# Patient Record
Sex: Female | Born: 1935
Health system: Southern US, Community
[De-identification: ages and names within clinical notes are randomized; demographics above are authoritative.]

## PROBLEM LIST (undated history)

## (undated) DIAGNOSIS — D496 Neoplasm of unspecified behavior of brain: Secondary | ICD-10-CM

## (undated) DIAGNOSIS — G4730 Sleep apnea, unspecified: Secondary | ICD-10-CM

## (undated) DIAGNOSIS — M1990 Unspecified osteoarthritis, unspecified site: Secondary | ICD-10-CM

## (undated) DIAGNOSIS — I10 Essential (primary) hypertension: Secondary | ICD-10-CM

## (undated) DIAGNOSIS — E039 Hypothyroidism, unspecified: Secondary | ICD-10-CM

## (undated) DIAGNOSIS — R002 Palpitations: Secondary | ICD-10-CM

## (undated) DIAGNOSIS — D329 Benign neoplasm of meninges, unspecified: Secondary | ICD-10-CM

## (undated) DIAGNOSIS — R600 Localized edema: Secondary | ICD-10-CM

## (undated) HISTORY — DX: Benign neoplasm of meninges, unspecified: D32.9

## (undated) HISTORY — PX: APPENDECTOMY: SHX54

## (undated) HISTORY — DX: Essential (primary) hypertension: I10

## (undated) HISTORY — PX: TONSILLECTOMY: SUR1361

## (undated) HISTORY — DX: Palpitations: R00.2

## (undated) HISTORY — DX: Neoplasm of unspecified behavior of brain: D49.6

## (undated) HISTORY — PX: TUBAL LIGATION: SHX77

## (undated) HISTORY — DX: Hypothyroidism, unspecified: E03.9

## (undated) HISTORY — DX: Sleep apnea, unspecified: G47.30

## (undated) HISTORY — DX: Localized edema: R60.0

---

## 2002-11-24 ENCOUNTER — Other Ambulatory Visit: Admission: RE | Admit: 2002-11-24 | Discharge: 2002-11-24 | Payer: Self-pay | Admitting: *Deleted

## 2004-09-06 ENCOUNTER — Encounter: Payer: Self-pay | Admitting: Cardiology

## 2004-09-06 ENCOUNTER — Inpatient Hospital Stay (HOSPITAL_COMMUNITY): Admission: EM | Admit: 2004-09-06 | Discharge: 2004-09-07 | Payer: Self-pay | Admitting: Emergency Medicine

## 2004-09-06 ENCOUNTER — Ambulatory Visit: Payer: Self-pay | Admitting: Cardiology

## 2008-03-23 ENCOUNTER — Encounter: Payer: Self-pay | Admitting: Internal Medicine

## 2008-03-26 ENCOUNTER — Encounter: Payer: Self-pay | Admitting: Internal Medicine

## 2008-03-26 ENCOUNTER — Encounter: Admission: RE | Admit: 2008-03-26 | Discharge: 2008-03-26 | Payer: Self-pay | Admitting: *Deleted

## 2008-04-16 ENCOUNTER — Other Ambulatory Visit: Admission: RE | Admit: 2008-04-16 | Discharge: 2008-04-16 | Payer: Self-pay | Admitting: Internal Medicine

## 2008-04-21 ENCOUNTER — Encounter: Payer: Self-pay | Admitting: Internal Medicine

## 2008-04-22 ENCOUNTER — Ambulatory Visit (HOSPITAL_BASED_OUTPATIENT_CLINIC_OR_DEPARTMENT_OTHER): Admission: RE | Admit: 2008-04-22 | Discharge: 2008-04-22 | Payer: Self-pay | Admitting: *Deleted

## 2008-04-22 ENCOUNTER — Encounter: Payer: Self-pay | Admitting: Internal Medicine

## 2008-04-22 DIAGNOSIS — G4730 Sleep apnea, unspecified: Secondary | ICD-10-CM

## 2008-04-22 HISTORY — DX: Sleep apnea, unspecified: G47.30

## 2008-04-23 ENCOUNTER — Encounter: Payer: Self-pay | Admitting: Internal Medicine

## 2008-04-24 ENCOUNTER — Ambulatory Visit: Payer: Self-pay | Admitting: Vascular Surgery

## 2008-04-26 ENCOUNTER — Ambulatory Visit: Payer: Self-pay | Admitting: Internal Medicine

## 2008-05-08 ENCOUNTER — Encounter: Payer: Self-pay | Admitting: Internal Medicine

## 2008-05-08 ENCOUNTER — Ambulatory Visit (HOSPITAL_BASED_OUTPATIENT_CLINIC_OR_DEPARTMENT_OTHER): Admission: RE | Admit: 2008-05-08 | Discharge: 2008-05-08 | Payer: Self-pay | Admitting: *Deleted

## 2008-05-09 ENCOUNTER — Ambulatory Visit: Payer: Self-pay | Admitting: Internal Medicine

## 2008-05-11 ENCOUNTER — Encounter: Payer: Self-pay | Admitting: Internal Medicine

## 2008-05-14 ENCOUNTER — Telehealth: Payer: Self-pay | Admitting: Internal Medicine

## 2008-05-15 ENCOUNTER — Ambulatory Visit: Payer: Self-pay | Admitting: Internal Medicine

## 2008-05-15 DIAGNOSIS — R0989 Other specified symptoms and signs involving the circulatory and respiratory systems: Secondary | ICD-10-CM

## 2008-05-15 DIAGNOSIS — R0609 Other forms of dyspnea: Secondary | ICD-10-CM

## 2008-05-18 ENCOUNTER — Telehealth: Payer: Self-pay | Admitting: Internal Medicine

## 2008-05-18 DIAGNOSIS — D332 Benign neoplasm of brain, unspecified: Secondary | ICD-10-CM | POA: Insufficient documentation

## 2008-05-18 DIAGNOSIS — IMO0002 Reserved for concepts with insufficient information to code with codable children: Secondary | ICD-10-CM | POA: Insufficient documentation

## 2008-05-18 DIAGNOSIS — E039 Hypothyroidism, unspecified: Secondary | ICD-10-CM

## 2008-05-18 DIAGNOSIS — R002 Palpitations: Secondary | ICD-10-CM | POA: Insufficient documentation

## 2008-05-18 DIAGNOSIS — F411 Generalized anxiety disorder: Secondary | ICD-10-CM | POA: Insufficient documentation

## 2008-05-23 ENCOUNTER — Emergency Department (HOSPITAL_COMMUNITY): Admission: EM | Admit: 2008-05-23 | Discharge: 2008-05-23 | Payer: Self-pay | Admitting: Emergency Medicine

## 2008-05-28 ENCOUNTER — Encounter: Payer: Self-pay | Admitting: Internal Medicine

## 2008-06-02 ENCOUNTER — Telehealth: Payer: Self-pay | Admitting: Internal Medicine

## 2008-06-03 DIAGNOSIS — G4733 Obstructive sleep apnea (adult) (pediatric): Secondary | ICD-10-CM | POA: Insufficient documentation

## 2008-06-15 ENCOUNTER — Encounter: Payer: Self-pay | Admitting: Internal Medicine

## 2008-06-16 ENCOUNTER — Telehealth: Payer: Self-pay | Admitting: Internal Medicine

## 2008-06-22 ENCOUNTER — Telehealth: Payer: Self-pay | Admitting: Internal Medicine

## 2008-06-30 ENCOUNTER — Telehealth (INDEPENDENT_AMBULATORY_CARE_PROVIDER_SITE_OTHER): Payer: Self-pay | Admitting: *Deleted

## 2008-07-02 ENCOUNTER — Encounter: Payer: Self-pay | Admitting: Internal Medicine

## 2008-07-13 ENCOUNTER — Telehealth (INDEPENDENT_AMBULATORY_CARE_PROVIDER_SITE_OTHER): Payer: Self-pay | Admitting: *Deleted

## 2010-04-02 ENCOUNTER — Encounter: Payer: Self-pay | Admitting: Internal Medicine

## 2010-06-23 LAB — POCT I-STAT, CHEM 8
HCT: 42 % (ref 36.0–46.0)
Hemoglobin: 14.3 g/dL (ref 12.0–15.0)
Sodium: 142 mEq/L (ref 135–145)
TCO2: 28 mmol/L (ref 0–100)

## 2010-07-26 NOTE — Procedures (Signed)
Brianna Reyes, Brianna Reyes                  ACCOUNT NO.:  [id number]   MEDICAL RECORD NO.:  [id number]          PATIENT TYPE:  OUT   LOCATION:  SLEEP CENTER                 FACILITY:  [facility]   PHYSICIAN:  Clinton D. Maple Hudson, MD, FCCP, FACPDATE OF BIRTH:  Jan 21, 1933   DATE OF STUDY:  05/08/2008                            NOCTURNAL POLYSOMNOGRAM   REFERRING PHYSICIAN:  Rosine Abe, M.D.   INDICATION FOR STUDY:  Insomnia with sleep apnea.   EPWORTH SLEEPINESS SCORE:  Epworth sleepiness score 3/24.  BMI 33.8.  Weight 197 pounds.  Height 64 inches.  Neck 14 inches.   HOME MEDICATION:  Charted and reviewed.   SLEEP ARCHITECTURE:  Diagnostic NPSG protocol ordered.  Total sleep time  107 minutes with sleep efficiency 25.7%.  Stage I was 1.5%.  Stage II  78.5%.  Stage III and REM were absent.  Sleep latency 109 minutes.  Awake after sleep onset 197 minutes.  Arousal index 6.2.  Sleep  architecture was marked by sleep onset after 2300 hours with repeated  intervals of sustained nonspecific wakefulness before final waking  around 04:30 a.m.  She took a natural sleep aid at 10:45 p.m.  She  reportedly had brought Valium, but chose not to take it.   RESPIRATORY DATA:  Apnea-hypopnea index (AHI) 0 per hour.  No scored  events were recorded.   OXYGEN DATA:  The study was reported on 2 liters per minute nasal oxygen  with humidity, as ordered.  Oxygen saturation at a nadir of 94% with  mean oxygen saturation 96.6% through the study.   CARDIAC DATA:  Sinus rhythm with occasional PAC.   MOVEMENT/PARASOMNIA:  No significant movement disturbance.  Bathroom x3.   IMPRESSION/RECOMMENDATIONS:  1. No significant respiratory events associated with sleep      disturbance.  Apnea-hypopnea index 0 per hour, (normal range 0-5      per hour).  Mild snoring with oxygen desaturation to 94% on      supplemental oxygen.  2. Baseline oxygen saturation on 2 liters per minute while awake was      97%.  The study was  done with the patient      wearing nasal oxygen, humidified, at 2 liters per minute as ordered      with oxygen desaturation to a nadir of 94% and mean oxygen      saturation 96.6%.      Clinton D. Maple Hudson, MD, [facility], FACP  Diplomate, Biomedical engineer of Sleep Medicine  Electronically Signed     CDY/MEDQ  D:  05/09/2008 11:42:56  T:  05/09/2008 78:29:56  Job:  [id number]

## 2010-07-26 NOTE — Procedures (Signed)
RENAL ARTERY DUPLEX EVALUATION   INDICATION:  Uncontrolled hypertension.   HISTORY:  Diabetes:  No.  Cardiac:  Arrhythmia.  Hypertension:  Yes.  Smoking:  No.   RENAL ARTERY DUPLEX FINDINGS:  Aorta-Proximal:  92 cm/s  Aorta-Mid:  92 cm/s  Aorta-Distal:  141 cm/s  Celiac Artery Origin:  164 cm/s  SMA Origin:  131 cm/s                                    RIGHT               LEFT  Renal Artery Origin:             108/29 cm/s         119/32 cm/s  Renal Artery Proximal:           141/36 cm/s         63/11 cm/s  Renal Artery Mid:                84/21 cm/s          57/17 cm/s  Renal Artery Distal:             55/13 cm/s          36/9 cm/s  Hilar Acceleration Time (AT):    m/s2                m/s2  Renal-Aortic Ratio (RAR):        1.5                 1.3  Kidney Size:                     10.8 cm x 4.7 cm    10.4 cm x 5.1 cm  End Diastolic Ratio (EDR):       0.34 to 0.26        0.26 to 0.27  Resistive Index (RI):            0.74                0.73   IMPRESSION:  1. Right kidney cyst measures 2.3 cm in diameter.  2. Renal to aortic ratio suggests less than 60% renal artery stenosis      bilaterally.  3. Left kidney is normal with respect to size and shape.  4. Right kidney is normal with respect to size, however, does have a      cyst.  5. Resistive indices suggests mild parenchymal disease.   ___________________________________________  Quita Skye. Hart Rochester, M.D.   MC/MEDQ  D:  04/24/2008  T:  04/24/2008  Job:  [id number]

## 2010-07-26 NOTE — Procedures (Signed)
Brianna Reyes, Brianna Reyes                  ACCOUNT NO.:  [id number]   MEDICAL RECORD NO.:  [id number]          PATIENT TYPE:  OUT   LOCATION:  SLEEP CENTER                 FACILITY:  [facility]   PHYSICIAN:  Clinton D. Maple Hudson, MD, FCCP, FACPDATE OF BIRTH:  01-Jul-1932   DATE OF STUDY:                            NOCTURNAL POLYSOMNOGRAM   REFERRING PHYSICIAN:  Rosine Abe, M.D.   INDICATIONS FOR STUDY:  Hypersomnia with sleep apnea.   EPWORTH SLEEPINESS SCORE:  Epworth sleepiness score 3/24, BMI 33.8,  weight 197 pounds, height 64 inches, and neck 14 inches.   MEDICATIONS:  Home medications charted and reviewed.   SLEEP ARCHITECTURE:  Total sleep time 184 minutes with sleep efficiency  51.3%.  Stage I was 11.9%, stage II 79.7%, stage III absent, REM 8.4% of  total sleep time.  Sleep latency 16 minutes, REM latency 305 minutes,  awake after sleep onset 159 minutes, arousal index 28.6.  No bedtime  medication was taken.  She had sustained intervals of wakefulness of  42/60 minutes between midnight in 1 a.m., between 1:30 and 2:30 a.m.,  and between 3:30 and 4 a.m. consistent with her described home pattern  of recurrent waking.   RESPIRATORY DATA:  Apnea/hypopnea index (AHI) 3.3 per hour.  A total of  10 events was scored including 1 mixed apnea and 9 hypopneas.  All  events were recorded as nonsupine.  She did not have enough events to  meet criteria for split study CPAP protocol on the study night.   OXYGEN DATA:  Mild to moderate snoring with oxygen desaturation to a  nadir of 86%.  Mean oxygen saturation through the study was 90.1% on  room air.  Total of 3.3 minutes was recorded with oxygen saturation less  than 88%.  Technician did not add supplemental oxygen.   CARDIAC DATA:  Sinus rhythm with PACs.   MOVEMENT/PARASOMNIA:  A total of 9 events were associated with arousal  awakening for an index of 2.9 per hour.   IMPRESSION/RECOMMENDATION:  1. Fragmented sleep with frequent  nonspecific waking.  Consider      managing this as an insomnia.  2. Occasional respiratory event with sleep disturbance, AHI 3.3 per      hour.  Events were mostly while nonsupine.  Mild to moderate      snoring with oxygen desaturation to a nadir of 86%.  3. Oxygen saturation nadir on room air was 86% and mean oxygen      saturation was only 90.1% with a total of 3.3      minutes, recorded time of saturation less than 88% on room air.      Consider evaluating for supplemental oxygen for home use especially      during sleep if clinically appropriate.      Clinton D. Maple Hudson, MD, [facility], FACP  Diplomate, Biomedical engineer of Sleep Medicine  Electronically Signed     CDY/MEDQ  D:  04/26/2008 13:51:04  T:  04/27/2008 00:43:49  Job:  [id number]

## 2010-07-29 NOTE — Discharge Summary (Signed)
Brianna Reyes, Brianna Reyes                  ACCOUNT NO.:  [id number]   MEDICAL RECORD NO.:  [id number]          PATIENT TYPE:  INP   LOCATION:  2001                         FACILITY:  [facility]   PHYSICIAN:  Mallory Shirk, MD     DATE OF BIRTH:  1930/07/18   DATE OF ADMISSION:  09/05/2004  DATE OF DISCHARGE:  09/07/2004                                 DISCHARGE SUMMARY   DISCHARGE DIAGNOSES:  1.  Meningioma.  2.  Hypertension.  3.  Hypothyroidism.  4.  Hyperlipidemia.  5.  Possible transient ischemic attack.   MEDICATIONS ON DISCHARGE:  1.  Lisinopril 20 mg p.o. daily.  2.  Levoxyl 75 mg p.o. daily.  3.  Zocor 20 mg p.o. daily.  4.  Enteric-coated aspirin 325 mg p.o. daily.   FOLLOWUP APPOINTMENTS:  1.  Dr. Merri Brunette, primary care physician, within two to three days of      discharge.  2.  Dr. Maeola Harman, neurosurgery. Please call Dr. Fredrich Birks office for an      appointment.   HISTORY OF PRESENT ILLNESS:  Brianna Reyes is a very pleasant 77 year old  Caucasian woman with a past medical history significant for hypertension and  hypothyroidism who developed some increased fatigue on the afternoon of September 05, 2004. The patient was seen completely normal around 7 a.m. the same day.  However, around 6 to 7 p.m. she developed some nausea, had some difficulty  arousing. The patient and husband called their son who was there in 10  minutes and called 9-1-1. The patient's symptoms resolved in the ambulance.  The patient is a known diabetic. She was evaluated in the emergency room,  initial CT scan of the brain showed a moderate size meningioma and was  admitted for further management.   PAST MEDICAL HISTORY:  1.  Hypertension.  2.  Hypothyroidism.  3.  History of tonsillectomy.   MEDICATIONS ON ADMISSION:  1.  Levoxyl 100 mcg p.o. daily.  2.  Lisinopril 10 mg p.o. daily.   ALLERGIES:  No known drug allergies.   PHYSICAL EXAMINATION:  VITAL SIGNS: On admission, vital signs revealed  blood  pressure of 190/94, pulse 70, respirations 20, saturations 93%.  HEENT: Normocephalic and atraumatic. PERRL. Sclerae anicteric. Mucous  membranes moist.  NECK: Supple with no LAD and no JVD.  LUNGS: Clear to auscultation bilaterally.  CVS: S1 and S2. Regular rate and rhythm.  ABDOMEN: Positive bowel sounds. Soft, nontender. No masses.  EXTREMITIES: No clubbing, cyanosis, or edema.   LABORATORY DATA:  WBC 8.0, hemoglobin 15.6, hematocrit 46, platelet count  273,000. Protime 13.6, INR 1.0, PTT 30. Sodium 136, potassium 3.7, chloride  103, glucose 119, BUN 17, creatinine 1.0, alkaline phosphatase 58, AST 23,  ALT 18, albumin 3.9, hemoglobin A1C 5.8, calcium 9.3, magnesium 2.4,  cholesterol 269, triglycerides 109, HDL 48, LDL 199, TSH 0.289.   Vitamin B12 level 632. Folate greater than 20. Urine drug screen negative.  Urinalysis negative. RPR nonreactive.   IMAGING:  CT of the head showed a mass over the high left frontal convexity  consistent with meningioma. MRI of brain showed a 2.5 x 2.4 x 3.5-cm left  posterior frontal parasagittal meningioma with extension into the superior  sagittal sinus. Compromise of lumen of the SSS, no thrombosis as yet. Mild  atrophy and small vessel disease. No evidence for acute stroke. Significant  vasogenic edema.   Echocardiogram showed overall normal left ventricular systolic function with  an ejection fraction of 55% to 65%. The study was inadequate for evaluation  of left ventricular regional wall motion.   Carotid Doppler showed no significant stenosis or plaque, mild calcific  plaques in the proximal ICA. No ICA stenosis. Vertebral artery flow was  antegrade.   HOSPITAL COURSE:  The patient was admitted to a monitored bed.   PROBLEM #1:  Meningioma. The patient was evaluated by Dr. Danae Orleans. Venetia Maxon,  neurosurgery. At the present time the patient is to have this excised at  some later date to be determined by Dr. Venetia Maxon and the family of  the patient  along with primary care physician, Dr. Merri Brunette. The patient's  completely resolved during hospital stay. There were no events of syncope or  altered consciousness. After all the studies were reviewed with the family,  they wanted to take the patient home. A neurology consult was offered which  the family declined at the present time. A discussion was held with Dr.  Renne Crigler, primary care physician, and he agrees with the plan.   PROBLEM #2:  Question of TIA. It is unclear if the patient had a TIA,  however symptoms could be consistent with that since they resolved within  less than a 24-hour period. The patient was started on aspirin.  A  discussion with Dr. Merri Brunette was also held regarding the treatment of  this. The patient has a physician in the family who was wondering if the  patient had a stroke. However, there was no evidence either by imaging or by  physical exam that the patient had any stroke that we could detect. Dr.  Renne Crigler agrees with starting treatment with aspirin for the present time. If  further evaluation is needed, Dr. Renne Crigler will arrange for the patient to be  seen at [facility] or another center for second opinion for surgery as needed as  well.   PROBLEM #3:  Hypertension. The patient is noncompliant with her medications.  She takes her blood pressure medications when she feels she has  hypertension. Unclear how she determines that. She was advised very strongly  to take her medications daily. Her lisinopril has been increased to 20 mg  p.o. daily upon discharge. She has also been advised to not take any herbal  medications since some of these tend to have ephedra which can lead to high  blood pressure. The patient verbalized understanding.   PROBLEM #4:  Hyperlipidemia. The patient's LDL was noted to be 199 with a  total cholesterol of 269, triglycerides 109. The patient has been started on Zocor at 20 mg p.o. daily. The patient verbalized concerns  about Zocor side  effects that she saw on TV. She has been advised to follow up very closely  with Dr. Renne Crigler and further adjustment of Zocor and dose will be done by Dr.  Renne Crigler.   PROBLEM #5:  Hypothyroidism. The patient's TSH was 0.289. Her Synthroid dose  was changed from 100 mcg to 75 mcg p.o. daily. Further evaluation of her  hypothyroidism is deferred to Dr. Renne Crigler.   PROBLEM #6:  The patient was advised  to keep her followup appointments, take  her medications as prescribed, and to return to the emergency department  immediately upon onset of dizziness, chest pain, shortness of breath, or any  other symptoms that might need immediate medical attention.       GDK/MEDQ  D:  09/07/2004  T:  09/07/2004  Job:  [id number]   cc:   Soyla Murphy. Renne Crigler, M.D.  [address]  [place]  [place] [address]  Fax: [phone]

## 2010-07-29 NOTE — Procedures (Signed)
REQUESTING PHYSICIAN:  Isidor Holts, M.D.   PROCEDURES:  Routine EEG done with photic stimulation and hyperventilation.  The patient is described as awake and alert.   CLINICAL HISTORY:  This is a 77 year old woman with history of transient  confusion.  EEG is performed for evaluation of possible seizure.   DESCRIPTION:  The predominant rhythm tracing is seen only in small fragments  and is a low amplitude alpha rhythm of approximately 11 Hertz which  predominates posteriorly and appears without abnormal asymmetry.  Abundant  low amplitude fast activity is seen diffusely throughout the record which  appears without abnormal asymmetry.  No focal slowing is noted.  No  epileptiform discharges are seen.  The patient appeared to have remained in  the wake state throughout the recording.  Photic stimulation produced weak  driving responses.  Hyperventilation produced a little bit more prominence  of the background alpha rhythm with no appearance of abnormalities.  Single  channel devoted to EKG reveals sinus rhythm throughout with a rate of  approximately 60 beats per minutes.   CONCLUSION:  Essentially normal study in the wake state.  Excessive fast  activity as seen in this tracing can be seen in anxious individuals or those  taking medications of the sedative/hypnotic class.  Correlate clinically.  No focal slowing is noted, and no epileptiform discharges are seen.       WUJ:WJXB  D:  09/06/2004 17:39:03  T:  09/07/2004 07:00:23  Job #:  [id number]

## 2010-07-29 NOTE — Consult Note (Signed)
Brianna Reyes, Brianna Reyes                  ACCOUNT NO.:  [id number]   MEDICAL RECORD NO.:  [id number]          PATIENT TYPE:  INP   LOCATION:  2001                         FACILITY:  [facility]   PHYSICIAN:  Danae Orleans. Venetia Maxon, M.D.  DATE OF BIRTH:  [birth year]   DATE OF CONSULTATION:  09/06/2004  DATE OF DISCHARGE:                                   CONSULTATION   REASON FOR CONSULTATION:  Disorientation yesterday evening with newly  diagnosed brain tumor.   HISTORY OF PRESENT ILLNESS:  Brianna Reyes is a 77 year old right-handed woman  with a history of hypertension and hypothyroidism with an episode yesterday  p.m. of disorientation and abnormal speech which was characterized by  receptive aphasia and babbling.  This was self-limited.  The patient's  husband discovered the patient and called an ambulance.  She was brought to  [facility].  She was amnestic for the events and does not recall  the ambulance ride.  This was associated with nausea and vomiting.  She had  no epileptiform events noted or abnormal movements.  She is currently  without complaints.  She does still feel vague nausea.  She denies any  headache.  Head CT demonstrated a left parasagittal meningioma and MRI  confirms a meningioma without significant edema, but with significant local  mass effect.  It abuts the superior sagittal sinus without invasion of the  sinus.  There is no significant shift.   PAST MEDICAL HISTORY:  1.  Hypertension.  2.  Hypothyroidism.   She is on Levoxyl and lisinopril.   REVIEW OF SYSTEMS:  Otherwise unremarkable.  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  She is a nondrinker of alcoholic  beverages and does not smoke tobacco products.   PHYSICAL EXAMINATION:  GENERAL:  She is awake, alert, and conversant.  Speech is clear and fluent speech.  NEUROLOGIC:  Pupils are equal, round, and reactive to light.  Extraocular  movements are intact.  She has no diplopia.  Her  facial motor and sensation  are intact and symmetric.  She has no pronator drift.  She has 5/5 strength  in all motor groups in bilateral upper extremities.  No sensory  abnormalities appreciated.  Cerebellar examination is normal.  Reflexes are  2 in the biceps, triceps, and brachioradialis, 2 at the knees, 2 at the  ankles.  Greater toes are downgoing to plantar stimulation.  She has no  carotid bruits appreciated.  CHEST:  Clear to auscultation.  HEART:  Regular rate and rhythm without murmur.  ABDOMEN:  Soft, nontender with active bowel sounds.   IMPRESSION:  Brianna Reyes is a 77 year old woman with a syncopal event of  unclear etiology.  This may have been a seizure, although there was no motor  findings and tumor is primarily centered over the motor strip.  This  possibly represents an unrelated a transient ischemic attack.  The patient  has a large left parasagittal meningioma  with mass effect.  I would recommend work-up of the syncopal episode with a  carotid duplex  and EEG.  The patient will require craniotomy with resection  of this meningioma in the not too distant future.  I discussed this with the  patient's family and will follow the patient along.       JDS/MEDQ  D:  09/06/2004  T:  09/06/2004  Job:  [id number]

## 2010-07-29 NOTE — Procedures (Signed)
NAMEPINKIE, MANGER                  ACCOUNT NO.:  [id number]   MEDICAL RECORD NO.:  [id number]         PATIENT TYPE:  OUT   LOCATION:  SLEEP CENTER                 FACILITY:  [facility]   PHYSICIAN:  Clinton D. Maple Hudson, MD, FCCP, FACPDATE OF BIRTH:  Sep 30, 1932   DATE OF STUDY:  05/28/2008                            NOCTURNAL POLYSOMNOGRAM   REFERRING PHYSICIAN:  Clinton D. Young, MD, FCCP, FACP   EPWORTH SLEEPINESS SCORE:  Epworth sleepiness score 3/24.  BMI 33.8.  Weight 197 pounds.  Height 64 inches.  Neck 14 inches.   MEDICATIONS:  Home medications are charted and reviewed.   SLEEP ARCHITECTURE:  This was a home study with [employer] portable  equipment recording oxygen saturation respiratory effort with chest and  abdominal excursion, snoring detection, apnea and hypopnea detection,  cardiac rhythm, and the body position.  This was not a technician  monitored study.   RESPIRATORY DATA:  Total sleep time 701.9 minutes.  Apnea-hypopnea index  (AHI) 16.2 per hour.  A 94.9% of total sleep time was recorded with the  patient sleeping supine.  Average oxygen saturation wearing 2 L per  minute nasal prongs was 94.8%.  Minimal oxygen desaturation was recorded  below 81% and virtually all events maintain oxygen saturation greater  than 90% on supplemental oxygen.   HEART RATE:  Mean heart rate 86.9 per minute with minimal of 60 per  minute, maximum 135 beats per minute.  Snoring time 0.   IMPRESSION/RECOMMENDATIONS:  1. Apnea-hypopnea index of 16.2 per hour is consistent with a      diagnosis of moderately severe obstructive sleep apnea/hypopnea      syndrome.  No snoring was recorded and mean oxygen saturation held      94.8% on supplemental oxygen.  2. Previous sleep center monitored studies were compared.  On April 22, 2008, AHI 3.3 per hour with very fragmented sleep and frequent      waking.  On May 08, 2008, apnea-hypopnea index was 0 per hour,      recorded on 2 L per  minute nasal prongs with only 107 minutes of      sleep recorded and sleep efficiency of 25.5%, reflecting anxiety      and inability to maintain sleep.  3. An apnea-hypopnea index of 16.2 per hour would justify therapeutic      trial with continuous positive airway pressure.  This can be      considered clinically.      Clinton D. Maple Hudson, MD, [facility] FACP  Diplomate, Biomedical engineer of Sleep Medicine  Electronically Signed     CDY/MEDQ  D:  05/30/2008 12:10:36  T:  05/31/2008 04:29:44  Job:  [id number]

## 2010-07-29 NOTE — H&P (Signed)
Brianna Reyes, Brianna Reyes                  ACCOUNT NO.:  [id number]   MEDICAL RECORD NO.:  [id number]          PATIENT TYPE:  EMS   LOCATION:  MAJO                         FACILITY:  [facility]   PHYSICIAN:  Hettie Holstein, D.O.    DATE OF BIRTH:  04-07-1930   DATE OF ADMISSION:  09/05/2004  DATE OF DISCHARGE:                                HISTORY & PHYSICAL   PRIMARY CARE PHYSICIAN:  Soyla Murphy. Renne Crigler, M.D.   CHIEF COMPLAINT:  Confusion.   HISTORY OF PRESENT ILLNESS:  Brianna Reyes is a 77 year old Caucasian female  with an unremarkable past medical history only significant for hypertension  and hypothyroidism. She, around 3 o'clock, developed some increased fatigue  and went to bed early. She was last seen as completely normal around 7 a.m.  that morning; however, around 6 to 7 p.m., she developed some nausea and  subsequently some difficulty arousing. Therefore, her husband called their  son who was there in 10 minutes and dispatched the ambulance. Most symptoms  resolved in the ambulance, however. In any event, she is known diabetic, has  no significant medical history. She was evaluated in the emergency  department with an initial CT scan of the brain showing a moderate size  meningioma. She is being admitted for further imaging, management, and  consultation with neurology for final disposition on this finding.   The family requests the neurosurgical services here to be involved as they  are acquainted with Clearence Ped of the service and anticipate contacting  them based on neurologist recommendation.   PAST MEDICAL HISTORY:  1.  Hypertension.  2.  Hypothyroidism.  3.  History of tonsillectomy in the past.   MEDICATIONS:  1.  Levoxyl 100 mcg daily.  2.  Lisinopril 10 mg daily.   ALLERGIES:  No known drug allergies.   SOCIAL HISTORY:  Brianna Reyes has four children. She lives with her husband at  home. She was formerly a Scientist, water quality. She denies tobacco or alcohol. She  is currently  in the middle of publishing a book.   FAMILY HISTORY:  Noncontributory.   LABORATORY DATA:  CT scan as described above revealing a 2.4 x 3.2 cm mass  over the high left convexity compatible with a meningioma. Urinalysis was  negative. Creatinine was 1.0. Hemoglobin 14.5. No other studies were  performed in the department.   ASSESSMENT:  1.  Brain mass/questionable meningioma.  2.  Transient neurologic changes perhaps secondary to the above.  3.  Obesity.   PLAN:  At this time, we are going to admit Brianna Reyes for further neurologic  evaluation and imaging as described above.       ESS/MEDQ  D:  09/06/2004  T:  09/06/2004  Job:  [id number]   cc:   Soyla Murphy. Renne Crigler, M.D.  [address]  [place]  [place] [address]  Fax: [phone]

## 2012-06-13 ENCOUNTER — Emergency Department (HOSPITAL_COMMUNITY): Payer: Medicare Other

## 2012-06-13 ENCOUNTER — Encounter (HOSPITAL_COMMUNITY): Payer: Self-pay | Admitting: Emergency Medicine

## 2012-06-13 ENCOUNTER — Emergency Department (HOSPITAL_COMMUNITY)
Admission: EM | Admit: 2012-06-13 | Discharge: 2012-06-14 | Disposition: A | Discharge status: Home / Self Care | Payer: Medicare Other | Attending: Emergency Medicine | Admitting: Emergency Medicine

## 2012-06-13 DIAGNOSIS — E039 Hypothyroidism, unspecified: Secondary | ICD-10-CM | POA: Insufficient documentation

## 2012-06-13 DIAGNOSIS — Z79899 Other long term (current) drug therapy: Secondary | ICD-10-CM | POA: Insufficient documentation

## 2012-06-13 DIAGNOSIS — R142 Eructation: Secondary | ICD-10-CM | POA: Insufficient documentation

## 2012-06-13 DIAGNOSIS — R141 Gas pain: Secondary | ICD-10-CM | POA: Insufficient documentation

## 2012-06-13 DIAGNOSIS — R140 Abdominal distension (gaseous): Secondary | ICD-10-CM

## 2012-06-13 DIAGNOSIS — G4700 Insomnia, unspecified: Secondary | ICD-10-CM | POA: Insufficient documentation

## 2012-06-13 DIAGNOSIS — I10 Essential (primary) hypertension: Secondary | ICD-10-CM | POA: Insufficient documentation

## 2012-06-13 DIAGNOSIS — Z8679 Personal history of other diseases of the circulatory system: Secondary | ICD-10-CM | POA: Insufficient documentation

## 2012-06-13 DIAGNOSIS — Z86011 Personal history of benign neoplasm of the brain: Secondary | ICD-10-CM | POA: Insufficient documentation

## 2012-06-13 LAB — COMPREHENSIVE METABOLIC PANEL
ALT: 20 U/L (ref 0–35)
AST: 28 U/L (ref 0–37)
Albumin: 3.9 g/dL (ref 3.5–5.2)
Alkaline Phosphatase: 77 U/L (ref 39–117)
Calcium: 10.2 mg/dL (ref 8.4–10.5)
GFR calc Af Amer: 65 mL/min — ABNORMAL LOW (ref >90)
Glucose, Bld: 97 mg/dL (ref 70–99)
Potassium: 3.8 mEq/L (ref 3.5–5.1)
Sodium: 136 mEq/L (ref 135–145)
Total Protein: 7.4 g/dL (ref 6.0–8.3)

## 2012-06-13 LAB — CBC WITH DIFFERENTIAL/PLATELET
Basophils Absolute: 0 10*3/uL (ref 0.0–0.1)
Eosinophils Absolute: 0.3 10*3/uL (ref 0.0–0.7)
Eosinophils Relative: 4 % (ref 0–5)
Lymphs Abs: 2.3 10*3/uL (ref 0.7–4.0)
MCH: 30.6 pg (ref 26.0–34.0)
Neutrophils Relative %: 53 % (ref 43–77)
Platelets: 217 10*3/uL (ref 150–400)
RBC: 4.25 MIL/uL (ref 3.87–5.11)
RDW: 14.9 % (ref 11.5–15.5)
WBC: 7 10*3/uL (ref 4.0–10.5)

## 2012-06-13 NOTE — ED Provider Notes (Signed)
History     CSN: [id number]  Arrival date & time 06/13/12  2153   First MD Initiated Contact with Patient 06/13/12 2304      Chief Complaint  Patient presents with  . Bloated     The history is provided by the patient.   "abdominal thumping" Onset - 2 days ago Intermittent course Course is stable Nothing worsens symptoms Nothing improves symptoms  Pt presents with abdominal thumping She denies cp/sob/weakness No abdominal pain No back pain.  No fever No vomiting.  No change in bowel movements.  No rectal bleeding She has never had this before.  She called her daughter about this.  She looked it up on internet and told her it could be a ruptured aneurysm.  She has no known h/o AAA      Past Medical History  Diagnosis Date  . HTN (hypertension)   . Brain tumor   . Hypothyroidism   . Sleep apnea   . Palpitations   . Meningioma     Past Surgical History  Procedure Laterality Date  . Appendectomy    . Tonsillectomy    . Tubal ligation      Family History  Problem Relation Age of Onset  . Heart disease Brother     ischemic  . Hypertension    . Stroke      History  Substance Use Topics  . Smoking status: Never Smoker   . Smokeless tobacco: Not on file  . Alcohol Use: No    OB History   Grav Para Term Preterm Abortions TAB SAB Ect Mult Living                  Review of Systems  Constitutional: Negative for fever.  Respiratory: Negative for shortness of breath.   Cardiovascular: Negative for chest pain.  Gastrointestinal: Negative for vomiting, abdominal pain and blood in stool.  Musculoskeletal: Negative for back pain.  Neurological: Negative for weakness.  Psychiatric/Behavioral: Negative for agitation.  All other systems reviewed and are negative.    Allergies  Review of patient's allergies indicates no known allergies.  Home Medications   Current Outpatient Rx  Name  Route  Sig  Dispense  Refill  . Ascorbic Acid (VITAMIN C PO)  Oral   Take by mouth.         . beta carotene w/minerals (OCUVITE) tablet   Oral   Take 1 tablet by mouth daily.         Marland Kitchen co-enzyme Q-10 30 MG capsule   Oral   Take 30 mg by mouth 3 (three) times daily.         . Ginger, Zingiber officinalis, (GINGER PO)   Oral   Take by mouth.         . levothyroxine (SYNTHROID, LEVOTHROID) 88 MCG tablet   Oral   Take 88 mcg by mouth daily before breakfast.         . lisinopril-hydrochlorothiazide (PRINZIDE,ZESTORETIC) 20-12.5 MG per tablet   Oral   Take 1 tablet by mouth 2 (two) times daily.          Marland Kitchen MANGANESE PO   Oral   Take by mouth.         . metoprolol (LOPRESSOR) 50 MG tablet   Oral   Take 75 mg by mouth 2 (two) times daily.         Marland Kitchen omega-3 acid ethyl esters (LOVAZA) 1 G capsule   Oral   Take 2 g by  mouth 2 (two) times daily.         Marland Kitchen OVER THE COUNTER MEDICATION   Oral   Take 2 tablets by mouth every morning. Alleviate-natural otc medication for joints         . OVER THE COUNTER MEDICATION   Oral   Take 1 tablet by mouth every morning. Herbal supplements         . OVER THE COUNTER MEDICATION   Oral   Take 1 tablet by mouth every morning. Willow Bark           BP 160/56  Pulse 60  Temp(Src) 97.6 F (36.4 C) (Oral)  Resp 16  SpO2 97%  Physical Exam CONSTITUTIONAL: Well developed/well nourished HEAD: Normocephalic/atraumatic EYES: EOMI/PERRL ENMT: Mucous membranes moist NECK: supple no meningeal signs SPINE:entire spine nontender CV: S1/S2 noted, no murmurs/rubs/gallops noted LUNGS: Lungs are clear to auscultation bilaterally, no apparent distress ABDOMEN: soft, nontender, no rebound or guarding. No abdominal bruit.  No pulsatile mass +BS noted in all quadrants GU:no cva tenderness NEURO: Pt is awake/alert, moves all extremitiesx4 EXTREMITIES: pulses normal, full ROM SKIN: warm, color normal PSYCH: no abnormalities of mood noted  ED Course  Procedures (including critical care  time)  Labs Reviewed  COMPREHENSIVE METABOLIC PANEL - Abnormal; Notable for the following:    BUN 28 (*)    Total Bilirubin 0.2 (*)    GFR calc non Af Amer 56 (*)    GFR calc Af Amer 65 (*)    All other components within normal limits  CBC WITH DIFFERENTIAL  POCT I-STAT TROPONIN I   Dg Abd Acute W/chest  06/13/2012  *RADIOLOGY REPORT*  Clinical Data: Bloated  ACUTE ABDOMEN SERIES (ABDOMEN 2 VIEW & CHEST 1 VIEW)  Comparison: None.  Findings: Normal heart size.  Bibasilar atelectasis.  No free intraperitoneal gas.  No disproportionate dilatation of bowel. Vascular calcifications.  IMPRESSION: Bibasilar atelectasis.  Nonobstructive bowel gas pattern.   Original Report Authenticated By: Jolaine Click, M.D.    Pt well appearing She has no tenderness and I doubt she has acute ruptured AAA given her exam.  Advised need to call her PCP tomorrow and can further pursue workup as outpatient.  Patient agreeable   MDM  Nursing notes including past medical history and social history reviewed and considered in documentation xrays reviewed and considered Labs/vital reviewed and considered        Date: 06/13/2012  Rate: 57  Rhythm: sinus bradycardia  QRS Axis: normal  Intervals: normal  ST/T Wave abnormalities: normal  Conduction Disutrbances:none  Narrative Interpretation:   Old EKG Reviewed: none available AT TIME OF INTERPRETATION    Joya Gaskins, MD 06/14/12 0003

## 2012-06-13 NOTE — ED Notes (Signed)
PT. REPORTS ABDOMINAL " THUMPING" SENSATION / FEELS FULL FOR SEVERAL DAYS , DENIES CHEST PAIN , NO SOB , NO NAUSEA OR VOMITTING .

## 2012-06-14 NOTE — ED Notes (Signed)
Pt denies any pain or questions upon discharge. 

## 2012-08-21 ENCOUNTER — Encounter: Payer: Self-pay | Admitting: *Deleted

## 2012-08-21 ENCOUNTER — Telehealth: Payer: Self-pay | Admitting: *Deleted

## 2012-08-21 NOTE — Telephone Encounter (Signed)
Spoke with patient informing her that the last download that we received from [facility] shows tat she her sleep apnea is still not controlled. We ned for her to come to the June 17th sleep clinic. Spoke with Inetta Fermo to have her to put patient on the schedule @ 9:30. Patient was instructed to bring her sleep machine.

## 2012-08-26 ENCOUNTER — Encounter: Payer: Self-pay | Admitting: Cardiovascular Disease

## 2012-08-27 ENCOUNTER — Ambulatory Visit (INDEPENDENT_AMBULATORY_CARE_PROVIDER_SITE_OTHER): Payer: Medicare Other | Admitting: Cardiovascular Disease

## 2012-08-27 ENCOUNTER — Encounter: Payer: Self-pay | Admitting: Cardiovascular Disease

## 2012-08-27 VITALS — BP 148/80 | HR 62 | Ht 64.0 in | Wt 205.6 lb

## 2012-08-27 DIAGNOSIS — R002 Palpitations: Secondary | ICD-10-CM

## 2012-08-27 DIAGNOSIS — G4733 Obstructive sleep apnea (adult) (pediatric): Secondary | ICD-10-CM

## 2012-08-27 DIAGNOSIS — IMO0002 Reserved for concepts with insufficient information to code with codable children: Secondary | ICD-10-CM

## 2012-08-27 NOTE — Patient Instructions (Signed)
Your physician recommends that you schedule a follow-up appointment in: 6 MONTHS 

## 2012-08-27 NOTE — Progress Notes (Signed)
Patient ID: Brianna Reyes, female   DOB: 09-Mar-1931, 79 y.o.   MRN: [id number]   HPI: Brianna Reyes, is a 79 y.o. female who presents to the office today for sleep clinic evaluation.  Brianna Reyes has a history of complex sleep apnea, palpitations, hypertension, hyperlipidemia, hypothyroidism, as well as obesity. She has been meeting Medicare compliance standard was not doing well until she was switched to BiPAP therapy for her sleep apnea appear most recently, she has been set at a fixed BiPAP pressure of 22 and an EPAP pressure of 17. Clinically, she notes dramatic improvement from how she felt before. She states that she is no longer having nocturnal tachycardia palpitations. She denies residual daytime sleepiness. She has poor energy. She is unaware of any significant leak or breakthrough snoring. A new download was obtained from 06/27/2012 through 08/21/2012. She is using a 100% of the days and averaging almost 7 hours per night. However, despite her marked improvement clinically and subjectively, objective leak on his download her AHI was still elevated but improved at 25.2. Interrogation of her BiPAP although filled for his chronic unit goals that she is starting at 15/10 with her initial ramp pressure and then it increases to the fixed settings.    Epworth Sleepiness Scale: Situation   Chance of Dozing/Sleeping (0 = never , 1 = slight chance , 2 = moderate chance , 3 = high chance )   sitting and reading 0   watching TV 0   sitting inactive in a public place 0   being a passenger in a motor vehicle for an hour or more 1   lying down in the afternoon 2   sitting and talking to someone 0   sitting quietly after lunch (no alcohol) 0   while stopped for a few minutes in traffic as the driver 0   Total Score  3    Past Medical History  Diagnosis Date  . Brain tumor   . Hypothyroidism   . Sleep apnea 04/22/08    SLEEP STUDY done @ [facility]. Read by Dr. Fannie Reyes. Repeat study done  05/22/11 @ [facility]  . Palpitations 10/24/11    cardionet monitor worn  . Meningioma   . HTN (hypertension) 10/24/11    ECHO; EF=> 55% tHERE IS MILD CONCENTRIC LV HYPERTROPHY. LV SYSTOLIC FUNCTION IS NORMAL. THE TRANSMITRAL SPECTRAL DOPPLER FLOW PATTERN SUGGESTIVE OF PSEUDONORMALIATION.    Past Surgical History  Procedure Laterality Date  . Appendectomy    . Tonsillectomy    . Tubal ligation      No Known Allergies  Current Outpatient Prescriptions  Medication Sig Dispense Refill  . Ascorbic Acid (VITAMIN C PO) Take by mouth.      . beta carotene w/minerals (OCUVITE) tablet Take 1 tablet by mouth daily.      Marland Kitchen co-enzyme Q-10 30 MG capsule Take 30 mg by mouth 3 (three) times daily.      . Ginger, Zingiber officinalis, (GINGER PO) Take by mouth.      . hydrALAZINE (APRESOLINE) 25 MG tablet 25 mg. Take 1/2-1 po prn      . levothyroxine (SYNTHROID, LEVOTHROID) 88 MCG tablet Take 88 mcg by mouth daily before breakfast.      . lisinopril-hydrochlorothiazide (PRINZIDE,ZESTORETIC) 20-12.5 MG per tablet Take 1 tablet by mouth 2 (two) times daily.       Marland Kitchen MANGANESE PO Take by mouth.      . metoprolol (LOPRESSOR)  50 MG tablet Take 75 mg by mouth 2 (two) times daily.      Marland Kitchen omega-3 acid ethyl esters (LOVAZA) 1 G capsule Take 2 g by mouth 2 (two) times daily.      Marland Kitchen OVER THE COUNTER MEDICATION Take 2 tablets by mouth every morning. Alleviate-natural otc medication for joints      . OVER THE COUNTER MEDICATION Take 1 tablet by mouth every morning. Herbal supplements      . OVER THE COUNTER MEDICATION Take 1 tablet by mouth every morning. [facility]       No current facility-administered medications for this visit.    Social she is widowed. She has 4 children 6 grandchildren. There is no tobacco row call use. She does not routinely exercise.  ROS is negative for fever chills or night sweats. She denies any residual daytime sleepiness. She is unaware of breakthrough  snoring. She denies bruxism. She denies restless legs. She denies any recent palpitations. She states her blood pressure at home typically is in the normal range at approximately 110-120 systolically. She has more energy she denies tremors. She denies rash. Other system review is negative.  PE BP 148/80  Pulse 62  Ht 5\' 4"  (1.626 m)  Wt 205 lb 9.6 oz (93.26 kg)  BMI 35.27 kg/m2  General: Alert, oriented, no distress.  HEENT: Normocephalic, atraumatic. Pupils round and reactive; sclera anicteric;  Nose without nasal septal hypertrophy Mouth/Parynx benign; Mallinpatti scale 3 Neck: No JVD, no carotid briuts Lungs: clear to ausculatation and percussion; no wheezing or rales Heart: RRR, s1 s2 normal 1/6 systolic murmur Abdomen: soft, nontender; no hepatosplenomehaly, BS+; abdominal aorta nontender and not dilated by palpation. Pulses 2+ Extremities: no clubbinbg cyanosis or edema, Homan's sign negative  Neurologic: grossly nonfocal    LABS:  BMET    Component Value Date/Time   NA 136 06/13/2012 2218   K 3.8 06/13/2012 2218   CL 99 06/13/2012 2218   CO2 27 06/13/2012 2218   GLUCOSE 97 06/13/2012 2218   BUN 28* 06/13/2012 2218   CREATININE 0.94 06/13/2012 2218   CALCIUM 10.2 06/13/2012 2218   GFRNONAA 56* 06/13/2012 2218   GFRAA 65* 06/13/2012 2218     Hepatic Function Panel     Component Value Date/Time   PROT 7.4 06/13/2012 2218   ALBUMIN 3.9 06/13/2012 2218   AST 28 06/13/2012 2218   ALT 20 06/13/2012 2218   ALKPHOS 77 06/13/2012 2218   BILITOT 0.2* 06/13/2012 2218     CBC    Component Value Date/Time   WBC 7.0 06/13/2012 2218   RBC 4.25 06/13/2012 2218   HGB 13.0 06/13/2012 2218   HCT 38.0 06/13/2012 2218   PLT 217 06/13/2012 2218   MCV 89.4 06/13/2012 2218   MCH 30.6 06/13/2012 2218   MCHC 34.2 06/13/2012 2218   RDW 14.9 06/13/2012 2218   LYMPHSABS 2.3 06/13/2012 2218   MONOABS 0.7 06/13/2012 2218   EOSABS 0.3 06/13/2012 2218   BASOSABS 0.0 06/13/2012 2218     BNP No results found for this basename:  probnp    Lipid Panel  No results found for this basename: chol, trig, hdl, cholhdl, vldl, ldlcalc     RADIOLOGY: No results found.    ASSESSMENT AND PLAN: I reviewed Brianna Reyes most recent download it appears that she did develop 3 central apneic events and her pressure of 22/17. Although or HI is elevated clinically she feels markedly improved and notes tremendous difference on her current  BiPAP when compared to her CPAP therapy. We did discuss potential alternatives to treatment. At this point, it does not appear that she is having any significant mask leak to account for her elevated AHI. I elected to further adjust her pressure support delta and will change her to a BiPAP although rather than fixed pressure settings. We will start her at 16/10 this can increase to a maximum if necessary of 25/19. We will obtain a new download in 30 days. Hopefully this will objectively show improvement. However, if she is noted to have central events continued elevation of her HI, she will then be referred for a BiPAP Auto SV titration.  Her blood pressure again show some lability she states her blood pressure at home has been in the 110-120 range systolically. Her palpitations have resolved with treatment. As long as she remains stable, I will see her in 6 months for reassessment     Lennette Bihari, MD, [facility]  08/27/2012 11:03 AM

## 2012-09-16 ENCOUNTER — Other Ambulatory Visit: Payer: Self-pay

## 2012-09-16 MED ORDER — METOPROLOL TARTRATE 50 MG PO TABS: 75.0000 mg | ORAL_TABLET | Freq: Two times a day (BID) | ORAL | Status: DC

## 2012-09-16 NOTE — Telephone Encounter (Signed)
Rx was sent to pharmacy electronically. 

## 2012-09-18 ENCOUNTER — Encounter (HOSPITAL_COMMUNITY): Payer: Self-pay | Admitting: Emergency Medicine

## 2012-09-18 DIAGNOSIS — A498 Other bacterial infections of unspecified site: Secondary | ICD-10-CM | POA: Diagnosis present

## 2012-09-18 DIAGNOSIS — M129 Arthropathy, unspecified: Secondary | ICD-10-CM | POA: Diagnosis present

## 2012-09-18 DIAGNOSIS — E876 Hypokalemia: Secondary | ICD-10-CM | POA: Diagnosis present

## 2012-09-18 DIAGNOSIS — E039 Hypothyroidism, unspecified: Secondary | ICD-10-CM | POA: Diagnosis present

## 2012-09-18 DIAGNOSIS — N12 Tubulo-interstitial nephritis, not specified as acute or chronic: Principal | ICD-10-CM | POA: Diagnosis present

## 2012-09-18 DIAGNOSIS — Z79899 Other long term (current) drug therapy: Secondary | ICD-10-CM

## 2012-09-18 DIAGNOSIS — Z823 Family history of stroke: Secondary | ICD-10-CM

## 2012-09-18 DIAGNOSIS — Z8249 Family history of ischemic heart disease and other diseases of the circulatory system: Secondary | ICD-10-CM

## 2012-09-18 DIAGNOSIS — E871 Hypo-osmolality and hyponatremia: Secondary | ICD-10-CM | POA: Diagnosis present

## 2012-09-18 DIAGNOSIS — G4730 Sleep apnea, unspecified: Secondary | ICD-10-CM | POA: Diagnosis present

## 2012-09-18 DIAGNOSIS — I10 Essential (primary) hypertension: Secondary | ICD-10-CM | POA: Diagnosis present

## 2012-09-18 DIAGNOSIS — D320 Benign neoplasm of cerebral meninges: Secondary | ICD-10-CM | POA: Diagnosis present

## 2012-09-18 LAB — COMPREHENSIVE METABOLIC PANEL
ALT: 21 U/L (ref 0–35)
AST: 24 U/L (ref 0–37)
CO2: 27 mEq/L (ref 19–32)
Calcium: 9.3 mg/dL (ref 8.4–10.5)
Chloride: 96 mEq/L (ref 96–112)
Creatinine, Ser: 0.89 mg/dL (ref 0.50–1.10)
GFR calc Af Amer: 69 mL/min — ABNORMAL LOW (ref >90)
GFR calc non Af Amer: 60 mL/min — ABNORMAL LOW (ref >90)
Glucose, Bld: 110 mg/dL — ABNORMAL HIGH (ref 70–99)
Total Bilirubin: 0.6 mg/dL (ref 0.3–1.2)

## 2012-09-18 LAB — CBC WITH DIFFERENTIAL/PLATELET
Basophils Absolute: 0 10*3/uL (ref 0.0–0.1)
Eosinophils Relative: 1 % (ref 0–5)
HCT: 37.4 % (ref 36.0–46.0)
Hemoglobin: 12.7 g/dL (ref 12.0–15.0)
Lymphocytes Relative: 7 % — ABNORMAL LOW (ref 12–46)
Lymphs Abs: 0.6 10*3/uL — ABNORMAL LOW (ref 0.7–4.0)
MCV: 91.2 fL (ref 78.0–100.0)
Monocytes Absolute: 0.6 10*3/uL (ref 0.1–1.0)
Neutro Abs: 6.5 10*3/uL (ref 1.7–7.7)
RBC: 4.1 MIL/uL (ref 3.87–5.11)
RDW: 14.4 % (ref 11.5–15.5)
WBC: 7.7 10*3/uL (ref 4.0–10.5)

## 2012-09-18 MED ORDER — ACETAMINOPHEN 325 MG PO TABS
650.0000 mg | ORAL_TABLET | Freq: Once | ORAL | Status: AC
  Administered 2012-09-18: 650 mg via ORAL
  Filled 2012-09-18: qty 2

## 2012-09-18 NOTE — ED Notes (Signed)
PT. REPORTS FEVER WITH CHILLS " I'M SHAKING " ONSET THIS EVENING WITH DYSURIA , CURRENTLY TAKING AMOXICILLIN FOR CYSTITIS PRESCRIBED BY DR. PHARR WITH NO IMPROVEMENT.

## 2012-09-19 ENCOUNTER — Inpatient Hospital Stay (HOSPITAL_COMMUNITY): Payer: Medicare Other

## 2012-09-19 ENCOUNTER — Encounter (HOSPITAL_COMMUNITY): Payer: Self-pay | Admitting: *Deleted

## 2012-09-19 ENCOUNTER — Inpatient Hospital Stay (HOSPITAL_COMMUNITY)
Admission: EM | Admit: 2012-09-19 | Discharge: 2012-09-21 | DRG: 690 | Disposition: A | Discharge status: Home / Self Care | Payer: Medicare Other | Attending: Internal Medicine | Admitting: Internal Medicine

## 2012-09-19 DIAGNOSIS — G4733 Obstructive sleep apnea (adult) (pediatric): Secondary | ICD-10-CM

## 2012-09-19 DIAGNOSIS — R509 Fever, unspecified: Secondary | ICD-10-CM

## 2012-09-19 DIAGNOSIS — D332 Benign neoplasm of brain, unspecified: Secondary | ICD-10-CM

## 2012-09-19 DIAGNOSIS — N390 Urinary tract infection, site not specified: Secondary | ICD-10-CM | POA: Diagnosis present

## 2012-09-19 DIAGNOSIS — IMO0002 Reserved for concepts with insufficient information to code with codable children: Secondary | ICD-10-CM | POA: Diagnosis present

## 2012-09-19 DIAGNOSIS — R002 Palpitations: Secondary | ICD-10-CM

## 2012-09-19 DIAGNOSIS — R0609 Other forms of dyspnea: Secondary | ICD-10-CM

## 2012-09-19 DIAGNOSIS — F411 Generalized anxiety disorder: Secondary | ICD-10-CM

## 2012-09-19 DIAGNOSIS — E039 Hypothyroidism, unspecified: Secondary | ICD-10-CM | POA: Diagnosis present

## 2012-09-19 DIAGNOSIS — N12 Tubulo-interstitial nephritis, not specified as acute or chronic: Secondary | ICD-10-CM | POA: Diagnosis present

## 2012-09-19 HISTORY — DX: Unspecified osteoarthritis, unspecified site: M19.90

## 2012-09-19 LAB — URINALYSIS, ROUTINE W REFLEX MICROSCOPIC
Glucose, UA: NEGATIVE mg/dL
Protein, ur: NEGATIVE mg/dL
Specific Gravity, Urine: 1.012 (ref 1.005–1.030)
pH: 5.5 (ref 5.0–8.0)

## 2012-09-19 LAB — CBC
HCT: 33.2 % — ABNORMAL LOW (ref 36.0–46.0)
Hemoglobin: 11.1 g/dL — ABNORMAL LOW (ref 12.0–15.0)
MCHC: 33.4 g/dL (ref 30.0–36.0)
MCV: 91.2 fL (ref 78.0–100.0)
WBC: 9.3 10*3/uL (ref 4.0–10.5)

## 2012-09-19 LAB — BASIC METABOLIC PANEL
BUN: 24 mg/dL — ABNORMAL HIGH (ref 6–23)
Chloride: 98 mEq/L (ref 96–112)
Creatinine, Ser: 0.95 mg/dL (ref 0.50–1.10)
Glucose, Bld: 125 mg/dL — ABNORMAL HIGH (ref 70–99)
Potassium: 3.4 mEq/L — ABNORMAL LOW (ref 3.5–5.1)

## 2012-09-19 LAB — URINE MICROSCOPIC-ADD ON

## 2012-09-19 LAB — CG4 I-STAT (LACTIC ACID): Lactic Acid, Venous: 0.78 mmol/L (ref 0.5–2.2)

## 2012-09-19 MED ORDER — DEXTROSE 5 % IV SOLN: 1.0000 g | Freq: Once | INTRAVENOUS | Status: DC

## 2012-09-19 MED ORDER — LISINOPRIL-HYDROCHLOROTHIAZIDE 20-12.5 MG PO TABS: 1.0000 | ORAL_TABLET | Freq: Two times a day (BID) | ORAL | Status: DC

## 2012-09-19 MED ORDER — SODIUM CHLORIDE 0.9 % IV SOLN
INTRAVENOUS | Status: DC
  Administered 2012-09-19 – 2012-09-20 (×3): via INTRAVENOUS

## 2012-09-19 MED ORDER — LEVOTHYROXINE SODIUM 88 MCG PO TABS
88.0000 ug | ORAL_TABLET | Freq: Every day | ORAL | Status: DC
  Administered 2012-09-19 – 2012-09-21 (×3): 88 ug via ORAL
  Filled 2012-09-19 (×4): qty 1

## 2012-09-19 MED ORDER — HEPARIN SODIUM (PORCINE) 5000 UNIT/ML IJ SOLN
5000.0000 [IU] | Freq: Three times a day (TID) | INTRAMUSCULAR | Status: DC
  Filled 2012-09-19 (×5): qty 1

## 2012-09-19 MED ORDER — LISINOPRIL 20 MG PO TABS
20.0000 mg | ORAL_TABLET | Freq: Every day | ORAL | Status: DC
  Administered 2012-09-19 – 2012-09-21 (×3): 20 mg via ORAL
  Filled 2012-09-19 (×3): qty 1

## 2012-09-19 MED ORDER — SODIUM CHLORIDE 0.9 % IV SOLN
Freq: Once | INTRAVENOUS | Status: AC
  Administered 2012-09-19: 01:00:00 via INTRAVENOUS

## 2012-09-19 MED ORDER — LORAZEPAM 0.5 MG PO TABS: 0.5000 mg | ORAL_TABLET | Freq: Once | ORAL | Status: AC | PRN

## 2012-09-19 MED ORDER — METOPROLOL TARTRATE 50 MG PO TABS
75.0000 mg | ORAL_TABLET | Freq: Two times a day (BID) | ORAL | Status: DC
  Administered 2012-09-19 – 2012-09-21 (×5): 75 mg via ORAL
  Filled 2012-09-19 (×7): qty 1

## 2012-09-19 MED ORDER — ACETAMINOPHEN 325 MG PO TABS
650.0000 mg | ORAL_TABLET | Freq: Four times a day (QID) | ORAL | Status: DC | PRN
  Administered 2012-09-21: 650 mg via ORAL
  Filled 2012-09-19: qty 2

## 2012-09-19 MED ORDER — CIPROFLOXACIN IN D5W 400 MG/200ML IV SOLN
400.0000 mg | Freq: Two times a day (BID) | INTRAVENOUS | Status: DC
  Administered 2012-09-19 – 2012-09-21 (×6): 400 mg via INTRAVENOUS
  Filled 2012-09-19 (×7): qty 200

## 2012-09-19 NOTE — ED Provider Notes (Signed)
History    CSN: [id number] Arrival date & time 09/18/12  2246  First MD Initiated Contact with Patient 09/19/12 0020     Chief Complaint  Patient presents with  . Fever   (Consider location/radiation/quality/duration/timing/severity/associated sxs/prior Treatment) HPI 79 yo female presents to the ER with complaint of shaking chills.  Pt has been on amoxicillin after having sxs of UTI over the weekend.  Pt had left over amoxicillin from dental procedure, was told by her pcm to continue the medication.  She started having shaking chills this afternoon, did not know she had a fever until she came to the ER.  She reports nausea and back pain as well.  No cough, no rash, no tick bites, no headache, no neck stiffness.  No abd pain, no change in bowel habits.  Past Medical History  Diagnosis Date  . Brain tumor   . Hypothyroidism   . Sleep apnea 04/22/08    SLEEP STUDY done @ [facility]. Read by Dr. Fannie Knee. Repeat study done 05/22/11 @ [facility].  . Palpitations 10/24/11    cardionet monitor worn  . Meningioma   . HTN (hypertension) 10/24/11    ECHO; EF=> 55% tHERE IS MILD CONCENTRIC LV HYPERTROPHY. LV SYSTOLIC FUNCTION IS NORMAL. THE TRANSMITRAL SPECTRAL DOPPLER FLOW PATTERN SUGGESTIVE OF PSEUDONORMALIATION.  Marland Kitchen Sleep apnea   . Arthritis    Past Surgical History  Procedure Laterality Date  . Appendectomy    . Tonsillectomy    . Tubal ligation     Family History  Problem Relation Age of Onset  . Heart disease Brother     ischemic  . Hypertension    . Stroke     History  Substance Use Topics  . Smoking status: Never Smoker   . Smokeless tobacco: Not on file  . Alcohol Use: No   OB History   Grav Para Term Preterm Abortions TAB SAB Ect Mult Living                 Review of Systems  See History of Present Illness; otherwise all other systems are reviewed and negative  Allergies  Review of patient's allergies indicates no known allergies.  Home  Medications   Current Outpatient Rx  Name  Route  Sig  Dispense  Refill  . Ascorbic Acid (VITAMIN C PO)   Oral   Take by mouth.         . beta carotene w/minerals (OCUVITE) tablet   Oral   Take 1 tablet by mouth daily.         Marland Kitchen co-enzyme Q-10 30 MG capsule   Oral   Take 30 mg by mouth 3 (three) times daily.         . Ginger, Zingiber officinalis, (GINGER PO)   Oral   Take by mouth.         . hydrALAZINE (APRESOLINE) 25 MG tablet      25 mg. Take 1/2-1 po prn         . levothyroxine (SYNTHROID, LEVOTHROID) 88 MCG tablet   Oral   Take 88 mcg by mouth daily before breakfast.         . lisinopril-hydrochlorothiazide (PRINZIDE,ZESTORETIC) 20-12.5 MG per tablet   Oral   Take 1 tablet by mouth 2 (two) times daily.          Marland Kitchen MANGANESE PO   Oral   Take by mouth.         . metoprolol (  LOPRESSOR) 50 MG tablet   Oral   Take 1.5 tablets (75 mg total) by mouth 2 (two) times daily.   270 tablet   3   . omega-3 acid ethyl esters (LOVAZA) 1 G capsule   Oral   Take 2 g by mouth 2 (two) times daily.         Marland Kitchen OVER THE COUNTER MEDICATION   Oral   Take 2 tablets by mouth every morning. Alleviate-natural otc medication for joints         . OVER THE COUNTER MEDICATION   Oral   Take 1 tablet by mouth every morning. Herbal supplements         . OVER THE COUNTER MEDICATION   Oral   Take 1 tablet by mouth every morning. Willow Bark          BP 110/38  Pulse 73  Temp(Src) 98.6 F (37 C) (Oral)  Resp 16  SpO2 94% Physical Exam  Nursing note and vitals reviewed. Constitutional: She is oriented to person, place, and time. She appears well-developed and well-nourished.  HENT:  Head: Normocephalic and atraumatic.  Nose: Nose normal.  Mouth/Throat: Oropharynx is clear and moist.  Eyes: Conjunctivae and EOM are normal. Pupils are equal, round, and reactive to light.  Neck: Normal range of motion. Neck supple. No JVD present. No tracheal deviation  present. No thyromegaly present.  Cardiovascular: Normal rate, regular rhythm, normal heart sounds and intact distal pulses.  Exam reveals no gallop and no friction rub.   No murmur heard. Pulmonary/Chest: Effort normal and breath sounds normal. No stridor. No respiratory distress. She has no wheezes. She has no rales. She exhibits no tenderness.  Abdominal: Soft. Bowel sounds are normal. She exhibits no distension and no mass. There is no tenderness. There is no rebound and no guarding.  CVA tenderness bilaterally  Musculoskeletal: Normal range of motion. She exhibits no edema and no tenderness.  Lymphadenopathy:    She has no cervical adenopathy.  Neurological: She is alert and oriented to person, place, and time. No cranial nerve deficit. She exhibits normal muscle tone. Coordination normal.  Skin: Skin is warm and dry. No rash noted. No erythema. No pallor.  Psychiatric: She has a normal mood and affect. Her behavior is normal. Judgment and thought content normal.    ED Course  Procedures (including critical care time) Labs Reviewed  URINALYSIS, ROUTINE W REFLEX MICROSCOPIC - Abnormal; Notable for the following:    APPearance CLOUDY (*)    Hgb urine dipstick SMALL (*)    Nitrite POSITIVE (*)    Leukocytes, UA LARGE (*)    All other components within normal limits  CBC WITH DIFFERENTIAL - Abnormal; Notable for the following:    Neutrophils Relative % 84 (*)    Lymphocytes Relative 7 (*)    Lymphs Abs 0.6 (*)    All other components within normal limits  COMPREHENSIVE METABOLIC PANEL - Abnormal; Notable for the following:    Sodium 133 (*)    Glucose, Bld 110 (*)    BUN 24 (*)    GFR calc non Af Amer 60 (*)    GFR calc Af Amer 69 (*)    All other components within normal limits  URINE MICROSCOPIC-ADD ON - Abnormal; Notable for the following:    Bacteria, UA MANY (*)    All other components within normal limits  URINE CULTURE  CG4 I-STAT (LACTIC ACID)   No results found. 1.  Urinary tract infection  2. Fever     MDM  79 year old female with urinary tract infection, concern for early pyelonephritis.  Patient has been on amoxicillin, without improvement in symptoms.  Will start on ciprofloxacin.  Patient, reports she's taken this past without difficulties.  Will discuss with hospitalist for admission given her systemic symptoms.  Olivia Mackie, MD 09/19/12 [phone]

## 2012-09-19 NOTE — Evaluation (Signed)
Physical Therapy Evaluation Patient Details Name: Brianna Reyes MRN: [id number] DOB: 07/19/1930 Today's Date: 09/19/2012 Time: 7829-5621 PT Time Calculation (min): 10 min  PT Assessment / Plan / Recommendation History of Present Illness  79 yo adm with fever and UTI. Pt with h/o brain tumor, HTN, and sleep apnea with CPAP.  Clinical Impression  Patient evaluated by Physical Therapy with no further acute PT needs identified. The patient has no further questions. Son present throughout evaluation and states he has no concerns regarding her mobility and ability to take care of herself on d/c. See below for any follow-up Physial Therapy or equipment needs. PT is signing off. Thank you for this referral.     PT Assessment  Patent does not need any further PT services    Follow Up Recommendations  No PT follow up    Does the patient have the potential to tolerate intense rehabilitation      Barriers to Discharge        Equipment Recommendations  None recommended by PT    Recommendations for Other Services     Frequency      Precautions / Restrictions     Pertinent Vitals/Pain Denied pain      Mobility  Bed Mobility Bed Mobility: Supine to Sit;Sitting - Scoot to Edge of Bed Supine to Sit: 6: Modified independent (Device/Increase time);HOB flat Sitting - Scoot to Edge of Bed: 7: Independent Transfers Transfers: Sit to Stand;Stand to Sit Sit to Stand: 7: Independent Stand to Sit: 7: Independent Ambulation/Gait Ambulation/Gait Assistance: 5: Supervision Ambulation Distance (Feet): 12 Feet Assistive device: None Ambulation/Gait Assistance Details: supervision due to IV pole Gait Pattern: Step-through pattern    Exercises     PT Diagnosis:    PT Problem List:   PT Treatment Interventions:       PT Goals(Current goals can be found in the care plan section) Acute Rehab PT Goals Patient Stated Goal: Right now I'm just concerned with eating my lunch. I haven't had  anything all day.  Visit Information  Last PT Received On: 09/19/12 Assistance Needed: +1 History of Present Illness: 79 yo adm with fever and UTI. Pt with h/o brain tumor, HTN, and sleep apnea with CPAP.       Prior Functioning  Home Living Family/patient expects to be discharged to:: Private residence Living Arrangements: Alone Available Help at Discharge: Family (son checks on her each day) Home Equipment: Cane - single point Additional Comments: son present and both deny any previous problems with balance or mobility Prior Function Level of Independence: Independent Comments: does her own housework Communication Communication: No difficulties    Cognition  Cognition Arousal/Alertness: Awake/alert Behavior During Therapy: WFL for tasks assessed/performed Overall Cognitive Status: Within Functional Limits for tasks assessed    Extremity/Trunk Assessment Upper Extremity Assessment Upper Extremity Assessment: Overall WFL for tasks assessed Lower Extremity Assessment Lower Extremity Assessment: Overall WFL for tasks assessed Cervical / Trunk Assessment Cervical / Trunk Assessment: Kyphotic   Balance    End of Session PT - End of Session Activity Tolerance: Patient tolerated treatment well Patient left: in chair;with call bell/phone within reach;with family/visitor present  GP     Hodari Chuba 09/19/2012, 2:33 PM

## 2012-09-19 NOTE — H&P (Signed)
[facility] History and Physical  Brianna Reyes ZOX:[id number] DOB: May 04, 1930 DOA: 09/19/2012  Referring physician: ED PCP: Londell Moh, MD   Chief Complaint: UTI  HPI: Brianna Reyes is a 79 y.o. female who presents with ongoing complaints of chills, and dysuria going on over the past week.  The patient had left over amoxicillin from a dental procedure and was told by her PCP to continue this medication for her presumed UTI.  Unfortunately her symptoms not only persisted but worsened and she developed rigors this afternoon.  In the ED she was febrile to 103.1, and her UA was markedly positive for a UTI, she was started on Cipro IV since the beta lactam she had just been on wasn't helping, and hospitalist asked to admit.  Review of Systems: No cough, rash, headache, no meningismus, no abd pain, has started to have back pain though, blood pressures continue to be labile at home but she states that this is, somewhat frustratingly, the norm for her (will be 120s at one time then 190s a few hours later).  Past Medical History  Diagnosis Date  . Brain tumor   . Hypothyroidism   . Sleep apnea 04/22/08    SLEEP STUDY done @ [facility]. Read by Dr. Fannie Knee. Repeat study done 05/22/11 @ [facility].  . Palpitations 10/24/11    cardionet monitor worn  . Meningioma   . HTN (hypertension) 10/24/11    ECHO; EF=> 55% tHERE IS MILD CONCENTRIC LV HYPERTROPHY. LV SYSTOLIC FUNCTION IS NORMAL. THE TRANSMITRAL SPECTRAL DOPPLER FLOW PATTERN SUGGESTIVE OF PSEUDONORMALIATION.  Marland Kitchen Sleep apnea   . Arthritis    Past Surgical History  Procedure Laterality Date  . Appendectomy    . Tonsillectomy    . Tubal ligation     Social History:  reports that she has never smoked. She does not have any smokeless tobacco history on file. She reports that she does not drink alcohol or use illicit drugs.   No Known Allergies  Family History  Problem Relation Age of Onset  . Heart  disease Brother     ischemic  . Hypertension    . Stroke      Prior to Admission medications   Medication Sig Start Date End Date Taking? Authorizing Provider  Ascorbic Acid (VITAMIN C PO) Take by mouth.   Yes Historical Provider, MD  beta carotene w/minerals (OCUVITE) tablet Take 1 tablet by mouth daily.   Yes Historical Provider, MD  co-enzyme Q-10 30 MG capsule Take 30 mg by mouth 3 (three) times daily.   Yes Historical Provider, MD  Ginger, Zingiber officinalis, (GINGER PO) Take by mouth.   Yes Historical Provider, MD  hydrALAZINE (APRESOLINE) 25 MG tablet 25 mg. Take 1/2-1 po prn 06/11/12  Yes Historical Provider, MD  levothyroxine (SYNTHROID, LEVOTHROID) 88 MCG tablet Take 88 mcg by mouth daily before breakfast.   Yes Historical Provider, MD  lisinopril-hydrochlorothiazide (PRINZIDE,ZESTORETIC) 20-12.5 MG per tablet Take 1 tablet by mouth 2 (two) times daily.    Yes Historical Provider, MD  MANGANESE PO Take by mouth.   Yes Historical Provider, MD  metoprolol (LOPRESSOR) 50 MG tablet Take 1.5 tablets (75 mg total) by mouth 2 (two) times daily. 09/16/12  Yes Lennette Bihari, MD  omega-3 acid ethyl esters (LOVAZA) 1 G capsule Take 2 g by mouth 2 (two) times daily.   Yes Historical Provider, MD  OVER THE COUNTER MEDICATION Take 2 tablets by mouth every morning. Alleviate-natural otc  medication for joints   Yes Historical Provider, MD  OVER THE COUNTER MEDICATION Take 1 tablet by mouth every morning. Herbal supplements   Yes Historical Provider, MD  OVER THE COUNTER MEDICATION Take 1 tablet by mouth every morning. Willow Bark   Yes Historical Provider, MD   Physical Exam: Filed Vitals:   09/18/12 2304 09/19/12 0050 09/19/12 0213  BP: 124/42 110/38 106/42  Pulse: 81 73 70  Temp: 103.1 F (39.5 C) 98.6 F (37 C)   TempSrc: Oral Oral   Resp: 14 16 16   SpO2: 94% 94% 95%    General:  NAD, resting comfortably in bed Eyes: PEERLA EOMI ENT: mucous membranes moist Neck: supple w/o  JVD Cardiovascular: RRR w/o MRG Respiratory: CTA B Abdomen: soft, nt, nd, bs+, positive L flank CVA tenderness to percussion Skin: no rash nor lesion Musculoskeletal: MAE, full ROM all 4 extremities Psychiatric: normal tone and affect Neurologic: AAOx3, grossly non-focal  Labs on Admission:  Basic Metabolic Panel:  Recent Labs Lab 09/18/12 2307  NA 133*  K 4.0  CL 96  CO2 27  GLUCOSE 110*  BUN 24*  CREATININE 0.89  CALCIUM 9.3   Liver Function Tests:  Recent Labs Lab 09/18/12 2307  AST 24  ALT 21  ALKPHOS 76  BILITOT 0.6  PROT 7.3  ALBUMIN 3.6   No results found for this basename: LIPASE, AMYLASE,  in the last 168 hours No results found for this basename: AMMONIA,  in the last 168 hours CBC:  Recent Labs Lab 09/18/12 2307  WBC 7.7  NEUTROABS 6.5  HGB 12.7  HCT 37.4  MCV 91.2  PLT 186   Cardiac Enzymes: No results found for this basename: CKTOTAL, CKMB, CKMBINDEX, TROPONINI,  in the last 168 hours  BNP (last 3 results) No results found for this basename: PROBNP,  in the last 8760 hours CBG: No results found for this basename: GLUCAP,  in the last 168 hours  Radiological Exams on Admission: No results found.  EKG: Independently reviewed.  Assessment/Plan Principal Problem:   Pyelonephritis Active Problems:   HYPOTHYROIDISM   HYPERTENSION NEC   UTI (lower urinary tract infection)   1. Pyelonephritis - clinically patient would appear to have a L sided pyelonephritis from her ascending UTI, with CVA tenderness evident on physical exam.  She also has systemic symptoms of fever and rigors.  Tylenol ordered PRN for fevers, continuing cipro started in ED given that she failed a beta lactam (albiet a very narrow spectrum one), urine culture is pending. 2. HTN - continue home meds but holding HCTZ as I want to hydrate her gently due to the risk of pending sepsis, would hold these but patient states that it isnt uncommon for her to have low to normal blood  pressures one min then the next be in the 190s to 200s SBP and she was just 190s earlier in the ED she states.  Would hold the remainder of her HTN meds if she were to develop full blown sepsis. 3. Hypothyroidism - continue synthroid    Code Status: Full Code (must indicate code status--if unknown or must be presumed, indicate so) Family Communication: Spoke with son at bedside (indicate person spoken with, if applicable, with phone number if by telephone) Disposition Plan: Admit to inpatient (indicate anticipated LOS)  Time spent: 70 min  GARDNER, JARED M. [facility] Pager [phone]  If 7PM-7AM, please contact night-coverage [email or web address] Password [email or web address] 09/19/2012, 3:16 AM

## 2012-09-19 NOTE — Progress Notes (Signed)
PAtient admitted early this AM.  Please see H&P.  Await U/S and urine culture- patient ready to eat  Jessica vann DO

## 2012-09-20 DIAGNOSIS — G4733 Obstructive sleep apnea (adult) (pediatric): Secondary | ICD-10-CM

## 2012-09-20 NOTE — Progress Notes (Signed)
Patient places herself on CPAP at night. Everything is hooked up and tolerating well.

## 2012-09-20 NOTE — Progress Notes (Signed)
[facility] PROGRESS NOTE  Brianna Reyes ZOX:[id number] DOB: April 06, 1930 DOA: 09/19/2012 PCP: Londell Moh, MD  Assessment/Plan: 1. ecoli UTI- await sensitivities, cipro IV 2. Hypokalemia- replete 3. Hyponatremia- stable  Code Status: full Family Communication: patient Disposition Plan: home in AM   Consultants:  none  Procedures:  U/s renal  Antibiotics:  cipro  HPI/Subjective: Feeling better- up walking in room  Objective: Filed Vitals:   09/19/12 2100 09/19/12 2200 09/20/12 0443 09/20/12 1100  BP: 152/59  138/57 144/70  Pulse: 68 72 54 63  Temp: 98.4 F (36.9 C)  98 F (36.7 C)   TempSrc:      Resp: 16 16 16    Height:      Weight:      SpO2: 94% 98% 97%     Intake/Output Summary (Last 24 hours) at 09/20/12 1247 Last data filed at 09/20/12 0659  Gross per 24 hour  Intake   1700 ml  Output      2 ml  Net   1698 ml   Filed Weights   09/19/12 0339  Weight: 92.488 kg (203 lb 14.4 oz)    Exam:   General:  A+Ox3, NAD  Cardiovascular: rrr  Respiratory:  clear  Abdomen: +BS soft  Musculoskeletal: moves all 4 ext   Data Reviewed: Basic Metabolic Panel:  Recent Labs Lab 09/18/12 2307 09/19/12 0445  NA 133* 134*  K 4.0 3.4*  CL 96 98  CO2 27 26  GLUCOSE 110* 125*  BUN 24* 24*  CREATININE 0.89 0.95  CALCIUM 9.3 8.9   Liver Function Tests:  Recent Labs Lab 09/18/12 2307  AST 24  ALT 21  ALKPHOS 76  BILITOT 0.6  PROT 7.3  ALBUMIN 3.6   No results found for this basename: LIPASE, AMYLASE,  in the last 168 hours No results found for this basename: AMMONIA,  in the last 168 hours CBC:  Recent Labs Lab 09/18/12 2307 09/19/12 0445  WBC 7.7 9.3  NEUTROABS 6.5  --   HGB 12.7 11.1*  HCT 37.4 33.2*  MCV 91.2 91.2  PLT 186 199   Cardiac Enzymes: No results found for this basename: CKTOTAL, CKMB, CKMBINDEX, TROPONINI,  in the last 168 hours BNP (last 3 results) No results found for this basename: PROBNP,  in the  last 8760 hours CBG: No results found for this basename: GLUCAP,  in the last 168 hours  Recent Results (from the past 240 hour(s))  URINE CULTURE     Status: None   Collection Time    09/18/12 11:36 PM      Result Value Range Status   Specimen Description URINE, CLEAN CATCH   Final   Special Requests NONE   Final   Culture  Setup Time 09/19/2012 12:09   Final   Colony Count >=100,000 COLONIES/ML   Final   Culture ESCHERICHIA COLI   Final   Report Status PENDING   Incomplete     Studies: US Renal  09/19/2012   *RADIOLOGY REPORT*  Clinical Data: History of clinical evidence of left pyelonephritis. Evaluation for possible abscess.  History of hypertension.  RENAL/URINARY TRACT ULTRASOUND COMPLETE  Comparison:  None.  Findings:  Right Kidney:  Right renal length is 11.1 cm.  There is a cyst in the lower pole of the right kidney.  The cyst measures 4.2 x 3.6 x 3.2 cm. It appears simple.  There is a cyst in the lower midportion of the right kidney.  The cyst measures 1.1 x 1.0  x 1.0 cm. Some low intensity internal echoes may reflect noise or debris.  No solid nodularity or papillary area is seen.  Left Kidney:  Left renal length is 10.5 cm.  There is a cyst in the midportion of the left kidney.  The cyst measures 1.1 x 1.0 x 1.1 cm. It is small. Some low intensity internal echoes may reflect noise of debris.  No solid nodularity or papillary area is seen.  Examination of each kidney shows no evidence of hydronephrosis, solid mass, calculus, parenchymal loss, or parenchymal textural abnormality.  Bladder:  No urinary bladder abnormalities are identified.  IMPRESSION: Normal sized kidneys.  No hydronephrosis.  Renal cysts.  No lesion was seen to suggest renal abscess.  No perinephric fluid is evident.   Original Report Authenticated By: Onalee Hua Call    Scheduled Meds: . ciprofloxacin  400 mg Intravenous Q12H  . levothyroxine  88 mcg Oral QAC breakfast  . lisinopril  20 mg Oral Daily  . metoprolol   75 mg Oral BID   Continuous Infusions: . sodium chloride 100 mL/hr at 09/20/12 1914    Principal Problem:   Pyelonephritis Active Problems:   HYPOTHYROIDISM   HYPERTENSION NEC   UTI (lower urinary tract infection)    Time spent: 35    [facility], Brianna Reyes  [facility] Pager [phone]. If 7PM-7AM, please contact night-coverage at [email or web address], password [email or web address] 09/20/2012, 12:47 PM  LOS: 1 day

## 2012-09-21 DIAGNOSIS — IMO0002 Reserved for concepts with insufficient information to code with codable children: Secondary | ICD-10-CM

## 2012-09-21 LAB — CBC
HCT: 33.7 % — ABNORMAL LOW (ref 36.0–46.0)
MCHC: 32.9 g/dL (ref 30.0–36.0)
MCV: 93.1 fL (ref 78.0–100.0)
RDW: 14.7 % (ref 11.5–15.5)

## 2012-09-21 LAB — URINE CULTURE

## 2012-09-21 LAB — BASIC METABOLIC PANEL
BUN: 17 mg/dL (ref 6–23)
Chloride: 106 mEq/L (ref 96–112)
Creatinine, Ser: 0.82 mg/dL (ref 0.50–1.10)
GFR calc Af Amer: 76 mL/min — ABNORMAL LOW (ref >90)
GFR calc non Af Amer: 66 mL/min — ABNORMAL LOW (ref >90)

## 2012-09-21 MED ORDER — CIPROFLOXACIN HCL 500 MG PO TABS: 500.0000 mg | ORAL_TABLET | Freq: Two times a day (BID) | ORAL | Status: DC

## 2012-09-21 MED ORDER — LISINOPRIL 20 MG PO TABS: 20.0000 mg | ORAL_TABLET | Freq: Every day | ORAL | Status: DC

## 2012-09-21 NOTE — Discharge Summary (Signed)
Physician Discharge Summary  Brianna Reyes FAO:[id number] DOB: 09/03/1930 DOA: 09/19/2012  PCP: Londell Moh, MD  Admit date: 09/19/2012 Discharge date: 09/21/2012  Time spent: 35 minutes  Recommendations for Outpatient Follow-up:   Discharge Diagnoses:  Principal Problem:   Pyelonephritis Active Problems:   HYPOTHYROIDISM   HYPERTENSION NEC   UTI (lower urinary tract infection)   Discharge Condition: improved  Diet recommendation: cardiac  Filed Weights   09/19/12 0339  Weight: 92.488 kg (203 lb 14.4 oz)    History of present illness:  Brianna Reyes is a 79 y.o. female who presents with ongoing complaints of chills, and dysuria going on over the past week. The patient had left over amoxicillin from a dental procedure and was told by her PCP to continue this medication for her presumed UTI. Unfortunately her symptoms not only persisted but worsened and she developed rigors this afternoon.  In the ED she was febrile to 103.1, and her UA was markedly positive for a UTI, she was started on Cipro IV since the beta lactam she had just been on wasn't helping, and hospitalist asked to admit.   Hospital Course:  ecoli UTI- sensitive to cipro Hypokalemia- replete  Hyponatremia- stable Meningioma- no symptoms; last MRI was 2010- defer to PCP  Procedures: Renal u/s  Consultations:  none  Discharge Exam: Filed Vitals:   09/20/12 1100 09/20/12 1300 09/20/12 1900 09/21/12 0611  BP: 144/70 146/50 148/65 144/54  Pulse: 63 56 58 61  Temp:  98.1 F (36.7 C) 97.8 F (36.6 C) 98.1 F (36.7 C)  TempSrc:   Oral Oral  Resp:  18 16 16   Height:      Weight:      SpO2:   98% 97%    General: A+Ox3, NAD- walking in room Cardiovascular: rrr Respiratory: clear  Discharge Instructions      Discharge Orders   Future Orders Complete By Expires     Diet - low sodium heart healthy  As directed     Discharge instructions  As directed     Scheduling Instructions:       Continue Cpap at home    Comments:      PCP 1 week    Increase activity slowly  As directed         Medication List    STOP taking these medications       lisinopril-hydrochlorothiazide 20-12.5 MG per tablet  Commonly known as:  PRINZIDE,ZESTORETIC      TAKE these medications       beta carotene w/minerals tablet  Take 1 tablet by mouth daily.     ciprofloxacin 500 MG tablet  Commonly known as:  CIPRO  Take 1 tablet (500 mg total) by mouth 2 (two) times daily.     co-enzyme Q-10 30 MG capsule  Take 30 mg by mouth 3 (three) times daily.     GINGER PO  Take by mouth.     hydrALAZINE 25 MG tablet  Commonly known as:  APRESOLINE  25 mg. Take 1/2-1 po prn     levothyroxine 88 MCG tablet  Commonly known as:  SYNTHROID, LEVOTHROID  Take 88 mcg by mouth daily before breakfast.     lisinopril 20 MG tablet  Commonly known as:  PRINIVIL,ZESTRIL  Take 1 tablet (20 mg total) by mouth daily.     MANGANESE PO  Take by mouth.     metoprolol 50 MG tablet  Commonly known as:  LOPRESSOR  Take 1.5 tablets (75  mg total) by mouth 2 (two) times daily.     omega-3 acid ethyl esters 1 G capsule  Commonly known as:  LOVAZA  Take 2 g by mouth 2 (two) times daily.     OVER THE COUNTER MEDICATION  Take 2 tablets by mouth every morning. Alleviate-natural otc medication for joints     OVER THE COUNTER MEDICATION  Take 1 tablet by mouth every morning. Herbal supplements     OVER THE COUNTER MEDICATION  Take 1 tablet by mouth every morning. Willow Bark     VITAMIN C PO  Take by mouth.       No Known Allergies Follow-up Information   Follow up with Londell Moh, MD In 1 week.   Contact information:   [address] [place] [address] [phone]        The results of significant diagnostics from this hospitalization (including imaging, microbiology, ancillary and laboratory) are listed below for reference.    Significant Diagnostic  Studies: US Renal  09/19/2012   *RADIOLOGY REPORT*  Clinical Data: History of clinical evidence of left pyelonephritis. Evaluation for possible abscess.  History of hypertension.  RENAL/URINARY TRACT ULTRASOUND COMPLETE  Comparison:  None.  Findings:  Right Kidney:  Right renal length is 11.1 cm.  There is a cyst in the lower pole of the right kidney.  The cyst measures 4.2 x 3.6 x 3.2 cm. It appears simple.  There is a cyst in the lower midportion of the right kidney.  The cyst measures 1.1 x 1.0 x 1.0 cm. Some low intensity internal echoes may reflect noise or debris.  No solid nodularity or papillary area is seen.  Left Kidney:  Left renal length is 10.5 cm.  There is a cyst in the midportion of the left kidney.  The cyst measures 1.1 x 1.0 x 1.1 cm. It is small. Some low intensity internal echoes may reflect noise of debris.  No solid nodularity or papillary area is seen.  Examination of each kidney shows no evidence of hydronephrosis, solid mass, calculus, parenchymal loss, or parenchymal textural abnormality.  Bladder:  No urinary bladder abnormalities are identified.  IMPRESSION: Normal sized kidneys.  No hydronephrosis.  Renal cysts.  No lesion was seen to suggest renal abscess.  No perinephric fluid is evident.   Original Report Authenticated By: Onalee Hua Call    Microbiology: Recent Results (from the past 240 hour(s))  URINE CULTURE     Status: None   Collection Time    09/18/12 11:36 PM      Result Value Range Status   Specimen Description URINE, CLEAN CATCH   Final   Special Requests NONE   Final   Culture  Setup Time 09/19/2012 12:09   Final   Colony Count >=100,000 COLONIES/ML   Final   Culture ESCHERICHIA COLI   Final   Report Status 09/21/2012 FINAL   Final   Organism ID, Bacteria ESCHERICHIA COLI   Final     Labs: Basic Metabolic Panel:  Recent Labs Lab 09/18/12 2307 09/19/12 0445 09/21/12 0530  NA 133* 134* 137  K 4.0 3.4* 3.7  CL 96 98 106  CO2 27 26 25   GLUCOSE 110*  125* 100*  BUN 24* 24* 17  CREATININE 0.89 0.95 0.82  CALCIUM 9.3 8.9 8.7   Liver Function Tests:  Recent Labs Lab 09/18/12 2307  AST 24  ALT 21  ALKPHOS 76  BILITOT 0.6  PROT 7.3  ALBUMIN 3.6   No results  found for this basename: LIPASE, AMYLASE,  in the last 168 hours No results found for this basename: AMMONIA,  in the last 168 hours CBC:  Recent Labs Lab 09/18/12 2307 09/19/12 0445 09/21/12 0530  WBC 7.7 9.3 6.0  NEUTROABS 6.5  --   --   HGB 12.7 11.1* 11.1*  HCT 37.4 33.2* 33.7*  MCV 91.2 91.2 93.1  PLT 186 199 177   Cardiac Enzymes: No results found for this basename: CKTOTAL, CKMB, CKMBINDEX, TROPONINI,  in the last 168 hours BNP: BNP (last 3 results) No results found for this basename: PROBNP,  in the last 8760 hours CBG: No results found for this basename: GLUCAP,  in the last 168 hours     Signed:  Marlin Canary  [facility] 09/21/2012, 2:24 PM

## 2012-10-22 ENCOUNTER — Other Ambulatory Visit: Payer: Self-pay | Admitting: Cardiovascular Disease

## 2012-10-23 NOTE — Telephone Encounter (Signed)
Per last OV pt was on Lisinopril-HCTZ. Called pt to inquire about dosage, LMTCB.

## 2012-10-24 NOTE — Telephone Encounter (Signed)
Rx was sent to pharmacy electronically. Advised patient to schedule an appt with an extender to follow up on her medications.

## 2012-10-24 NOTE — Telephone Encounter (Signed)
Please call her-there is a mix up in her medicine-she needs it today-going out of town this afternoon.Please call asap!

## 2012-10-28 ENCOUNTER — Ambulatory Visit (INDEPENDENT_AMBULATORY_CARE_PROVIDER_SITE_OTHER): Payer: Medicare Other | Admitting: Cardiology

## 2012-10-28 ENCOUNTER — Encounter: Payer: Self-pay | Admitting: Cardiology

## 2012-10-28 VITALS — BP 138/82 | HR 68 | Ht 63.5 in | Wt 206.7 lb

## 2012-10-28 DIAGNOSIS — N12 Tubulo-interstitial nephritis, not specified as acute or chronic: Secondary | ICD-10-CM

## 2012-10-28 DIAGNOSIS — R002 Palpitations: Secondary | ICD-10-CM

## 2012-10-28 DIAGNOSIS — IMO0002 Reserved for concepts with insufficient information to code with codable children: Secondary | ICD-10-CM

## 2012-10-28 NOTE — Assessment & Plan Note (Signed)
controlled 

## 2012-10-28 NOTE — Progress Notes (Signed)
10/28/2012 Brianna Reyes   Sep 26, 1930  [id number]  Primary Physicia Londell Moh, MD Primary Cardiologist: Dr Tresa Endo  HPI:  The patient is an 79 year old female who has a history of hypertension, tachy palpitations, hypothyroidism, hyperlipidemia, complex sleep apnea, as well as hypothyroidism. She has been utilizing BiPAP therapy and notes significant improvement in her sleep and now will not sleep without it. She has documented mild LVH with grade 2 diastolic dysfunction. She has never had a cath or a Nuclear study. She is here for routine follow up. She was hospitalized for a few days in July with pyelonephritis.They made a minor adjustment in her B/P meds. She wanted to know if she could try decreasing her Metoprolol to 50 mg BID. She has had palpitations in the past but is worried about "taking too much medication". She also wanted to know if she could cut out her pm Lisinopril dose. I suggested she try one at a time. We will have her see Dr Tresa Endo in a month to see how she is doing with this.  Current Outpatient Prescriptions  Medication Sig Dispense Refill  . beta carotene w/minerals (OCUVITE) tablet Take 1 tablet by mouth daily.      Marland Kitchen co-enzyme Q-10 30 MG capsule Take 30 mg by mouth 3 (three) times daily.      . Ginger, Zingiber officinalis, (GINGER PO) Take by mouth.      . hydrALAZINE (APRESOLINE) 25 MG tablet 25 mg. Take 1/2-1 po prn      . levothyroxine (SYNTHROID, LEVOTHROID) 88 MCG tablet Take 88 mcg by mouth daily before breakfast.      . lisinopril (PRINIVIL,ZESTRIL) 20 MG tablet       . lisinopril-hydrochlorothiazide (PRINZIDE,ZESTORETIC) 20-12.5 MG per tablet Take 1 tablet by mouth every morning.      . metoprolol (LOPRESSOR) 50 MG tablet Take 1.5 tablets (75 mg total) by mouth 2 (two) times daily.  270 tablet  3  . Multiple Vitamin (MULTIVITAMIN WITH MINERALS) TABS tablet Take 1 tablet by mouth daily. Contains vitamin C      . omega-3 acid ethyl esters (LOVAZA) 1 G capsule  Take 2 g by mouth 2 (two) times daily.      Marland Kitchen OVER THE COUNTER MEDICATION Take 2 tablets by mouth 2 (two) times daily. Alleviate-natural otc medication for joints - ginger, baswillow (?), vit C, manganese, willow bark       No current facility-administered medications for this visit.    No Known Allergies  History   Social History  . Marital Status: Single    Spouse Name: N/A    Number of Children: N/A  . Years of Education: N/A   Occupational History  . Not on file.   Social History Main Topics  . Smoking status: Never Smoker   . Smokeless tobacco: Never Used  . Alcohol Use: No  . Drug Use: No  . Sexual Activity: No   Other Topics Concern  . Not on file   Social History Narrative  . No narrative on file     Review of Systems: General: negative for chills, fever, night sweats or weight changes.  Cardiovascular: negative for chest pain, dyspnea on exertion, edema, orthopnea, palpitations, paroxysmal nocturnal dyspnea or shortness of breath Dermatological: negative for rash Respiratory: negative for cough or wheezing Urologic: negative for hematuria Abdominal: negative for nausea, vomiting, diarrhea, bright red blood per rectum, melena, or hematemesis Neurologic: negative for visual changes, syncope, or dizziness All other systems reviewed and are otherwise  negative except as noted above.    Blood pressure 138/82, pulse 68, height 5' 3.5" (1.613 m), weight 206 lb 11.2 oz (93.759 kg).  General appearance: alert, cooperative and no distress Lungs: clear to auscultation bilaterally Heart: regular rate and rhythm  .  ASSESSMENT AND PLAN:   HYPERTENSION NEC controlled  PALPITATIONS controlled  Pyelonephritis Admitted to [facility] in July   PLAN  Try decreasing Metoprolol to 50 mg BID, go back to 75 mg BID if your palpitations recur. OK to decrease Lisinopril (stopping the pm dose). Follow up B/P one month.   Xiadani Damman KPA-C 10/28/2012 5:07 PM

## 2012-10-28 NOTE — Assessment & Plan Note (Signed)
Admitted to [facility] in July

## 2012-10-28 NOTE — Patient Instructions (Addendum)
Your physician has requested that you regularly monitor and record your blood pressure readings at home. Please use the same machine at the same time of day to check your readings and record them to bring to your follow-up visit.  Your physician recommends that you schedule a follow-up appointment in: one month with Dr Tresa Endo

## 2012-11-21 ENCOUNTER — Other Ambulatory Visit: Payer: Self-pay | Admitting: Cardiovascular Disease

## 2012-11-22 ENCOUNTER — Other Ambulatory Visit: Payer: Self-pay | Admitting: Cardiovascular Disease

## 2012-11-22 NOTE — Telephone Encounter (Signed)
Pt is out of medication and needs it to be called in for a refill Metoprolol

## 2012-11-25 NOTE — Telephone Encounter (Signed)
Rx was sent to pharmacy electronically. 

## 2012-12-02 ENCOUNTER — Ambulatory Visit: Payer: Medicare Other | Admitting: Cardiovascular Disease

## 2013-02-24 ENCOUNTER — Ambulatory Visit (INDEPENDENT_AMBULATORY_CARE_PROVIDER_SITE_OTHER): Payer: Medicare Other | Admitting: Cardiovascular Disease

## 2013-02-24 ENCOUNTER — Encounter: Payer: Self-pay | Admitting: Cardiovascular Disease

## 2013-02-24 VITALS — BP 156/80 | HR 68 | Ht 63.0 in | Wt 205.6 lb

## 2013-02-24 DIAGNOSIS — R002 Palpitations: Secondary | ICD-10-CM

## 2013-02-24 DIAGNOSIS — E039 Hypothyroidism, unspecified: Secondary | ICD-10-CM

## 2013-02-24 DIAGNOSIS — I10 Essential (primary) hypertension: Secondary | ICD-10-CM

## 2013-02-24 DIAGNOSIS — F411 Generalized anxiety disorder: Secondary | ICD-10-CM

## 2013-02-24 DIAGNOSIS — E782 Mixed hyperlipidemia: Secondary | ICD-10-CM

## 2013-02-24 DIAGNOSIS — G4733 Obstructive sleep apnea (adult) (pediatric): Secondary | ICD-10-CM

## 2013-02-24 MED ORDER — HYDRALAZINE HCL 25 MG PO TABS: 25.0000 mg | ORAL_TABLET | Freq: Two times a day (BID) | ORAL | Status: DC

## 2013-02-24 NOTE — Patient Instructions (Addendum)
Your physician has recommended you make the following change in your medication: Increase the hydralazine to 25 mg twice daily.  Your physician recommends that you schedule a follow-up appointment in: 3 MONTHS.  Your physician recommends that you return for lab work fasting.

## 2013-02-24 NOTE — Progress Notes (Signed)
Patient ID: Brianna Reyes, female   DOB: [birth year], 79 y.o.   MRN: [id number]     HPI: Brianna Reyes is a 79 y.o. female who presents to the office today for cardiology followup evaluation.  Brianna Reyes 79 years old. She has a history of hypertension, palpitations, hypothyroidism, hyperlipidemia, as well as complex sleep apnea. She has documented mild LVH with grade 2 diastolic dysfunction. She does have labile blood pressure at home and she brings with her some record of blood pressures ranging from 130 to 190 systollically with home monitoring. In the past, she was given a prescription for amlodipine but did not take this due to concerns for significant side effects. She had seen Brianna Reyes, [facility] most recently and he gave her a prescription for Hydralazine and  She has been taking this 25 mg at night on an as-needed basis.  She does have complex sleep apnea and has required BiPAP therapy or BiPAP pressure of 22 and an EPAP pressure of 17. She uses it 100% of the time.   She does over-the-counter type medications intermittently. She tells me recently Dr. Renne Reyes who lanced a cyst and she has been on  Augmentin for 10 days due to subsequent infection. She has an appointment to see a dermatologist tomorrow.  She denies chest pressure. She denies PND orthopnea. She is unaware of any recurrent palpitations.  Past Medical History  Diagnosis Date  . Brain tumor   . Hypothyroidism   . Sleep apnea 04/22/08    SLEEP STUDY done @ [facility]. Read by Dr. Fannie Knee. Repeat study done 05/22/11 @ [facility].  . Palpitations 10/24/11    cardionet monitor worn  . Meningioma   . HTN (hypertension) 10/24/11    ECHO; EF=> 55% tHERE IS MILD CONCENTRIC LV HYPERTROPHY. LV SYSTOLIC FUNCTION IS NORMAL. THE TRANSMITRAL SPECTRAL DOPPLER FLOW PATTERN SUGGESTIVE OF PSEUDONORMALIATION.  Marland Kitchen Sleep apnea   . Arthritis     Past Surgical History  Procedure Laterality Date  . Appendectomy    .  Tonsillectomy    . Tubal ligation      No Known Allergies  Current Outpatient Prescriptions  Medication Sig Dispense Refill  . ASTAXANTHIN PO Take 1 tablet by mouth daily.      . beta carotene w/minerals (OCUVITE) tablet Take 1 tablet by mouth daily.      Marland Kitchen co-enzyme Q-10 30 MG capsule Take 30 mg by mouth 3 (three) times daily.      . Ginger, Zingiber officinalis, (GINGER PO) Take by mouth.      . levothyroxine (SYNTHROID, LEVOTHROID) 88 MCG tablet Take 88 mcg by mouth daily before breakfast.      . lisinopril (PRINIVIL,ZESTRIL) 20 MG tablet       . lisinopril-hydrochlorothiazide (PRINZIDE,ZESTORETIC) 20-12.5 MG per tablet Take 1 tablet by mouth every morning.      . LUTEIN-ZEAXANTHIN PO Take 1 tablet by mouth daily.      . metoprolol (LOPRESSOR) 50 MG tablet Take 1.5 tablets (75 mg total) by mouth 2 (two) times daily.  90 tablet  5  . Misc Natural Products (LUTEIN 20 PO) Take 1 capsule by mouth daily.      . Multiple Vitamin (MULTIVITAMIN WITH MINERALS) TABS tablet Take 1 tablet by mouth daily. Contains vitamin C      . NON FORMULARY CPAP THERAPY      . omega-3 acid ethyl esters (LOVAZA) 1 G capsule Take 1 g by mouth daily.       Marland Kitchen  OVER THE COUNTER MEDICATION Take 2 tablets by mouth 2 (two) times daily. Alleviate-natural otc medication for joints - ginger, baswillow (?), vit C, manganese, willow bark      . hydrALAZINE (APRESOLINE) 25 MG tablet Take 1 tablet (25 mg total) by mouth 2 (two) times daily.  60 tablet  6   No current facility-administered medications for this visit.    History   Social History  . Marital Status: Single    Spouse Name: N/A    Number of Children: N/A  . Years of Education: N/A   Occupational History  . Not on file.   Social History Main Topics  . Smoking status: Never Smoker   . Smokeless tobacco: Never Used  . Alcohol Use: No  . Drug Use: No  . Sexual Activity: No   Other Topics Concern  . Not on file   Social History Narrative  . No  narrative on file    Family History  Problem Relation Age of Onset  . Heart disease Brother     ischemic  . Hypertension    . Stroke      ROS is negative for fevers, chills or night sweats.  she does complain of infection on her back at the site of previous cyst. She denies visual changes. She denies wheezing. She denies PND or orthopnea. She uses her CPAP on for some time. She denies residual daytime sleepiness. She denies recurrent palpitations. She does admit to having periods of anxiety and her blood pressure at times has risen to 198 at home. She denies nausea vomiting diarrhea. She denies blood in stool or urine. She denies dysuria. She denies myalgias. She does have some intermittent or paralysis. She denies paresthesias. She denies tremor. She denies seizures. She does not have diabetes. She does have hypothyroidism on Synthroid replacement.   Other comprehensive 12 point system review is negative.  PE BP 156/80  Pulse 68  Ht 5\' 3"  (1.6 m)  Wt 205 lb 9.6 oz (93.26 kg)  BMI 36.43 kg/m2  General: Alert, oriented, no distress.  Skin: normal turgor, no rashes HEENT: Normocephalic, atraumatic. Pupils round and reactive; sclera anicteric;no lid lag.  Nose without nasal septal hypertrophy Mouth/Parynx benign; Mallinpatti scale 3 Neck: No JVD, no carotid bruitts Chest: No chest wall tenderness to palpation to Lungs: clear to ausculatation and percussion; no wheezing or rales Heart: RRR, s1 s2 normal  1/6 systolic murmur  Abdomen: soft, nontender; no hepatosplenomehaly, BS+; abdominal aorta nontender and not dilated by palpation. Back: No CVA  Pulses 2+ Extremities: no clubbing cyanosis or edema, Homan's sign negative  Neurologic: grossly nonfocal Psychologic: normal affect and mood.  ECG: Sinus rhythm at 68 beats per minute. No ectopy per  LABS:  BMET    Component Value Date/Time   NA 137 09/21/2012 0530   K 3.7 09/21/2012 0530   CL 106 09/21/2012 0530   CO2 25 09/21/2012  0530   GLUCOSE 100* 09/21/2012 0530   BUN 17 09/21/2012 0530   CREATININE 0.82 09/21/2012 0530   CALCIUM 8.7 09/21/2012 0530   GFRNONAA 66* 09/21/2012 0530   GFRAA 76* 09/21/2012 0530     Hepatic Function Panel     Component Value Date/Time   PROT 7.3 09/18/2012 2307   ALBUMIN 3.6 09/18/2012 2307   AST 24 09/18/2012 2307   ALT 21 09/18/2012 2307   ALKPHOS 76 09/18/2012 2307   BILITOT 0.6 09/18/2012 2307     CBC    Component Value Date/Time  WBC 6.0 09/21/2012 0530   RBC 3.62* 09/21/2012 0530   HGB 11.1* 09/21/2012 0530   HCT 33.7* 09/21/2012 0530   PLT 177 09/21/2012 0530   MCV 93.1 09/21/2012 0530   MCH 30.7 09/21/2012 0530   MCHC 32.9 09/21/2012 0530   RDW 14.7 09/21/2012 0530   LYMPHSABS 0.6* 09/18/2012 2307   MONOABS 0.6 09/18/2012 2307   EOSABS 0.1 09/18/2012 2307   BASOSABS 0.0 09/18/2012 2307     BNP No results found for this basename: probnp    Lipid Panel  No results found for this basename: chol, trig, hdl, cholhdl, vldl, ldlcalc     RADIOLOGY: No results found.    ASSESSMENT AND PLAN:  Brianna Reyes is 79 year old female who has a history of labile hypertension, no history of palpitations, hyperlipidemia, obesity, and complex sleep apnea. She stopped with normal systolic function with Doppler suggestive of grade 2 diastolic dysfunction. She is moderately dilatation. There is mitral annular calcification. She is aortic valve sclerosis with mild they are very mild stenosis. Presently, electing to have hydralazine 25 mg twice a day to her medical regimen. In the past we did try amlodipine but she refused to take this due to concerns for side effects. She is on metoprolol 75 twice a day with a resting pulse of 68. I am checking laboratory in the fasting state. I will see her back in the office in 3 months for followup evaluation.     Lennette Bihari, MD, [facility]  02/24/2013 4:08 PM

## 2013-03-13 ENCOUNTER — Telehealth: Payer: Self-pay | Admitting: Cardiology

## 2013-03-13 ENCOUNTER — Other Ambulatory Visit: Payer: Self-pay | Admitting: Cardiology

## 2013-03-13 MED ORDER — HYDRALAZINE HCL 25 MG PO TABS: 25.0000 mg | ORAL_TABLET | Freq: Three times a day (TID) | ORAL | Status: DC

## 2013-03-13 NOTE — Telephone Encounter (Signed)
Brianna Reyes called with elevated BP that awakens her from sleep.  She states her face begins burning and she wakes up with BP systolic of 465.  She is upset about these high numbers.  We discussed choices of BP meds- previously she has not wished to use norvasc due to a TV MD that did not recommend it.  She would be willing to go on Norvasc now but first we will increase hydralazine to 3 X a day to see if that improves her BP.  Though this may be causing her face to burn.  She will try the increased freq of hydralazine for now.  She will call back to add norvasc.   I reordered her hydralazine for TID 25 mg.

## 2013-05-02 ENCOUNTER — Ambulatory Visit (INDEPENDENT_AMBULATORY_CARE_PROVIDER_SITE_OTHER): Payer: Medicare Other | Admitting: Physician Assistant

## 2013-05-02 ENCOUNTER — Encounter: Payer: Self-pay | Admitting: Physician Assistant

## 2013-05-02 VITALS — BP 158/78 | HR 70 | Ht 62.0 in | Wt 204.0 lb

## 2013-05-02 DIAGNOSIS — I4949 Other premature depolarization: Secondary | ICD-10-CM

## 2013-05-02 DIAGNOSIS — I10 Essential (primary) hypertension: Secondary | ICD-10-CM

## 2013-05-02 DIAGNOSIS — G4733 Obstructive sleep apnea (adult) (pediatric): Secondary | ICD-10-CM

## 2013-05-02 DIAGNOSIS — I493 Ventricular premature depolarization: Secondary | ICD-10-CM

## 2013-05-02 MED ORDER — HYDRALAZINE HCL 50 MG PO TABS: 50.0000 mg | ORAL_TABLET | Freq: Three times a day (TID) | ORAL | Status: DC

## 2013-05-02 NOTE — Patient Instructions (Signed)
1.  Increase Hydralazine to 50mg  three times daily.  2.  Follow up in two weeks for BP check.

## 2013-05-02 NOTE — Assessment & Plan Note (Signed)
I will increase hydralazine to 50 mg 3 times daily. Patient will present back in 2 weeks' time for blood pressure check with the R.N.  and also had a talk about decreasing sodium intake as this can have the serious effects on blood pressure.

## 2013-05-02 NOTE — Progress Notes (Signed)
Date:  05/02/2013   ID:  Brianna Reyes, DOB 11/25/1930, MRN [id number]  PCP:  Horatio Pel, MD  Primary Cardiologist:  Claiborne Billings     History of Present Illness: Brianna Reyes is a 80 y.o. female a history of hypertension, palpitations, hypothyroidism, hyperlipidemia, as well as complex sleep apnea. She has documented mild LVH with grade 2 diastolic dysfunction. She does have labile blood pressure at home and she brings with her some record of blood pressures ranging from 272 to 536 systollically with home monitoring. In the past, she was given a prescription for amlodipine but did not take this due to concerns for significant side effects.   She does have complex sleep apnea and has required BiPAP therapy or BiPAP pressure of 22 and an EPAP pressure of 17. She uses it 100% of the time.   He presented today with elevated blood pressure. Over the last day and ranged systolically from 644-034 with diastolic values of 74-25. Patient admits to sodium indiscretion. She said increasing lower extremity edema. She's concerned about a stroke with the extreme low pressure. He currently denies any neurological symptoms.  The patient currently denies nausea, vomiting, fever, chest pain, shortness of breath, orthopnea, dizziness, PND, cough, congestion, abdominal pain, hematochezia, melena.   Wt Readings from Last 3 Encounters:  05/02/13 204 lb (92.534 kg)  02/24/13 205 lb 9.6 oz (93.26 kg)  10/28/12 206 lb 11.2 oz (93.759 kg)     Past Medical History  Diagnosis Date  . Brain tumor   . Hypothyroidism   . Sleep apnea 04/22/08    SLEEP STUDY done @ [facility]. Read by Dr. Keturah Barre. Repeat study done 05/22/11 @ [facility].  . Palpitations 10/24/11    cardionet monitor worn  . Meningioma   . HTN (hypertension) 10/24/11    ECHO; EF=> 55% tHERE IS MILD CONCENTRIC LV HYPERTROPHY. LV SYSTOLIC FUNCTION IS NORMAL. THE TRANSMITRAL SPECTRAL DOPPLER FLOW PATTERN SUGGESTIVE OF  PSEUDONORMALIATION.  Marland Kitchen Sleep apnea   . Arthritis     Current Outpatient Prescriptions  Medication Sig Dispense Refill  . ASTAXANTHIN PO Take 1 tablet by mouth daily.      . beta carotene w/minerals (OCUVITE) tablet Take 1 tablet by mouth daily.      Marland Kitchen co-enzyme Q-10 30 MG capsule Take 30 mg by mouth 3 (three) times daily.      . Ginger, Zingiber officinalis, (GINGER PO) Take by mouth.      . hydrALAZINE (APRESOLINE) 50 MG tablet Take 1 tablet (50 mg total) by mouth 3 (three) times daily.  90 tablet  6  . levothyroxine (SYNTHROID, LEVOTHROID) 88 MCG tablet Take 88 mcg by mouth daily before breakfast.      . lisinopril (PRINIVIL,ZESTRIL) 20 MG tablet       . lisinopril-hydrochlorothiazide (PRINZIDE,ZESTORETIC) 20-12.5 MG per tablet Take 1 tablet by mouth every morning.      . LUTEIN-ZEAXANTHIN PO Take 1 tablet by mouth daily.      . metoprolol (LOPRESSOR) 50 MG tablet Take 1.5 tablets (75 mg total) by mouth 2 (two) times daily.  90 tablet  5  . Misc Natural Products (LUTEIN 20 PO) Take 1 capsule by mouth daily.      . Multiple Vitamin (MULTIVITAMIN WITH MINERALS) TABS tablet Take 1 tablet by mouth daily. Contains vitamin C      . NON FORMULARY CPAP THERAPY      . omega-3 acid ethyl esters (LOVAZA) 1 G capsule  Take 1 g by mouth daily.       Marland Kitchen OVER THE COUNTER MEDICATION Take 2 tablets by mouth 2 (two) times daily. Alleviate-natural otc medication for joints - ginger, baswillow (?), vit C, manganese, willow bark       No current facility-administered medications for this visit.    Allergies:   No Known Allergies  Social History:  The patient  reports that she has never smoked. She has never used smokeless tobacco. She reports that she does not drink alcohol or use illicit drugs.   Family history:   Family History  Problem Relation Age of Onset  . Heart disease Brother     ischemic  . Hypertension    . Stroke      ROS:  Please see the history of present illness.  All other systems  reviewed and negative.   PHYSICAL EXAM: VS:  BP 158/78  Pulse 70  Ht 5\' 2"  (1.575 m)  Wt 204 lb (92.534 kg)  BMI 37.30 kg/m2 Obese, well developed, in no acute distress HEENT: Pupils are equal round react to light accommodation extraocular movements are intact.  Neck: no JVDNo cervical lymphadenopathy. Cardiac: Regular rate and rhythm without murmurs rubs or gallops occasional skipped beat. Lungs:  clear to auscultation bilaterally, no wheezing, rhonchi or rales Ext: 1+ lower extremity edema.  2+ radial and dorsalis pedis pulses. Skin: warm and dry Neuro:  Grossly normal  EKG:   Normal sinus rhythm rate of 70 beats per minute    ASSESSMENT AND PLAN:  Problem List Items Addressed This Visit   OBSTRUCTIVE SLEEP APNEA   HTN (hypertension)     I will increase hydralazine to 50 mg 3 times daily. Patient will present back in 2 weeks' time for blood pressure check with the R.N.  and also had a talk about decreasing sodium intake as this can have the serious effects on blood pressure.    Relevant Medications      hydrALAZINE (APRESOLINE) tablet    Other Visit Diagnoses   PVC's (premature ventricular contractions)    -  Primary    Relevant Orders       EKG 12-Lead

## 2013-05-14 ENCOUNTER — Ambulatory Visit: Payer: Medicare Other | Admitting: Physician Assistant

## 2013-05-28 ENCOUNTER — Encounter: Payer: Self-pay | Admitting: Physician Assistant

## 2013-05-28 ENCOUNTER — Ambulatory Visit (INDEPENDENT_AMBULATORY_CARE_PROVIDER_SITE_OTHER): Payer: Medicare Other | Admitting: Physician Assistant

## 2013-05-28 VITALS — BP 180/80 | HR 80 | Ht 62.0 in | Wt 204.3 lb

## 2013-05-28 DIAGNOSIS — I10 Essential (primary) hypertension: Secondary | ICD-10-CM

## 2013-05-28 NOTE — Assessment & Plan Note (Signed)
The patient has not been very consistent with taking her blood pressure medicines seem times a day she is essentially taking in a somewhat her blood pressure currently is. I've asked her to take her lisinopril and Toprol as directed previously in the morning and evening. And take 50 mg of hydralazine at noon and 50 in the evening. I asked her to stop changing around the times of day so that she can get it consistent concentration in her blood. Also asked her to track her blood pressure twice daily and write it down to the right she follows up we have an idea what, is going on.

## 2013-05-28 NOTE — Patient Instructions (Signed)
1.  Take hydralazine 50mg  at noon and 8pm.  Track BP twice a day 8AM,  8PM 2.  Follow up with Dr. Claiborne Billings three months. 3.  STOP dosing your meds based on your BP.

## 2013-05-28 NOTE — Progress Notes (Signed)
Date:  05/28/2013   ID:  Brianna Reyes, DOB 11-08-1930, MRN [id number]  PCP:  Horatio Pel, MD  Primary Cardiologist:  Claiborne Billings    History of Present Illness: Brianna Reyes is a 80 y.o. female a history of hypertension, palpitations, hypothyroidism, hyperlipidemia, as well as complex sleep apnea. She has documented mild LVH with grade 2 diastolic dysfunction. She does have labile blood pressure at home and she brings with her some record of blood pressures ranging from 315 to 400 systollically with home monitoring. In the past, she was given a prescription for amlodipine but did not take this due to concerns for significant side effects.  She does have complex sleep apnea and has required BiPAP therapy or BiPAP pressure of 22 and an EPAP pressure of 17. She uses it 100% of the time.   Her last office visit she presented with elevated blood pressures as high as 867 systolic. I increased her hydralazine to 50 mg 3 times a day. She is presenting today in followup. She's been essentially dosing the hydralazine as needed based on her blood pressure.  She states that her blood pressures typically lower in the morning so she's not taking hydralazine at that time she'll take it later in the afternoon when her blood pressure increases. Sometimes she'll take her other medications 2 hours earlier.   She does get some right lower extremity edema at times.  She is deathly concerned that her blood pressure is affecting her heart in a negative way.    The patient currently denies nausea, vomiting, fever, chest pain, shortness of breath, orthopnea, dizziness, PND, cough, congestion, abdominal pain, hematochezia, melena  Wt Readings from Last 3 Encounters:  05/28/13 204 lb 4.8 oz (92.67 kg)  05/02/13 204 lb (92.534 kg)  02/24/13 205 lb 9.6 oz (93.26 kg)     Past Medical History  Diagnosis Date  . Brain tumor   . Hypothyroidism   . Sleep apnea 04/22/08    SLEEP STUDY done @ [facility]. Read by Dr.  Keturah Barre. Repeat study done 05/22/11 @ [facility]  . Palpitations 10/24/11    cardionet monitor worn  . Meningioma   . HTN (hypertension) 10/24/11    ECHO; EF=> 55% tHERE IS MILD CONCENTRIC LV HYPERTROPHY. LV SYSTOLIC FUNCTION IS NORMAL. THE TRANSMITRAL SPECTRAL DOPPLER FLOW PATTERN SUGGESTIVE OF PSEUDONORMALIATION.  Marland Kitchen Sleep apnea   . Arthritis     Current Outpatient Prescriptions  Medication Sig Dispense Refill  . ASTAXANTHIN PO Take 1 tablet by mouth daily.      . beta carotene w/minerals (OCUVITE) tablet Take 1 tablet by mouth daily.      Marland Kitchen co-enzyme Q-10 30 MG capsule Take 30 mg by mouth 3 (three) times daily.      . Ginger, Zingiber officinalis, (GINGER PO) Take by mouth.      . hydrALAZINE (APRESOLINE) 50 MG tablet Take 1 tablet (50 mg total) by mouth 3 (three) times daily.  90 tablet  6  . levothyroxine (SYNTHROID, LEVOTHROID) 88 MCG tablet Take 88 mcg by mouth daily before breakfast.      . lisinopril (PRINIVIL,ZESTRIL) 20 MG tablet       . lisinopril-hydrochlorothiazide (PRINZIDE,ZESTORETIC) 20-12.5 MG per tablet Take 1 tablet by mouth every morning.      . LUTEIN-ZEAXANTHIN PO Take 1 tablet by mouth daily.      . metoprolol (LOPRESSOR) 50 MG tablet Take 1.5 tablets (75 mg total) by mouth 2 (two) times  daily.  90 tablet  5  . Misc Natural Products (LUTEIN 20 PO) Take 1 capsule by mouth daily.      . Multiple Vitamin (MULTIVITAMIN WITH MINERALS) TABS tablet Take 1 tablet by mouth daily. Contains vitamin C      . NON FORMULARY CPAP THERAPY      . omega-3 acid ethyl esters (LOVAZA) 1 G capsule Take 1 g by mouth daily.       Marland Kitchen OVER THE COUNTER MEDICATION Take 2 tablets by mouth 2 (two) times daily. Alleviate-natural otc medication for joints - ginger, baswillow (?), vit C, manganese, willow bark       No current facility-administered medications for this visit.    Allergies:   No Known Allergies  Social History:  The patient  reports that she has never  smoked. She has never used smokeless tobacco. She reports that she does not drink alcohol or use illicit drugs.   Family history:   Family History  Problem Relation Age of Onset  . Heart disease Brother     ischemic  . Hypertension    . Stroke      ROS:  Please see the history of present illness.  All other systems reviewed and negative.   PHYSICAL EXAM: VS:  BP 180/80  Pulse 80  Ht 5\' 2"  (1.575 m)  Wt 204 lb 4.8 oz (92.67 kg)  BMI 37.36 kg/m2 Obese, well developed, in no acute distress HEENT: Pupils are equal round react to light accommodation extraocular movements are intact.  Neck: no JVDNo cervical lymphadenopathy. Cardiac: Regular rate and rhythm with 1/6 systolic murmur in the axilla  Lungs:  clear to auscultation bilaterally, no wheezing, rhonchi or rales Ext: no lower extremity edema.  2+ radial and dorsalis pedis pulses. Skin: warm and dry Neuro:  Grossly normal  ASSESSMENT AND PLAN:  Problem List Items Addressed This Visit   HTN (hypertension) - Primary     The patient has not been very consistent with taking her blood pressure medicines seem times a day she is essentially taking in a somewhat her blood pressure currently is. I've asked her to take her lisinopril and Toprol as directed previously in the morning and evening. And take 50 mg of hydralazine at noon and 50 in the evening. I asked her to stop changing around the times of day so that she can get it consistent concentration in her blood. Also asked her to track her blood pressure twice daily and write it down to the right she follows up we have an idea what, is going on.

## 2013-06-03 ENCOUNTER — Telehealth: Payer: Self-pay | Admitting: Physician Assistant

## 2013-06-03 NOTE — Telephone Encounter (Signed)
Dr. Claiborne Billings and Gaspar Bidding are not available for several weeks.  RN asked Dr. Debara Pickett if pt could see Erasmo Downer, PharmD and agreed.   Returned call and informed pt per instructions by MD/PA.  Pt verbalized understanding and agreed w/ plan.  Appt scheduled for tomorrow at 3:20 pm for BP Check w/ Erasmo Downer.  Pt also advised to take hydralazine every 8 hours and not only when BP is elevated.  Pt verbalized understanding and agreed w/ plan.

## 2013-06-03 NOTE — Telephone Encounter (Signed)
Please call asap,having poblem with her high blood pressure. She have not been able to sleep,its been up at 200.

## 2013-06-03 NOTE — Telephone Encounter (Signed)
Returned call and pt verified x 2.  Pt stated last night she had an awful episode of elevated BP.  Stated she took all of the medicine like they told her.  Pt asking for something to be prescribed that is compatible with what she is taking now.  Pt asking for a prescription she can take when her BP goes up.  Pt stated she did take 3 doses of hydralazine last night even though Gaspar Bidding told her last week to take it twice a day.  BP 203/81 early this morning and came down to 170s-180s/60-80s.  Today BP 182/66 (6:30am) 181/73 (11 am, took AM meds) and (152/68 (12 pm).    Pt requested a 4th BP med to take prn elevated BP.  Stated this doesn't happen all of the time and she needs something b/c her heart can't take it being this high.  Pt informed Dr. Claiborne Billings and Gaspar Bidding [facility]) are out of the office today and another provider will be notified to review chart for further instructions.  Pt also informed she may not receive another medication and should allow the doctor to advise best plan.  Pt verbalized understanding and agreed w/ plan.  Message forwarded to DOD (Dr. Debara Pickett) to review and advise, as primary cardiologist is out of the office.   Elevated BP (intermittent per pt)  Requesting another BP med for prn elevated BP  Current Medications  Hydralazine 50 mg BID (per BH at [facility] last week)  Lisinopril-hctz 20-12.5 mg q AM  Lisinopril 20 mg q PM  Metoprolol 75 mg BID

## 2013-06-03 NOTE — Telephone Encounter (Signed)
Returned call.  Line busy x 2.  Will try again later.

## 2013-06-03 NOTE — Telephone Encounter (Signed)
She can take the hydralazine 50 mg up to 3x daily - every 8 hours.  Will need to follow-up with Dr. Claiborne Billings or Gaspar Bidding.  -Dr. Debara Pickett

## 2013-06-04 ENCOUNTER — Ambulatory Visit (INDEPENDENT_AMBULATORY_CARE_PROVIDER_SITE_OTHER): Payer: Medicare Other | Admitting: Pharmacist Clinician (PhC)/ Clinical Pharmacy Specialist

## 2013-06-04 VITALS — BP 158/64 | HR 64 | Ht 62.0 in | Wt 202.0 lb

## 2013-06-04 DIAGNOSIS — I10 Essential (primary) hypertension: Secondary | ICD-10-CM

## 2013-06-04 NOTE — Patient Instructions (Addendum)
Return for a a follow up appointment in 2 months  Your blood pressure today is  158/64   Check your blood pressure at home daily (if able) and keep record of the readings.  Take am hydralazine with morning meds, take mid-day dose around 2pm then 3rd dose around 10-11 pm  Bring all of your meds, your BP cuff and your record of home blood pressures to your next appointment.  Exercise as you're able, try to walk approximately 30 minutes per day.  Keep salt intake to a minimum, especially watch canned and prepared boxed foods.  Eat more fresh fruits and vegetables and fewer canned items.  Avoid eating in fast food restaurants.    HOW TO TAKE YOUR BLOOD PRESSURE:   Rest 5 minutes before taking your blood pressure.    Don't smoke or drink caffeinated beverages for at least 30 minutes before.   Take your blood pressure before (not after) you eat.   Sit comfortably with your back supported and both feet on the floor (don't cross your legs).   Elevate your arm to heart level on a table or a desk.   Use the proper sized cuff. It should fit smoothly and snugly around your bare upper arm. There should be enough room to slip a fingertip under the cuff. The bottom edge of the cuff should be 1 inch above the crease of the elbow.   Ideally, take 3 measurements at one sitting and record the average.

## 2013-06-05 ENCOUNTER — Encounter: Payer: Self-pay | Admitting: Pharmacist Clinician (PhC)/ Clinical Pharmacy Specialist

## 2013-06-05 NOTE — Progress Notes (Signed)
06/05/2013 Brianna Reyes 03-Dec-1930 [id number]   HPI:  Brianna Reyes is a 80 y.o. female patient of Dr Claiborne Billings, with a PMH below who presents today for a blood pressure check.  She has been seen several times in the past few months regarding her blood pressure.  She has been known to take her medications as she feels the need, rather than daily as directed.  She also takes her blood pressure multiple times per day and worries excessively about any high readings.  She states that for the past week she has been taking her medications as directed (metoprolol 75mg  bid, lisinopril hct 20/12.5 qam, lisinopril 20mg  qpm and hydralazine 50mg  tid).  When about timing of her meds she states that she takes the am hydralazine dose about 2 hours after lisinopril hct and metoprolol; then second dose around 6-8 pm and the third dose around 2 am.  When asked about the 2am time, she states that because her night pressure is usually the highest, she's afraid to go to sleep, so she stays up and continues to monitor her BP every hour or more.  Today she is most concerned about the evening readings.  She states that she'll go to bed with a BP of 140/60, then put on her BiPAP and go to sleep.  She states that within a few hours she'll wake up with a gasp and check her BP to find it 203/81.  Then she won't sleep the rest of the night because she's afraid of having a stroke or MI.  Apparently this happens more nights than not.  She believes the hypertension is caused by her faulty BiPAP, for which she has an appointment with Dr. Claiborne Billings in about 2 months, and a brain tumor.  She has a diagnosis of benign neoplasm of the brain, but I will have to get her paper chart out to see if I can find out more information as to exactly what type of tumor she had, or if it's even the type that could potentially affect blood pressure.  Her family history includes her father having hypertension as does one of her 6 siblings.  Her mother died of a  stoke at the age of 34.  She has 4 children, none of whom have any hypertensive issues to date.  She has been recently trying to lose weight and today is down 2 pounds.    Current Outpatient Prescriptions  Medication Sig Dispense Refill  . ASTAXANTHIN PO Take 1 tablet by mouth daily.      . beta carotene w/minerals (OCUVITE) tablet Take 1 tablet by mouth daily.      Marland Kitchen co-enzyme Q-10 30 MG capsule Take 30 mg by mouth 3 (three) times daily.      . Ginger, Zingiber officinalis, (GINGER PO) Take by mouth.      . hydrALAZINE (APRESOLINE) 50 MG tablet Take 1 tablet (50 mg total) by mouth 3 (three) times daily.  90 tablet  6  . levothyroxine (SYNTHROID, LEVOTHROID) 88 MCG tablet Take 88 mcg by mouth daily before breakfast.      . lisinopril (PRINIVIL,ZESTRIL) 20 MG tablet       . lisinopril-hydrochlorothiazide (PRINZIDE,ZESTORETIC) 20-12.5 MG per tablet Take 1 tablet by mouth every morning.      . LUTEIN-ZEAXANTHIN PO Take 1 tablet by mouth daily.      . metoprolol (LOPRESSOR) 50 MG tablet Take 1.5 tablets (75 mg total) by mouth 2 (two) times daily.  90 tablet  5  . Misc Natural Products (LUTEIN 20 PO) Take 1 capsule by mouth daily.      . Multiple Vitamin (MULTIVITAMIN WITH MINERALS) TABS tablet Take 1 tablet by mouth daily. Contains vitamin C      . NON FORMULARY CPAP THERAPY      . omega-3 acid ethyl esters (LOVAZA) 1 G capsule Take 1 g by mouth daily.       Marland Kitchen OVER THE COUNTER MEDICATION Take 2 tablets by mouth 2 (two) times daily. Alleviate-natural otc medication for joints - ginger, baswillow (?), vit C, manganese, willow bark       No current facility-administered medications for this visit.    No Known Allergies  Past Medical History  Diagnosis Date  . Brain tumor   . Hypothyroidism   . Sleep apnea 04/22/08    SLEEP STUDY done @ [facility]. Read by Dr. Keturah Barre. Repeat study done 05/22/11 @ [facility].  . Palpitations 10/24/11    cardionet monitor worn  .  Meningioma   . HTN (hypertension) 10/24/11    ECHO; EF=> 55% tHERE IS MILD CONCENTRIC LV HYPERTROPHY. LV SYSTOLIC FUNCTION IS NORMAL. THE TRANSMITRAL SPECTRAL DOPPLER FLOW PATTERN SUGGESTIVE OF PSEUDONORMALIATION.  Marland Kitchen Sleep apnea   . Arthritis     Blood pressure 158/64, pulse 64, height 5\' 2"  (1.575 m), weight 202 lb (91.627 kg).  Standing 164/60.     ASSESSMENT AND PLAN: Today [name]'s blood pressure is slightly elevated above the JNC goal of <150/90.  She appears to have an isolated systolic hypertension.  Her home BP cuff was compared to our office reading and found to be within 5 points.  She is quite upset and wants a prescription for something to bring her BP back down to normal at night so she can go back to sleep.   I explained that if her BP rise is due to a malfunctioning BiPAP, then we need to get her in to see Dr. Claiborne Billings for a sleep appointment as soon as available.  I also tried to explain that part of the reason her BP doesn't drop after the night-time highs is because she's staying awake waiting to have a stroke.  I suggested that if she take some deep calming breaths, relax and go back to bed, her BP would probably even out on its own more quickly than with her worrying the night away.  We also talked about timing of medications and why she should never skip a dose just because her pressure looks "low" at the time she needs a dose.  I asked her to move the am hydralazine dose to earlier with her other meds, then take the second dose around 2-3pm and the last dose around 10pm with evening meds.  She has complained that her pressure starts to rise daily around 4-5pm, but never took her mid-day hydralazine until at least 6pm.   I assured her that the medication did not need to be exactly 8 hours between doses and that by taking it prior to her evening rise, maybe this would help keep the reading down.  I will look into her paper chart to see if there is any indication of what type of "benign  brain tumor" she was diagnosed with, but unless was a type to affect her pituitary gland, I think this can be ruled out.  Once her sleep apnea is sorted out I would recommend a BP follow up.  If she still has some  isolated systolic hypertension, we might consider getting renal dopplers to rule out RAS.   I asked her to come back in 2 months for a follow up appointment.    Tommy Medal PharmD CPP [facility]

## 2013-07-14 ENCOUNTER — Ambulatory Visit (INDEPENDENT_AMBULATORY_CARE_PROVIDER_SITE_OTHER): Payer: Medicare Other | Admitting: Cardiovascular Disease

## 2013-07-14 ENCOUNTER — Encounter: Payer: Self-pay | Admitting: Cardiovascular Disease

## 2013-07-14 VITALS — BP 132/63 | HR 57 | Ht 62.0 in | Wt 203.7 lb

## 2013-07-14 DIAGNOSIS — R5381 Other malaise: Secondary | ICD-10-CM

## 2013-07-14 DIAGNOSIS — I10 Essential (primary) hypertension: Secondary | ICD-10-CM

## 2013-07-14 DIAGNOSIS — E782 Mixed hyperlipidemia: Secondary | ICD-10-CM

## 2013-07-14 DIAGNOSIS — G4733 Obstructive sleep apnea (adult) (pediatric): Secondary | ICD-10-CM

## 2013-07-14 DIAGNOSIS — IMO0002 Reserved for concepts with insufficient information to code with codable children: Secondary | ICD-10-CM

## 2013-07-14 DIAGNOSIS — Z79899 Other long term (current) drug therapy: Secondary | ICD-10-CM

## 2013-07-14 DIAGNOSIS — R5383 Other fatigue: Secondary | ICD-10-CM

## 2013-07-14 DIAGNOSIS — E039 Hypothyroidism, unspecified: Secondary | ICD-10-CM

## 2013-07-14 DIAGNOSIS — R002 Palpitations: Secondary | ICD-10-CM

## 2013-07-14 NOTE — Patient Instructions (Signed)
Your physician recommends that you schedule a follow-up appointment in: 6 Months  Your physician recommends that you return for lab work

## 2013-07-14 NOTE — Progress Notes (Signed)
Patient ID: Brianna Reyes, female   DOB: 05-11-1930, 80 y.o.   MRN: [id number]     HPI: Brianna Reyes is a 80 y.o. female who presents to the office today for cardiology followup evaluation.  Brianna Reyes has a history of hypertension, palpitations, hypothyroidism, hyperlipidemia, as well as complex sleep apnea. She has documented mild LVH with grade 2 diastolic dysfunction. She does a history of previous  labile blood pressure at home a bit in the past.  These have ranged from 250 to 539 systolically.  Recently, her medications have been titrated says she now is taking metoprolol 75 mg twice a day, hydralazine, 50 mg 3 times a day, and she takes a total dose of lisinopril 40 mg and HCTZ 12.5 mg daily.  She brought with her to the office today.  Her blood pressure recordings at home and these have markedly improved and now typically are in between 115 and 140.  She does have complex sleep apnea and has required BiPAP therapy or BiPAP pressure of 22 and an EPAP pressure of 17. She uses it 100% of the time.  She feels that sleep is sleeping much better.  She is also concerned that she may at times.  Still be stopping breathing.  She cannot recall when her last time her unit was downloaded.  She denies chest pressure. She denies PND orthopnea. She is unaware of any recurrent palpitations.  She denies tremor.  She presents for evaluation.  Past Medical History  Diagnosis Date  . Brain tumor   . Hypothyroidism   . Sleep apnea 04/22/08    SLEEP STUDY done @ [facility]. Read by Dr. Keturah Barre. Repeat study done 05/22/11 @ [facility].  . Palpitations 10/24/11    cardionet monitor worn  . Meningioma   . HTN (hypertension) 10/24/11    ECHO; EF=> 55% tHERE IS MILD CONCENTRIC LV HYPERTROPHY. LV SYSTOLIC FUNCTION IS NORMAL. THE TRANSMITRAL SPECTRAL DOPPLER FLOW PATTERN SUGGESTIVE OF PSEUDONORMALIATION.  Marland Kitchen Sleep apnea   . Arthritis     Past Surgical History  Procedure Laterality Date  .  Appendectomy    . Tonsillectomy    . Tubal ligation      No Known Allergies  Current Outpatient Prescriptions  Medication Sig Dispense Refill  . ASTAXANTHIN PO Take 1 tablet by mouth daily.      . beta carotene w/minerals (OCUVITE) tablet Take 1 tablet by mouth daily.      Marland Kitchen co-enzyme Q-10 30 MG capsule Take 30 mg by mouth 3 (three) times daily.      . Ginger, Zingiber officinalis, (GINGER PO) Take by mouth.      . hydrALAZINE (APRESOLINE) 50 MG tablet Take 1 tablet (50 mg total) by mouth 3 (three) times daily.  90 tablet  6  . levothyroxine (SYNTHROID, LEVOTHROID) 88 MCG tablet Take 88 mcg by mouth daily before breakfast.      . lisinopril (PRINIVIL,ZESTRIL) 20 MG tablet Take 20 mg by mouth daily.       Marland Kitchen lisinopril-hydrochlorothiazide (PRINZIDE,ZESTORETIC) 20-12.5 MG per tablet Take 1 tablet by mouth every morning.      . LUTEIN-ZEAXANTHIN PO Take 1 tablet by mouth daily.      . metoprolol (LOPRESSOR) 50 MG tablet Take 1.5 tablets (75 mg total) by mouth 2 (two) times daily.  90 tablet  5  . Misc Natural Products (LUTEIN 20 PO) Take 1 capsule by mouth daily.      . Multiple Vitamin (  MULTIVITAMIN WITH MINERALS) TABS tablet Take 1 tablet by mouth daily. Contains vitamin C      . NON FORMULARY CPAP THERAPY      . omega-3 acid ethyl esters (LOVAZA) 1 G capsule Take 1 g by mouth daily.       Marland Kitchen OVER THE COUNTER MEDICATION Take 2 tablets by mouth 2 (two) times daily. Alleviate-natural otc medication for joints - ginger, baswillow (?), vit C, manganese, willow bark       No current facility-administered medications for this visit.    History   Social History  . Marital Status: Single    Spouse Name: N/A    Number of Children: N/A  . Years of Education: N/A   Occupational History  . Not on file.   Social History Main Topics  . Smoking status: Never Smoker   . Smokeless tobacco: Never Used  . Alcohol Use: No  . Drug Use: No  . Sexual Activity: No   Other Topics Concern  . Not on  file   Social History Narrative  . No narrative on file    Family History  Problem Relation Age of Onset  . Heart disease Brother     ischemic  . Hypertension    . Stroke     ROS General: Negative; No fevers, chills, or night sweats;  HEENT: Negative; No changes in vision or hearing, sinus congestion, difficulty swallowing Pulmonary: Negative; No cough, wheezing, shortness of breath, hemoptysis Cardiovascular: Negative; No chest pain, presyncope, syncope, palpatations GI: Negative; No nausea, vomiting, diarrhea, or abdominal pain GU: Negative; No dysuria, hematuria, or difficulty voiding Musculoskeletal: Negative; no myalgias, joint pain, or weakness Hematologic/Oncology: Positive for hypothyroidism; no easy bruising, bleeding Endocrine: Negative; no heat/cold intolerance Neuro: Negative; no changes in balance, headaches Skin: Negative; No rashes or skin lesions Psychiatric: Negative; No behavioral problems, depression Sleep: Positive for complex, sleep apnea, currently on BiPAP therapy.  She is unaware of any breakthrough  snoring, daytime sleepiness, hypersomnolence, bruxism, restless legs, hypnogognic hallucinations, no cataplexy Other comprehensive 14 point system review is negative    PE BP 132/63  Pulse 57  Ht 5\' 2"  (1.575 m)  Wt 203 lb 11.2 oz (92.398 kg)  BMI 37.25 kg/m2  General: Alert, oriented, no distress.  Skin: normal turgor, no rashes HEENT: Normocephalic, atraumatic. Pupils round and reactive; sclera anicteric;no lid lag.  Nose without nasal septal hypertrophy Mouth/Parynx benign; Mallinpatti scale 3 Neck: No JVD, no carotid bruits with normal carotid Chest: No chest wall tenderness to palpation  Lungs: clear to ausculatation and percussion; no wheezing or rales Heart: RRR, s1 s2 normal  1/6 systolic murmur; no S3 or S4 gallop no diastolic murmur, rubs, thrills or heaves.  Abdomen: soft, nontender; no hepatosplenomehaly, BS+; abdominal aorta nontender and  not dilated by palpation. Back: No CVA tenderness  Pulses 2+ Extremities: no clubbing cyanosis or edema, Homan's sign negative  Neurologic: grossly nonfocal Psychologic: normal affect and mood.  ECG (independently read by me): sinus bradycardia 57 beats per minute.  No ectopy.  Prior ECG : Sinus rhythm at 68 beats per minute. No ectopy per  LABS:  BMET    Component Value Date/Time   NA 137 09/21/2012 0530   K 3.7 09/21/2012 0530   CL 106 09/21/2012 0530   CO2 25 09/21/2012 0530   GLUCOSE 100* 09/21/2012 0530   BUN 17 09/21/2012 0530   CREATININE 0.82 09/21/2012 0530   CALCIUM 8.7 09/21/2012 0530   GFRNONAA 66* 09/21/2012 0530  GFRAA 76* 09/21/2012 0530     Hepatic Function Panel     Component Value Date/Time   PROT 7.3 09/18/2012 2307   ALBUMIN 3.6 09/18/2012 2307   AST 24 09/18/2012 2307   ALT 21 09/18/2012 2307   ALKPHOS 76 09/18/2012 2307   BILITOT 0.6 09/18/2012 2307     CBC    Component Value Date/Time   WBC 6.0 09/21/2012 0530   RBC 3.62* 09/21/2012 0530   HGB 11.1* 09/21/2012 0530   HCT 33.7* 09/21/2012 0530   PLT 177 09/21/2012 0530   MCV 93.1 09/21/2012 0530   MCH 30.7 09/21/2012 0530   MCHC 32.9 09/21/2012 0530   RDW 14.7 09/21/2012 0530   LYMPHSABS 0.6* 09/18/2012 2307   MONOABS 0.6 09/18/2012 2307   EOSABS 0.1 09/18/2012 2307   BASOSABS 0.0 09/18/2012 2307     BNP No results found for this basename: probnp    Lipid Panel  No results found for this basename: chol,  trig,  hdl,  cholhdl,  vldl,  ldlcalc     RADIOLOGY: No results found.    ASSESSMENT AND PLAN: Ms. Brecklynn Jian is an 80 year old female with a history of labile hypertension,  history of palpitations, hyperlipidemia, obesity, and complex sleep apnea. She stopped with normal systolic function with Doppler suggestive of grade 2 diastolic dysfunction. She is moderately dilatation. There is mitral annular calcification. She is aortic valve sclerosis with mild they are very mild stenosis.  With further titration  of her hydralazine as well as beta blocker therapy, her blood pressure today, now is fairly well controlled and was 132/63.  I reviewed her BP log from her home.  Assessment these seem to be doing consistently doing well.  She still concerned that she may be intermittently stopping breathing.  Despite using her BiPAP therapy.  I am recommending that a download be obtained.  If she does continue to have potential difficulty in particular, central events, she may require ASV titration.  We discussed the importance of weight loss.  Her body mass index is 37.25 with obesity.  Repeat blood work will be obtained on current medical regimen consisting of a CBC, CMP, lipid panel, and magnesium, and TSH.  Adjustment will be made accordingly if necessary.  I will see her in 6 months for followup evaluation.   Troy Sine, MD, [facility]  07/14/2013 6:33 PM

## 2013-07-15 ENCOUNTER — Other Ambulatory Visit: Payer: Self-pay | Admitting: *Deleted

## 2013-07-15 MED ORDER — LISINOPRIL 20 MG PO TABS: 20.0000 mg | ORAL_TABLET | Freq: Every day | ORAL | Status: DC

## 2013-07-15 NOTE — Telephone Encounter (Signed)
Rx refill sent to patient pharmacy   

## 2013-07-22 ENCOUNTER — Other Ambulatory Visit: Payer: Self-pay | Admitting: *Deleted

## 2013-07-22 MED ORDER — LISINOPRIL-HYDROCHLOROTHIAZIDE 20-12.5 MG PO TABS: 1.0000 | ORAL_TABLET | Freq: Every morning | ORAL | Status: DC

## 2013-07-22 NOTE — Telephone Encounter (Signed)
Rx refill sent to patient pharmacy   

## 2013-07-24 LAB — COMPREHENSIVE METABOLIC PANEL
ALT: 18 U/L (ref 0–35)
AST: 26 U/L (ref 0–37)
Albumin: 4.2 g/dL (ref 3.5–5.2)
Alkaline Phosphatase: 66 U/L (ref 39–117)
BILIRUBIN TOTAL: 0.5 mg/dL (ref 0.2–1.2)
BUN: 23 mg/dL (ref 6–23)
CALCIUM: 10 mg/dL (ref 8.4–10.5)
CHLORIDE: 98 meq/L (ref 96–112)
CO2: 31 meq/L (ref 19–32)
CREATININE: 0.9 mg/dL (ref 0.50–1.10)
GLUCOSE: 89 mg/dL (ref 70–99)
Potassium: 4.2 mEq/L (ref 3.5–5.3)
SODIUM: 135 meq/L (ref 135–145)
TOTAL PROTEIN: 6.9 g/dL (ref 6.0–8.3)

## 2013-07-24 LAB — CBC
HCT: 37.5 % (ref 36.0–46.0)
HEMOGLOBIN: 12.5 g/dL (ref 12.0–15.0)
MCH: 30.3 pg (ref 26.0–34.0)
MCHC: 33.3 g/dL (ref 30.0–36.0)
MCV: 91 fL (ref 78.0–100.0)
PLATELETS: 249 10*3/uL (ref 150–400)
RBC: 4.12 MIL/uL (ref 3.87–5.11)
RDW: 14.7 % (ref 11.5–15.5)
WBC: 5.4 10*3/uL (ref 4.0–10.5)

## 2013-07-24 LAB — LIPID PANEL
CHOLESTEROL: 221 mg/dL — AB (ref 0–200)
HDL: 50 mg/dL (ref >39)
LDL Cholesterol: 154 mg/dL — ABNORMAL HIGH (ref 0–99)
Total CHOL/HDL Ratio: 4.4 Ratio
Triglycerides: 86 mg/dL (ref <150)
VLDL: 17 mg/dL (ref 0–40)

## 2013-07-24 LAB — MAGNESIUM: MAGNESIUM: 2.2 mg/dL (ref 1.5–2.5)

## 2013-07-24 LAB — TSH: TSH: 4.28 u[IU]/mL (ref 0.350–4.500)

## 2013-07-31 ENCOUNTER — Telehealth: Payer: Self-pay | Admitting: *Deleted

## 2013-07-31 NOTE — Telephone Encounter (Signed)
Message copied by Lauralee Evener on Thu Jul 31, 2013  2:19 PM ------      Message from: Shelva Majestic A      Created: Sun Jul 27, 2013  9:42 PM       Add atorvastatin 20 mg ------

## 2013-07-31 NOTE — Telephone Encounter (Signed)
Called patient with results and recommendations. She declined medication at this time. States that she has heard too many negative things about cholesterol medications. She wants to research some "natural remedies" at this time. She says that she understands the risks of having elevated cholesterol levels.

## 2013-08-19 ENCOUNTER — Ambulatory Visit: Payer: Medicare Other | Admitting: Cardiovascular Disease

## 2013-09-19 ENCOUNTER — Encounter: Payer: Self-pay | Admitting: Cardiovascular Disease

## 2013-10-27 ENCOUNTER — Telehealth: Payer: Self-pay | Admitting: *Deleted

## 2013-10-27 NOTE — Telephone Encounter (Signed)
Faxed CPAP supply order to [facility]. 

## 2013-11-13 ENCOUNTER — Other Ambulatory Visit: Payer: Self-pay | Admitting: Cardiovascular Disease

## 2013-12-02 ENCOUNTER — Other Ambulatory Visit: Payer: Self-pay | Admitting: *Deleted

## 2013-12-02 MED ORDER — HYDRALAZINE HCL 50 MG PO TABS: 50.0000 mg | ORAL_TABLET | Freq: Three times a day (TID) | ORAL | Status: DC

## 2013-12-12 ENCOUNTER — Other Ambulatory Visit: Payer: Self-pay

## 2013-12-12 MED ORDER — METOPROLOL TARTRATE 50 MG PO TABS: 75.0000 mg | ORAL_TABLET | Freq: Two times a day (BID) | ORAL | Status: DC

## 2013-12-12 NOTE — Telephone Encounter (Signed)
Rx was sent to pharmacy electronically. 

## 2013-12-26 ENCOUNTER — Other Ambulatory Visit: Payer: Self-pay | Admitting: Cardiovascular Disease

## 2013-12-26 NOTE — Telephone Encounter (Signed)
Rx was sent to pharmacy electronically. 

## 2014-01-05 ENCOUNTER — Telehealth: Payer: Self-pay | Admitting: Cardiovascular Disease

## 2014-01-05 NOTE — Telephone Encounter (Signed)
FORWARD TO [facility]

## 2014-01-05 NOTE — Telephone Encounter (Signed)
Spoke to patient  She states she had dentalwork-tooth extraction.she is unable to use bipap Informed patient message will be referred [facility] - Jennife rand [name] CMA Anderson Malta is present in the office - and aware

## 2014-01-05 NOTE — Telephone Encounter (Signed)
Pt called in stating that she would like for Dr. Claiborne Billings to send a request to [facility] stating that she needs her pressure on her ByPAP machine lowered because it is keeping her up. Please call her to update her on this process.  thanks

## 2014-01-05 NOTE — Telephone Encounter (Signed)
Anderson Malta from [facility] was here today and call was discussed with her by Trixie Dredge RN. Anderson Malta was given the information and said that she will contact the patient.

## 2014-02-03 ENCOUNTER — Telehealth: Payer: Self-pay | Admitting: Cardiovascular Disease

## 2014-02-04 NOTE — Telephone Encounter (Signed)
Close encounter 

## 2014-02-26 ENCOUNTER — Encounter: Payer: Self-pay | Admitting: Cardiology

## 2014-02-26 ENCOUNTER — Ambulatory Visit (INDEPENDENT_AMBULATORY_CARE_PROVIDER_SITE_OTHER): Payer: Medicare Other | Admitting: Cardiology

## 2014-02-26 VITALS — BP 142/88 | HR 58 | Ht 62.0 in | Wt 204.7 lb

## 2014-02-26 DIAGNOSIS — R002 Palpitations: Secondary | ICD-10-CM | POA: Diagnosis not present

## 2014-02-26 NOTE — Progress Notes (Signed)
02/26/2014 Brianna Reyes   [birth year]  [id number]  Primary Physician Horatio Pel, MD Primary Cardiologist: Dr. Claiborne Billings  HPI:  Brianna Reyes is a 80 y/o female, followed by Dr. Claiborne Billings, with  a history of hypertension, palpitations, hypothyroidism, hyperlipidemia, as well as obstructive sleep apnea compliant with CPAP theray. She has documented mild LVH with grade 2 diastolic dysfunction. She does a history of previous labile blood pressure at home a bit in the past. These have ranged from 211 to 941 systolically. Recently, her medications have been titrated and she now is taking metoprolol 75 mg twice a day, hydralazine, 50 mg 3 times a day, and she takes a total dose of lisinopril 40 mg and HCTZ 12.5 mg daily.   She presents to clinic today for evaluation. She complains of frequent palpitations. She notes experiencing an "irregular heart beat". She also feels like she has frequent pauses. She denies associated dizziness, syncope/ near syncope. She denies CP and dyspnea. She reports full medication compliance.   EKG today demonstrates SR with isolated PVCs. HR 62 bpm. BP is 142/88.  Current Outpatient Prescriptions  Medication Sig Dispense Refill  . ASTAXANTHIN PO Take 1 tablet by mouth daily.    . beta carotene w/minerals (OCUVITE) tablet Take 1 tablet by mouth daily.    Marland Kitchen co-enzyme Q-10 30 MG capsule Take 30 mg by mouth 3 (three) times daily.    . COLLAGEN PO Take 3 drops by mouth daily. Pt states adds 3 drops to water and drinks; been taking 1-2 months    . hydrALAZINE (APRESOLINE) 50 MG tablet Take 1 tablet (50 mg total) by mouth 3 (three) times daily. (Patient taking differently: Take 50 mg by mouth 2 (two) times daily. ) 270 tablet 1  . levothyroxine (SYNTHROID, LEVOTHROID) 88 MCG tablet Take 88 mcg by mouth daily before breakfast.    . lisinopril (PRINIVIL,ZESTRIL) 20 MG tablet Take 1 tablet (20 mg total) by mouth daily. 90 tablet 2  . lisinopril-hydrochlorothiazide  (PRINZIDE,ZESTORETIC) 20-12.5 MG per tablet Take 1 tablet by mouth every morning. 90 tablet 6  . LUTEIN-ZEAXANTHIN PO Take 1 tablet by mouth daily.    . metoprolol (LOPRESSOR) 50 MG tablet Take 1.5 tablets (75 mg total) by mouth 2 (two) times daily. 270 tablet 2  . Misc Natural Products (LUTEIN 20 PO) Take 1 capsule by mouth daily.    . Multiple Vitamin (MULTIVITAMIN WITH MINERALS) TABS tablet Take 1 tablet by mouth daily. Contains vitamin C    . NON FORMULARY CPAP THERAPY    . omega-3 acid ethyl esters (LOVAZA) 1 G capsule Take 1 g by mouth daily.      No current facility-administered medications for this visit.    No Known Allergies  History   Social History  . Marital Status: Single    Spouse Name: N/A    Number of Children: N/A  . Years of Education: N/A   Occupational History  . Not on file.   Social History Main Topics  . Smoking status: Never Smoker   . Smokeless tobacco: Never Used  . Alcohol Use: No  . Drug Use: No  . Sexual Activity: No   Other Topics Concern  . Not on file   Social History Narrative     Review of Systems: General: negative for chills, fever, night sweats or weight changes.  Cardiovascular: negative for chest pain, dyspnea on exertion, edema, orthopnea, palpitations, paroxysmal nocturnal dyspnea or shortness of breath Dermatological: negative for rash Respiratory: negative  for cough or wheezing Urologic: negative for hematuria Abdominal: negative for nausea, vomiting, diarrhea, bright red blood per rectum, melena, or hematemesis Neurologic: negative for visual changes, syncope, or dizziness All other systems reviewed and are otherwise negative except as noted above.    Blood pressure 142/88, pulse 58, height 5\' 2"  (1.575 m), weight 204 lb 11.2 oz (92.851 kg).  General appearance: alert, cooperative and no distress Neck: no carotid bruit and no JVD Lungs: clear to auscultation bilaterally Heart: regular rate and rhythm and with  PVCs Extremities: no LEE Pulses: 2+ and symmetric Skin: warm and dry Neurologic: Grossly normal  EKG SR with PVCs. 62 bpm  ASSESSMENT AND PLAN:   1. Palpitations: Pt endorses irregularity in rhythm, palpitations and occasional sensation of pauses. Her EKG demonstrates SR with PVCs with a HR of 62 bpm. I have recommended 2 week cardiac event monitoring to assess burden of PVCs and to rule out atrial fibrillation and sinus bradycardia. For now we will continue current dose of Metoprolol. Will not increase dose due to borderline bradycardia.   PLAN  2 week cardiac monitoring to assess for arrhthymias. F/u with Dr. Claiborne Billings in 3 weeks.   Brianna Reyes, BRITTAINYPA-C 02/26/2014 5:11 PM

## 2014-02-26 NOTE — Patient Instructions (Signed)
Please follow up with Dr. Claiborne Billings in 3 weeks.  NO CHANGES IN YOUR MEDICATIONS.  Your Doctor has ordered you to wear a heart monitor. You will wear this for 14 days.   TIPS -  REMINDERS 1. The sensor is the lanyard that is worn around your neck every day - this is powered by a battery that needs to be changed every day 2. The monitor is the device that allows you to record symptoms - this will need to be charged daily 3. The sensor & monitor need to be within 100 feet of each other at all times 4. The sensor connects to the electrodes (stickers) - these should be changed every 24-48 hours (you do not have to remove them when you bathe, just make sure they are dry when you connect it back to the sensor 5. If you need more supplies (electrodes, batteries), please call the 1-800 # on the back of the pamphlet and [employer] will mail you more supplies 6. If your skin becomes sensitive, please try the sample pack of sensitive skin electrodes (the white packet in your silver box) and call [employer] to have them mail you more of these type of electrodes 7. When you are finish wearing the monitor, please place all supplies back in the silver box, place the silver box in the pre-packaged UPS bag and drop off at [employer] or call them so they can come pick it up   Cardiac Event Monitoring A cardiac event monitor is a small recording device used to help detect abnormal heart rhythms (arrhythmias). The monitor is used to record heart rhythm when noticeable symptoms such as the following occur:  Fast heartbeats (palpitations), such as heart racing or fluttering.  Dizziness.  Fainting or light-headedness.  Unexplained weakness. The monitor is wired to two electrodes placed on your chest. Electrodes are flat, sticky disks that attach to your skin. The monitor can be worn for up to 30 days. You will wear the monitor at all times, except when bathing.  HOW TO USE YOUR CARDIAC EVENT MONITOR A technician will prepare  your chest for the electrode placement. The technician will show you how to place the electrodes, how to work the monitor, and how to replace the batteries. Take time to practice using the monitor before you leave the office. Make sure you understand how to send the information from the monitor to your health care provider. This requires a telephone with a landline, not a cell phone. You need to:  Wear your monitor at all times, except when you are in water:  Do not get the monitor wet.  Take the monitor off when bathing. Do not swim or use a hot tub with it on.  Keep your skin clean. Do not put body lotion or moisturizer on your chest.  Change the electrodes daily or any time they stop sticking to your skin. You might need to use tape to keep them on.  It is possible that your skin under the electrodes could become irritated. To keep this from happening, try to put the electrodes in slightly different places on your chest. However, they must remain in the area under your left breast and in the upper right section of your chest.  Make sure the monitor is safely clipped to your clothing or in a location close to your body that your health care provider recommends.  Press the button to record when you feel symptoms of heart trouble, such as dizziness, weakness, light-headedness, palpitations,  thumping, shortness of breath, unexplained weakness, or a fluttering or racing heart. The monitor is always on and records what happened slightly before you pressed the button, so do not worry about being too late to get good information.  Keep a diary of your activities, such as walking, doing chores, and taking medicine. It is especially important to note what you were doing when you pushed the button to record your symptoms. This will help your health care provider determine what might be contributing to your symptoms. The information stored in your monitor will be reviewed by your health care provider  alongside your diary entries.  Send the recorded information as recommended by your health care provider. It is important to understand that it will take some time for your health care provider to process the results.  Change the batteries as recommended by your health care provider. SEEK IMMEDIATE MEDICAL CARE IF:   You have chest pain.  You have extreme difficulty breathing or shortness of breath.  You develop a very fast heartbeat that persists.  You develop dizziness that does not go away.  You faint or constantly feel you are about to faint. Document Released: 12/07/2007 Document Revised: 07/14/2013 Document Reviewed: 08/26/2012 [facility] Patient Information 2015 [place], [place]. This information is not intended to replace advice given to you by your health care provider. Make sure you discuss any questions you have with your health care provider.

## 2014-03-19 ENCOUNTER — Encounter: Payer: Self-pay | Admitting: Cardiovascular Disease

## 2014-03-19 ENCOUNTER — Ambulatory Visit (INDEPENDENT_AMBULATORY_CARE_PROVIDER_SITE_OTHER): Payer: Medicare Other | Admitting: Cardiovascular Disease

## 2014-03-19 VITALS — BP 132/70 | HR 62 | Ht 64.0 in | Wt 205.6 lb

## 2014-03-19 DIAGNOSIS — E7849 Other hyperlipidemia: Secondary | ICD-10-CM

## 2014-03-19 DIAGNOSIS — R002 Palpitations: Secondary | ICD-10-CM | POA: Diagnosis not present

## 2014-03-19 DIAGNOSIS — I10 Essential (primary) hypertension: Secondary | ICD-10-CM

## 2014-03-19 DIAGNOSIS — G4733 Obstructive sleep apnea (adult) (pediatric): Secondary | ICD-10-CM | POA: Diagnosis not present

## 2014-03-19 DIAGNOSIS — E784 Other hyperlipidemia: Secondary | ICD-10-CM

## 2014-03-19 DIAGNOSIS — E038 Other specified hypothyroidism: Secondary | ICD-10-CM

## 2014-03-19 DIAGNOSIS — I359 Nonrheumatic aortic valve disorder, unspecified: Secondary | ICD-10-CM

## 2014-03-19 MED ORDER — METOPROLOL TARTRATE 50 MG PO TABS: ORAL_TABLET | ORAL | Status: DC

## 2014-03-19 NOTE — Patient Instructions (Signed)
Your physician has recommended you make the following change in your medication: increase the lopressor to 100 mg in the morning and 75 mg in the evening.  Your physician recommends that you return for lab work fasting.  Your physician has requested that you have an echocardiogram. Echocardiography is a painless test that uses sound waves to create images of your heart. It provides your doctor with information about the size and shape of your heart and how well your heart's chambers and valves are working. This procedure takes approximately one hour. There are no restrictions for this procedure.   Your physician recommends that you schedule a follow-up appointment in: 2-3 months.

## 2014-03-19 NOTE — Progress Notes (Signed)
Patient ID: ANAIH BRANDER, female   DOB: [birth year], 81 y.o.   MRN: [id number]     HPI: KLEA NALL is a 81 y.o. female who presents to the office today for 7 month cardiology followup evaluation.  Ms. Gruver has a history of hypertension, palpitations, hypothyroidism, hyperlipidemia, as well as complex sleep apnea. She has documented mild LVH with grade 2 diastolic dysfunction. She does a history of previous  labile blood pressure at home a bit in the past.  These have ranged from 937 to 169 systolically.  Recently, her medications have been titrated says she now is taking metoprolol 75 mg twice a day, hydralazine, 50 mg 3 times a day, and she takes a total dose of lisinopril 40 mg and HCTZ 12.5 mg daily.    She does have complex sleep apnea and has required BiPAP therapy or BiPAP pressure of 22 and an EPAP pressure of 17. She uses it 100% of the time.  She feels that sleep is sleeping much better.  She is also concerned that she may at times.  Still be stopping breathing.  She cannot recall when her last time her unit was downloaded.  She recently was seen in the office by Lesia Hausen on 02/26/2014 with complaints of increasing palpitations.  A [employer] monitor was performed.  I reviewed this, which recorded her rhythm from the summer 17 2015 through 03/11/2014.  She had sinus rhythm.  There were occasional unifocal isolated PVCs.  There were no episodes of significant bradycardia or tachycardia.  She denies recent chest pain.  She presents for evaluation.  Past Medical History  Diagnosis Date  . Brain tumor   . Hypothyroidism   . Sleep apnea 04/22/08    SLEEP STUDY done @ [facility]. Read by Dr. Keturah Barre. Repeat study done 05/22/11 @ [facility].  . Palpitations 10/24/11    cardionet monitor worn  . Meningioma   . HTN (hypertension) 10/24/11    ECHO; EF=> 55% tHERE IS MILD CONCENTRIC LV HYPERTROPHY. LV SYSTOLIC FUNCTION IS NORMAL. THE TRANSMITRAL SPECTRAL DOPPLER  FLOW PATTERN SUGGESTIVE OF PSEUDONORMALIATION.  Marland Kitchen Sleep apnea   . Arthritis     Past Surgical History  Procedure Laterality Date  . Appendectomy    . Tonsillectomy    . Tubal ligation      No Known Allergies  Current Outpatient Prescriptions  Medication Sig Dispense Refill  . ASTAXANTHIN PO Take 1 tablet by mouth daily.    . beta carotene w/minerals (OCUVITE) tablet Take 1 tablet by mouth daily.    Marland Kitchen co-enzyme Q-10 30 MG capsule Take 30 mg by mouth 3 (three) times daily.    . COLLAGEN PO Take 3 drops by mouth daily. Pt states adds 3 drops to water and drinks; been taking 1-2 months    . hydrALAZINE (APRESOLINE) 50 MG tablet Take 1 tablet (50 mg total) by mouth 3 (three) times daily. (Patient taking differently: Take 50 mg by mouth 2 (two) times daily. ) 270 tablet 1  . levothyroxine (SYNTHROID, LEVOTHROID) 88 MCG tablet Take 88 mcg by mouth daily before breakfast.    . lisinopril (PRINIVIL,ZESTRIL) 20 MG tablet Take 1 tablet (20 mg total) by mouth daily. 90 tablet 2  . lisinopril-hydrochlorothiazide (PRINZIDE,ZESTORETIC) 20-12.5 MG per tablet Take 1 tablet by mouth every morning. 90 tablet 6  . LUTEIN-ZEAXANTHIN PO Take 1 tablet by mouth daily.    . metoprolol (LOPRESSOR) 50 MG tablet Take 1.5 tablets (75 mg total)  by mouth 2 (two) times daily. 270 tablet 2  . Misc Natural Products (LUTEIN 20 PO) Take 1 capsule by mouth daily.    . Multiple Vitamin (MULTIVITAMIN WITH MINERALS) TABS tablet Take 1 tablet by mouth daily. Contains vitamin C    . NON FORMULARY CPAP THERAPY    . omega-3 acid ethyl esters (LOVAZA) 1 G capsule Take 1 g by mouth daily.      No current facility-administered medications for this visit.    History   Social History  . Marital Status: Single    Spouse Name: N/A    Number of Children: N/A  . Years of Education: N/A   Occupational History  . Not on file.   Social History Main Topics  . Smoking status: Never Smoker   . Smokeless tobacco: Never Used  .  Alcohol Use: No  . Drug Use: No  . Sexual Activity: No   Other Topics Concern  . Not on file   Social History Narrative    Family History  Problem Relation Age of Onset  . Heart disease Brother     ischemic  . Hypertension    . Stroke     ROS General: Negative; No fevers, chills, or night sweats;  HEENT: Negative; No changes in vision or hearing, sinus congestion, difficulty swallowing Pulmonary: Negative; No cough, wheezing, shortness of breath, hemoptysis Cardiovascular: see HPI GI: Negative; No nausea, vomiting, diarrhea, or abdominal pain GU: Negative; No dysuria, hematuria, or difficulty voiding Musculoskeletal: Negative; no myalgias, joint pain, or weakness Hematologic/Oncology: Positive for hypothyroidism; no easy bruising, bleeding Endocrine: Negative; no heat/cold intolerance Neuro: Negative; no changes in balance, headaches Skin: Negative; No rashes or skin lesions Psychiatric: Negative; No behavioral problems, depression Sleep: Positive for complex, sleep apnea, currently on BiPAP therapy.  She is unaware of any breakthrough  snoring, daytime sleepiness, hypersomnolence, bruxism, restless legs, hypnogognic hallucinations, no cataplexy Other comprehensive 14 point system review is negative    PE BP 132/70 mmHg  Pulse 62  Ht 5\' 4"  (1.626 m)  Wt 205 lb 9.6 oz (93.26 kg)  BMI 35.27 kg/m2  General: Alert, oriented, no distress.  Skin: normal turgor, no rashes HEENT: Normocephalic, atraumatic. Pupils round and reactive; sclera anicteric;no lid lag.  Nose without nasal septal hypertrophy Mouth/Parynx benign; Mallinpatti scale 3 Neck: No JVD, no carotid bruits with normal carotid upstroke Chest: No chest wall tenderness to palpation  Lungs: clear to ausculatation and percussion; no wheezing or rales Heart: RRR, s1 s2 normal  1/6 systolic murmur; no S3 or S4 gallop no diastolic murmur, rubs, thrills or heaves.  Abdomen: soft, nontender; no hepatosplenomehaly, BS+;  abdominal aorta nontender and not dilated by palpation. Back: No CVA tenderness  Pulses 2+ Extremities: no clubbing cyanosis or edema, Homan's sign negative  Neurologic: grossly nonfocal Psychologic: normal affect and mood.  ECG (independently read by me): Normal sinus rhythm at 62 bpm.  No ectopy.  QTc interval 414 ms.  May 2015 ECG (independently read by me): sinus bradycardia 57 beats per minute.  No ectopy.  Prior ECG : Sinus rhythm at 68 beats per minute. No ectopy per  LABS:  BMET    Component Value Date/Time   NA 135 07/24/2013 0843   K 4.2 07/24/2013 0843   CL 98 07/24/2013 0843   CO2 31 07/24/2013 0843   GLUCOSE 89 07/24/2013 0843   BUN 23 07/24/2013 0843   CREATININE 0.90 07/24/2013 0843   CREATININE 0.82 09/21/2012 0530   CALCIUM 10.0 07/24/2013  0843   GFRNONAA 66* 09/21/2012 0530   GFRAA 76* 09/21/2012 0530     Hepatic Function Panel     Component Value Date/Time   PROT 6.9 07/24/2013 0843   ALBUMIN 4.2 07/24/2013 0843   AST 26 07/24/2013 0843   ALT 18 07/24/2013 0843   ALKPHOS 66 07/24/2013 0843   BILITOT 0.5 07/24/2013 0843     CBC    Component Value Date/Time   WBC 5.4 07/24/2013 0843   RBC 4.12 07/24/2013 0843   HGB 12.5 07/24/2013 0843   HCT 37.5 07/24/2013 0843   PLT 249 07/24/2013 0843   MCV 91.0 07/24/2013 0843   MCH 30.3 07/24/2013 0843   MCHC 33.3 07/24/2013 0843   RDW 14.7 07/24/2013 0843   LYMPHSABS 0.6* 09/18/2012 2307   MONOABS 0.6 09/18/2012 2307   EOSABS 0.1 09/18/2012 2307   BASOSABS 0.0 09/18/2012 2307     BNP No results found for: PROBNP  Lipid Panel     Component Value Date/Time   CHOL 221* 07/24/2013 0843     RADIOLOGY: No results found.    ASSESSMENT AND PLAN: Ms. Brytni Dray is an 81 year old female with a history of labile hypertension,  history of palpitations, hyperlipidemia, obesity, and complex sleep apnea.  An echo Doppler study in 2013 showed normal systolic function with suggestive of grade 2  diastolic dysfunction. She had moderate LA dilatation, mitral annular calcification,aortic valve sclerosis with very mild stenosis.  Her blood pressure today is controlled.  Her current therapy consisting of lisinopril/HCTZ 20/12.5, hydralazine 50 mg 3 times a day, metoprolol 75 mg twice a day.  She continues to be symptomatic with her PVCs.  I reviewed her [place] monitor with her in detail.  I will attempt to slightly increase her beta blocker such that she will take metoprolol 100 mg in the morning and 75 mg in the evening.  She does have a 2/6 systolic murmur.  Her last echo Doppler study was in 2013.  I am scheduling her for follow-up echo Doppler study to reassess her systolic and diastolic function as well as aortic valve.  Laboratory will be checked consisting of a magnesium, cognitive metabolic panel, CBC, TSH, and lipid studies.  She is continuing to use her CPAP therapy for her complex obstructive sleep apnea. I will see her in the office in follow-up and further recommendations will be made at that time.  Time spent: 25 minutes  Troy Sine, MD, [facility]  03/19/2014 3:50 PM

## 2014-03-23 ENCOUNTER — Ambulatory Visit (HOSPITAL_COMMUNITY): Payer: Medicare Other

## 2014-03-25 ENCOUNTER — Telehealth: Payer: Self-pay | Admitting: Cardiovascular Disease

## 2014-03-25 NOTE — Telephone Encounter (Signed)
Returned call to patient she stated she never received a lab order.Advised she does not need a lab order for [facility] lab.Advised order was already placed in chart.Advised no appointment needed at [facility] to eat or drink after midnight,lab opens at 8:30 am.

## 2014-03-25 NOTE — Telephone Encounter (Signed)
Pt said Dr Claiborne Billings wanted her to have lab work,but he did not give her an order.Please call her and let her know,she wants to go Friday.

## 2014-03-26 DIAGNOSIS — E038 Other specified hypothyroidism: Secondary | ICD-10-CM | POA: Diagnosis not present

## 2014-03-26 DIAGNOSIS — E784 Other hyperlipidemia: Secondary | ICD-10-CM | POA: Diagnosis not present

## 2014-03-26 DIAGNOSIS — I10 Essential (primary) hypertension: Secondary | ICD-10-CM | POA: Diagnosis not present

## 2014-03-26 DIAGNOSIS — R002 Palpitations: Secondary | ICD-10-CM | POA: Diagnosis not present

## 2014-03-27 LAB — COMPREHENSIVE METABOLIC PANEL
ALK PHOS: 59 U/L (ref 39–117)
ALT: 14 U/L (ref 0–35)
AST: 21 U/L (ref 0–37)
Albumin: 3.8 g/dL (ref 3.5–5.2)
BUN: 22 mg/dL (ref 6–23)
CO2: 32 mEq/L (ref 19–32)
Calcium: 10 mg/dL (ref 8.4–10.5)
Chloride: 100 mEq/L (ref 96–112)
Creat: 0.83 mg/dL (ref 0.50–1.10)
Glucose, Bld: 84 mg/dL (ref 70–99)
Potassium: 4.1 mEq/L (ref 3.5–5.3)
SODIUM: 138 meq/L (ref 135–145)
TOTAL PROTEIN: 6.8 g/dL (ref 6.0–8.3)
Total Bilirubin: 0.6 mg/dL (ref 0.2–1.2)

## 2014-03-27 LAB — LIPID PANEL
CHOLESTEROL: 203 mg/dL — AB (ref 0–200)
HDL: 51 mg/dL (ref >39)
LDL Cholesterol: 133 mg/dL — ABNORMAL HIGH (ref 0–99)
Total CHOL/HDL Ratio: 4 Ratio
Triglycerides: 96 mg/dL (ref <150)
VLDL: 19 mg/dL (ref 0–40)

## 2014-03-27 LAB — CBC
HEMATOCRIT: 38.6 % (ref 36.0–46.0)
Hemoglobin: 12.5 g/dL (ref 12.0–15.0)
MCH: 29.7 pg (ref 26.0–34.0)
MCHC: 32.4 g/dL (ref 30.0–36.0)
MCV: 91.7 fL (ref 78.0–100.0)
MPV: 10 fL (ref 8.6–12.4)
Platelets: 237 10*3/uL (ref 150–400)
RBC: 4.21 MIL/uL (ref 3.87–5.11)
RDW: 14.3 % (ref 11.5–15.5)
WBC: 5.4 10*3/uL (ref 4.0–10.5)

## 2014-03-27 LAB — TSH: TSH: 5.064 u[IU]/mL — ABNORMAL HIGH (ref 0.350–4.500)

## 2014-03-27 LAB — MAGNESIUM: Magnesium: 2.2 mg/dL (ref 1.5–2.5)

## 2014-04-05 ENCOUNTER — Emergency Department (HOSPITAL_COMMUNITY)
Admission: EM | Admit: 2014-04-05 | Discharge: 2014-04-05 | Disposition: A | Discharge status: Home / Self Care | Payer: Medicare Other | Attending: Emergency Medicine | Admitting: Emergency Medicine

## 2014-04-05 ENCOUNTER — Emergency Department (HOSPITAL_COMMUNITY): Payer: Medicare Other

## 2014-04-05 ENCOUNTER — Telehealth: Payer: Self-pay | Admitting: Physician Assistant

## 2014-04-05 ENCOUNTER — Encounter (HOSPITAL_COMMUNITY): Payer: Self-pay | Admitting: *Deleted

## 2014-04-05 DIAGNOSIS — R002 Palpitations: Secondary | ICD-10-CM | POA: Diagnosis not present

## 2014-04-05 DIAGNOSIS — Z79899 Other long term (current) drug therapy: Secondary | ICD-10-CM | POA: Diagnosis not present

## 2014-04-05 DIAGNOSIS — R609 Edema, unspecified: Secondary | ICD-10-CM | POA: Diagnosis not present

## 2014-04-05 DIAGNOSIS — I10 Essential (primary) hypertension: Secondary | ICD-10-CM | POA: Insufficient documentation

## 2014-04-05 DIAGNOSIS — Z8669 Personal history of other diseases of the nervous system and sense organs: Secondary | ICD-10-CM | POA: Diagnosis not present

## 2014-04-05 DIAGNOSIS — Z86011 Personal history of benign neoplasm of the brain: Secondary | ICD-10-CM | POA: Diagnosis not present

## 2014-04-05 DIAGNOSIS — E039 Hypothyroidism, unspecified: Secondary | ICD-10-CM | POA: Insufficient documentation

## 2014-04-05 DIAGNOSIS — Z8739 Personal history of other diseases of the musculoskeletal system and connective tissue: Secondary | ICD-10-CM | POA: Insufficient documentation

## 2014-04-05 DIAGNOSIS — R0600 Dyspnea, unspecified: Secondary | ICD-10-CM

## 2014-04-05 DIAGNOSIS — R0602 Shortness of breath: Secondary | ICD-10-CM | POA: Diagnosis not present

## 2014-04-05 DIAGNOSIS — R079 Chest pain, unspecified: Secondary | ICD-10-CM | POA: Insufficient documentation

## 2014-04-05 DIAGNOSIS — R918 Other nonspecific abnormal finding of lung field: Secondary | ICD-10-CM | POA: Diagnosis not present

## 2014-04-05 LAB — URINALYSIS, ROUTINE W REFLEX MICROSCOPIC
Bilirubin Urine: NEGATIVE
Glucose, UA: NEGATIVE mg/dL
Hgb urine dipstick: NEGATIVE
KETONES UR: 15 mg/dL — AB
Nitrite: NEGATIVE
Protein, ur: NEGATIVE mg/dL
SPECIFIC GRAVITY, URINE: 1.008 (ref 1.005–1.030)
Urobilinogen, UA: 0.2 mg/dL (ref 0.0–1.0)
pH: 6 (ref 5.0–8.0)

## 2014-04-05 LAB — CBC WITH DIFFERENTIAL/PLATELET
Basophils Absolute: 0 10*3/uL (ref 0.0–0.1)
Basophils Relative: 0 % (ref 0–1)
Eosinophils Absolute: 0.2 10*3/uL (ref 0.0–0.7)
Eosinophils Relative: 2 % (ref 0–5)
HCT: 40.4 % (ref 36.0–46.0)
HEMOGLOBIN: 13.5 g/dL (ref 12.0–15.0)
Lymphocytes Relative: 24 % (ref 12–46)
Lymphs Abs: 1.6 10*3/uL (ref 0.7–4.0)
MCH: 30.4 pg (ref 26.0–34.0)
MCHC: 33.4 g/dL (ref 30.0–36.0)
MCV: 91 fL (ref 78.0–100.0)
MONO ABS: 0.8 10*3/uL (ref 0.1–1.0)
MONOS PCT: 12 % (ref 3–12)
Neutro Abs: 4.3 10*3/uL (ref 1.7–7.7)
Neutrophils Relative %: 62 % (ref 43–77)
Platelets: 243 10*3/uL (ref 150–400)
RBC: 4.44 MIL/uL (ref 3.87–5.11)
RDW: 14.7 % (ref 11.5–15.5)
WBC: 6.9 10*3/uL (ref 4.0–10.5)

## 2014-04-05 LAB — I-STAT TROPONIN, ED: Troponin i, poc: 0 ng/mL (ref 0.00–0.08)

## 2014-04-05 LAB — BASIC METABOLIC PANEL
ANION GAP: 12 (ref 5–15)
BUN: 17 mg/dL (ref 6–23)
CALCIUM: 10 mg/dL (ref 8.4–10.5)
CO2: 26 mmol/L (ref 19–32)
Chloride: 101 mmol/L (ref 96–112)
Creatinine, Ser: 0.81 mg/dL (ref 0.50–1.10)
GFR calc Af Amer: 76 mL/min — ABNORMAL LOW (ref >90)
GFR calc non Af Amer: 66 mL/min — ABNORMAL LOW (ref >90)
Glucose, Bld: 107 mg/dL — ABNORMAL HIGH (ref 70–99)
Potassium: 3.6 mmol/L (ref 3.5–5.1)
Sodium: 139 mmol/L (ref 135–145)

## 2014-04-05 LAB — URINE MICROSCOPIC-ADD ON

## 2014-04-05 LAB — TROPONIN I: Troponin I: <0.03 ng/mL (ref <0.031)

## 2014-04-05 MED ORDER — GI COCKTAIL ~~LOC~~
30.0000 mL | Freq: Once | ORAL | Status: AC
  Administered 2014-04-05: 30 mL via ORAL
  Filled 2014-04-05: qty 30

## 2014-04-05 NOTE — Discharge Instructions (Signed)
Hypertension °Hypertension, commonly called high blood pressure, is when the force of blood pumping through your arteries is too strong. Your arteries are the blood vessels that carry blood from your heart throughout your body. A blood pressure reading consists of a higher number over a lower number, such as 110/72. The higher number (systolic) is the pressure inside your arteries when your heart pumps. The lower number (diastolic) is the pressure inside your arteries when your heart relaxes. Ideally you want your blood pressure below 120/80. °Hypertension forces your heart to work harder to pump blood. Your arteries may become narrow or stiff. Having hypertension puts you at risk for heart disease, stroke, and other problems.  °RISK FACTORS °Some risk factors for high blood pressure are controllable. Others are not.  °Risk factors you cannot control include:  °· Race. You may be at higher risk if you are African American. °· Age. Risk increases with age. °· Gender. Men are at higher risk than women before age 45 years. After age 65, women are at higher risk than men. °Risk factors you can control include: °· Not getting enough exercise or physical activity. °· Being overweight. °· Getting too much fat, sugar, calories, or salt in your diet. °· Drinking too much alcohol. °SIGNS AND SYMPTOMS °Hypertension does not usually cause signs or symptoms. Extremely high blood pressure (hypertensive crisis) may cause headache, anxiety, shortness of breath, and nosebleed. °DIAGNOSIS  °To check if you have hypertension, your health care provider will measure your blood pressure while you are seated, with your arm held at the level of your heart. It should be measured at least twice using the same arm. Certain conditions can cause a difference in blood pressure between your right and left arms. A blood pressure reading that is higher than normal on one occasion does not mean that you need treatment. If one blood pressure reading  is high, ask your health care provider about having it checked again. °TREATMENT  °Treating high blood pressure includes making lifestyle changes and possibly taking medicine. Living a healthy lifestyle can help lower high blood pressure. You may need to change some of your habits. °Lifestyle changes may include: °· Following the DASH diet. This diet is high in fruits, vegetables, and whole grains. It is low in salt, red meat, and added sugars. °· Getting at least 2½ hours of brisk physical activity every week. °· Losing weight if necessary. °· Not smoking. °· Limiting alcoholic beverages. °· Learning ways to reduce stress. ° If lifestyle changes are not enough to get your blood pressure under control, your health care provider may prescribe medicine. You may need to take more than one. Work closely with your health care provider to understand the risks and benefits. °HOME CARE INSTRUCTIONS °· Have your blood pressure rechecked as directed by your health care provider.   °· Take medicines only as directed by your health care provider. Follow the directions carefully. Blood pressure medicines must be taken as prescribed. The medicine does not work as well when you skip doses. Skipping doses also puts you at risk for problems.   °· Do not smoke.   °· Monitor your blood pressure at home as directed by your health care provider.  °SEEK MEDICAL CARE IF:  °· You think you are having a reaction to medicines taken. °· You have recurrent headaches or feel dizzy. °· You have swelling in your ankles. °· You have trouble with your vision. °SEEK IMMEDIATE MEDICAL CARE IF: °· You develop a severe headache or confusion. °·   You have unusual weakness, numbness, or feel faint.  You have severe chest or abdominal pain.  You vomit repeatedly.  You have trouble breathing. MAKE SURE YOU:   Understand these instructions.  Will watch your condition.  Will get help right away if you are not doing well or get worse. Document  Released: 02/27/2005 Document Revised: 07/14/2013 Document Reviewed: 12/20/2012 [facility] Patient Information 2015 [place], [place]. This information is not intended to replace advice given to you by your health care provider. Make sure you discuss any questions you have with your health care provider. Sleep Apnea  Sleep apnea is a sleep disorder characterized by abnormal pauses in breathing while you sleep. When your breathing pauses, the level of oxygen in your blood decreases. This causes you to move out of deep sleep and into light sleep. As a result, your quality of sleep is poor, and the system that carries your blood throughout your body (cardiovascular system) experiences stress. If sleep apnea remains untreated, the following conditions can develop:  High blood pressure (hypertension).  Coronary artery disease.  Inability to achieve or maintain an erection (impotence).  Impairment of your thought process (cognitive dysfunction). There are three types of sleep apnea:  Obstructive sleep apnea--Pauses in breathing during sleep because of a blocked airway.  Central sleep apnea--Pauses in breathing during sleep because the area of the brain that controls your breathing does not send the correct signals to the muscles that control breathing.  Mixed sleep apnea--A combination of both obstructive and central sleep apnea. RISK FACTORS The following risk factors can increase your risk of developing sleep apnea:  Being overweight.  Smoking.  Having narrow passages in your nose and throat.  Being of older age.  Being female.  Alcohol use.  Sedative and tranquilizer use.  Ethnicity. Among individuals younger than 35 years, African Americans are at increased risk of sleep apnea. SYMPTOMS   Difficulty staying asleep.  Daytime sleepiness and fatigue.  Loss of energy.  Irritability.  Loud, heavy snoring.  Morning headaches.  Trouble concentrating.  Forgetfulness.  Decreased  interest in sex. DIAGNOSIS  In order to diagnose sleep apnea, your caregiver will perform a physical examination. Your caregiver may suggest that you take a home sleep test. Your caregiver may also recommend that you spend the night in a sleep lab. In the sleep lab, several monitors record information about your heart, lungs, and brain while you sleep. Your leg and arm movements and blood oxygen level are also recorded. TREATMENT The following actions may help to resolve mild sleep apnea:  Sleeping on your side.   Using a decongestant if you have nasal congestion.   Avoiding the use of depressants, including alcohol, sedatives, and narcotics.   Losing weight and modifying your diet if you are overweight. There also are devices and treatments to help open your airway:  Oral appliances. These are custom-made mouthpieces that shift your lower jaw forward and slightly open your bite. This opens your airway.  Devices that create positive airway pressure. This positive pressure "splints" your airway open to help you breathe better during sleep. The following devices create positive airway pressure:  Continuous positive airway pressure (CPAP) device. The CPAP device creates a continuous level of air pressure with an air pump. The air is delivered to your airway through a mask while you sleep. This continuous pressure keeps your airway open.  Nasal expiratory positive airway pressure (EPAP) device. The EPAP device creates positive air pressure as you exhale. The device consists  of single-use valves, which are inserted into each nostril and held in place by adhesive. The valves create very little resistance when you inhale but create much more resistance when you exhale. That increased resistance creates the positive airway pressure. This positive pressure while you exhale keeps your airway open, making it easier to breath when you inhale again.  Bilevel positive airway pressure (BPAP) device. The  BPAP device is used mainly in patients with central sleep apnea. This device is similar to the CPAP device because it also uses an air pump to deliver continuous air pressure through a mask. However, with the BPAP machine, the pressure is set at two different levels. The pressure when you exhale is lower than the pressure when you inhale.  Surgery. Typically, surgery is only done if you cannot comply with less invasive treatments or if the less invasive treatments do not improve your condition. Surgery involves removing excess tissue in your airway to create a wider passage way. Document Released: 02/17/2002 Document Revised: 06/24/2012 Document Reviewed: 07/06/2011 [facility] Patient Information 2015 [place], [place]. This information is not intended to replace advice given to you by your health care provider. Make sure you discuss any questions you have with your health care provider.

## 2014-04-05 NOTE — ED Provider Notes (Signed)
CSN: [id number]     Arrival date & time 04/05/14  1604 History   First MD Initiated Contact with Patient 04/05/14 1826     Chief Complaint  Patient presents with  . Hypertension     (Consider location/radiation/quality/duration/timing/severity/associated sxs/prior Treatment) Patient is a 81 y.o. female presenting with shortness of breath.  Shortness of Breath Severity:  Moderate Onset quality:  Gradual Duration:  4 weeks Timing:  Constant Progression:  Worsening Chronicity:  New Context comment:  Ho sleep apnea and palpitations with recent cardiology visit showing PVCs Relieved by:  Nothing Worsened by:  Nothing tried Ineffective treatments:  None tried Associated symptoms: chest pain (palpitations and discomfort)   Associated symptoms: no abdominal pain, no cough and no fever     Past Medical History  Diagnosis Date  . Brain tumor   . Hypothyroidism   . Sleep apnea 04/22/08    SLEEP STUDY done @ [facility]. Read by Dr. Keturah Barre. Repeat study done 05/22/11 @ [facility].  . Palpitations 10/24/11    cardionet monitor worn  . Meningioma   . HTN (hypertension) 10/24/11    ECHO; EF=> 55% tHERE IS MILD CONCENTRIC LV HYPERTROPHY. LV SYSTOLIC FUNCTION IS NORMAL. THE TRANSMITRAL SPECTRAL DOPPLER FLOW PATTERN SUGGESTIVE OF PSEUDONORMALIATION.  Marland Kitchen Sleep apnea   . Arthritis    Past Surgical History  Procedure Laterality Date  . Appendectomy    . Tonsillectomy    . Tubal ligation     Family History  Problem Relation Age of Onset  . Heart disease Brother     ischemic  . Hypertension    . Stroke     History  Substance Use Topics  . Smoking status: Never Smoker   . Smokeless tobacco: Never Used  . Alcohol Use: No   OB History    No data available     Review of Systems  Constitutional: Negative for fever.  Respiratory: Positive for shortness of breath. Negative for cough.   Cardiovascular: Positive for chest pain (palpitations and discomfort).   Gastrointestinal: Negative for abdominal pain.  All other systems reviewed and are negative.     Allergies  Review of patient's allergies indicates no known allergies.  Home Medications   Prior to Admission medications   Medication Sig Start Date End Date Taking? Authorizing Provider  ASTAXANTHIN PO Take 1 tablet by mouth daily.    Historical Provider, MD  beta carotene w/minerals (OCUVITE) tablet Take 1 tablet by mouth daily.    Historical Provider, MD  co-enzyme Q-10 30 MG capsule Take 30 mg by mouth 3 (three) times daily.    Historical Provider, MD  COLLAGEN PO Take 3 drops by mouth daily. Pt states adds 3 drops to water and drinks; been taking 1-2 months    Historical Provider, MD  hydrALAZINE (APRESOLINE) 50 MG tablet Take 1 tablet (50 mg total) by mouth 3 (three) times daily. Patient taking differently: Take 50 mg by mouth 2 (two) times daily.  12/02/13   Troy Sine, MD  levothyroxine (SYNTHROID, LEVOTHROID) 88 MCG tablet Take 88 mcg by mouth daily before breakfast.    Historical Provider, MD  lisinopril (PRINIVIL,ZESTRIL) 20 MG tablet Take 1 tablet (20 mg total) by mouth daily. 07/15/13   Troy Sine, MD  lisinopril-hydrochlorothiazide (PRINZIDE,ZESTORETIC) 20-12.5 MG per tablet Take 1 tablet by mouth every morning. 07/22/13   Troy Sine, MD  LUTEIN-ZEAXANTHIN PO Take 1 tablet by mouth daily.    Historical Provider, MD  metoprolol (LOPRESSOR) 50 MG tablet Take 2 tablets in the morning and 1.5 in the evening 03/19/14   Troy Sine, MD  Misc Natural Products (LUTEIN 20 PO) Take 1 capsule by mouth daily.    Historical Provider, MD  Multiple Vitamin (MULTIVITAMIN WITH MINERALS) TABS tablet Take 1 tablet by mouth daily. Contains vitamin C    Historical Provider, MD  NON FORMULARY CPAP THERAPY    Historical Provider, MD  omega-3 acid ethyl esters (LOVAZA) 1 G capsule Take 1 g by mouth daily.     Historical Provider, MD   BP 140/64 mmHg  Pulse 66  Temp(Src) 98 F (36.7 C)  (Oral)  Resp 16  Ht 5\' 2"  (1.575 m)  Wt 204 lb (92.534 kg)  BMI 37.30 kg/m2  SpO2 95% Physical Exam  Constitutional: She is oriented to person, place, and time. She appears well-developed and well-nourished.  HENT:  Head: Normocephalic and atraumatic.  Right Ear: External ear normal.  Left Ear: External ear normal.  Eyes: Conjunctivae and EOM are normal. Pupils are equal, round, and reactive to light.  Neck: Normal range of motion. Neck supple.  Cardiovascular: Normal rate, regular rhythm, normal heart sounds and intact distal pulses.   Pulmonary/Chest: Effort normal and breath sounds normal.  Abdominal: Soft. Bowel sounds are normal. There is no tenderness.  Musculoskeletal: Normal range of motion.       Right lower leg: She exhibits edema. She exhibits no swelling.       Left lower leg: She exhibits no swelling and no edema.  Neurological: She is alert and oriented to person, place, and time.  Skin: Skin is warm and dry.  Vitals reviewed.   ED Course  Procedures (including critical care time) Labs Review Labs Reviewed  BASIC METABOLIC PANEL - Abnormal; Notable for the following:    Glucose, Bld 107 (*)    GFR calc non Af Amer 66 (*)    GFR calc Af Amer 76 (*)    All other components within normal limits  URINALYSIS, ROUTINE W REFLEX MICROSCOPIC - Abnormal; Notable for the following:    Ketones, ur 15 (*)    Leukocytes, UA MODERATE (*)    All other components within normal limits  URINE MICROSCOPIC-ADD ON - Abnormal; Notable for the following:    Squamous Epithelial / LPF FEW (*)    All other components within normal limits  CBC WITH DIFFERENTIAL/PLATELET  TROPONIN I  Randolm Idol, ED    Imaging Review Dg Chest 2 View  04/05/2014   CLINICAL DATA:  Acute onset of apnea. Hypertension. Initial encounter.  EXAM: CHEST  2 VIEW  COMPARISON:  Chest radiograph performed 06/13/2012  FINDINGS: The lungs are well-aerated. Mild bibasilar opacities likely reflect atelectasis.  Mild chronic interstitial prominence is seen. There is no evidence of pleural effusion or pneumothorax.  The heart is normal in size; the mediastinal contour is within normal limits. No acute osseous abnormalities are seen.  IMPRESSION: Mild bibasilar opacities likely reflect atelectasis. Mild chronic interstitial prominence seen.   Electronically Signed   By: Garald Balding M.D.   On: 04/05/2014 20:28     EKG Interpretation None      MDM   Final diagnoses:  Dyspnea  Essential hypertension    81 y.o. female with pertinent PMH of central sleep apnea presents with palpitations and dyspnea primarily occurring at night.  Pt is extremely preoccupied about the number of her BP measurements.  On arrival today, pt asymptomatic, with vitals and physical  exam as above.  BP improved (pt took her home medicines without informing staff).  Wu unremarkable, no new symptoms in 6 hours.  I discussed the pt's wu, and specifically addressed her leg swelling, however pt states it has been present for years and declines [place].  She will fu with her PCP for the same.  DC home in stable condition with strict return precautions.    I have reviewed all laboratory and imaging studies if ordered as above  1. Essential hypertension   2. Dyspnea         Debby Freiberg, MD 04/06/14 [phone]

## 2014-04-05 NOTE — ED Notes (Signed)
Pt c/o high blood pressure all night last night. Pt states every time she put on her sleep apnea machine, she wakes up in the middle of the night, checks her BP. Reports a SBP >220. Pt reports a mild HA. Denies seeing spots, blurry vision, n/v. Speech is clear, equal grips and strengthen in all extremities.

## 2014-04-05 NOTE — ED Notes (Signed)
Pt a/o x 4 on d/c with family in wheelchair.

## 2014-04-05 NOTE — Telephone Encounter (Signed)
    Call because of a malfunctioning CPAP and high BPS. She thinks the CPAP is causing her BP to rise and pumping air into her stomach. I advised her to not wear tonight and contact provider tomorrow AM to get a new machine. I also advised her to take an extra hydralazine. She will call us back if her BP does not normalize.   Angelena Form PA-C  [facility]

## 2014-04-08 ENCOUNTER — Telehealth: Payer: Self-pay | Admitting: Cardiovascular Disease

## 2014-04-08 NOTE — Telephone Encounter (Signed)
Brianna Reyes called wanting to have a vascular study done on her legs due to swelling she is having.  She also wants to know when the sleep study will be scheduled that [name] was supposed to do.  Please call her at home with this information.

## 2014-04-10 ENCOUNTER — Telehealth (HOSPITAL_COMMUNITY): Payer: Self-pay | Admitting: *Deleted

## 2014-04-10 NOTE — Telephone Encounter (Signed)
Pt was in the emergency room and the physician stated that her legs could possibly have a blood clot. She would like to know if she could have a doppler of her legs done on Monday when she comes in for her echo.. Please call

## 2014-04-10 NOTE — Telephone Encounter (Signed)
She declined ultrasound in the hospital - appears her swelling is chronic. Would defer until she sees Dr. Claiborne Billings back.  Dr. Lemmie Evens

## 2014-04-10 NOTE — Telephone Encounter (Signed)
Re: doppler Pt understands Dr. Lysbeth Penner recommendations. She states did not want the doppler done unless necessary. Restates chronic nature of leg problem.  Re: sleep study During conversation patient brought attention to wanting sleep study done so she could get new CPAP, was wondering if she could be scheduled for this.  Would like [name] to call her to discuss this.

## 2014-04-10 NOTE — Telephone Encounter (Signed)
Pt of Dr. Evette Georges:  ED eval for dyspnea 1 week ago,  D/ced home. Right leg edema apparently acknowledged by patient as ongoing.  She called this morning requesting doppler of legs, apparently discussed this with ED physician, concern for blood clot.  OK to order? She is scheduled for echo on Monday.

## 2014-04-11 ENCOUNTER — Other Ambulatory Visit: Payer: Self-pay | Admitting: Cardiovascular Disease

## 2014-04-13 ENCOUNTER — Ambulatory Visit (HOSPITAL_COMMUNITY)
Admission: RE | Admit: 2014-04-13 | Discharge: 2014-04-13 | Disposition: A | Discharge status: Home / Self Care | Payer: Medicare Other | Source: Ambulatory Visit | Attending: Cardiovascular Disease | Admitting: Cardiovascular Disease

## 2014-04-13 DIAGNOSIS — R002 Palpitations: Secondary | ICD-10-CM | POA: Diagnosis not present

## 2014-04-13 DIAGNOSIS — I10 Essential (primary) hypertension: Secondary | ICD-10-CM | POA: Insufficient documentation

## 2014-04-13 NOTE — Progress Notes (Signed)
2D Echo Performed 04/13/2014    Marygrace Drought, RCS

## 2014-04-13 NOTE — Telephone Encounter (Signed)
Rx refill sent to patient pharmacy   

## 2014-04-14 NOTE — Telephone Encounter (Signed)
Ok to schedule for sleep study

## 2014-04-21 ENCOUNTER — Encounter: Payer: Self-pay | Admitting: *Deleted

## 2014-04-24 NOTE — Telephone Encounter (Signed)
Questioned Dr. Claiborne Billings as to the need of the sleep study. He does not know. Will discuss at upcoming appointment before ordering.

## 2014-04-24 NOTE — Telephone Encounter (Signed)
I do not know anything about a sleep study being scheduled. Questioned Dr. Claiborne Billings. He okayed this but did not know why she needed this. He will discuss at her upcoming office visit. We will also contact Anderson Malta at [facility] to see whether she is aware of any reason the patient is needing a sleep study. The study will not be ordered until a reason for need is determined.

## 2014-05-08 ENCOUNTER — Encounter: Payer: Self-pay | Admitting: Cardiovascular Disease

## 2014-05-15 ENCOUNTER — Other Ambulatory Visit: Payer: Self-pay | Admitting: *Deleted

## 2014-05-15 DIAGNOSIS — G4731 Primary central sleep apnea: Secondary | ICD-10-CM

## 2014-05-22 ENCOUNTER — Ambulatory Visit: Payer: Medicare Other | Admitting: Cardiovascular Disease

## 2014-05-26 ENCOUNTER — Other Ambulatory Visit: Payer: Self-pay | Admitting: Cardiovascular Disease

## 2014-05-26 NOTE — Telephone Encounter (Signed)
Rx(s) sent to pharmacy electronically.  

## 2014-06-08 ENCOUNTER — Encounter (HOSPITAL_BASED_OUTPATIENT_CLINIC_OR_DEPARTMENT_OTHER): Payer: Medicare Other

## 2014-06-08 ENCOUNTER — Telehealth: Payer: Self-pay | Admitting: Cardiovascular Disease

## 2014-06-08 NOTE — Telephone Encounter (Signed)
Patient notified that order for download will be faxed to [facility] @ [phone]. She voiced understanding

## 2014-06-08 NOTE — Telephone Encounter (Signed)
Patient states she her heart feels like it is quivering and acts up. She states she quits breathing even with BiPap on. She states she was told by Dr. Claiborne Billings that there is a machine that is better for her heart - would like to be explained why a new machine would be better than just adjusting her current machine. She wants to know if her BiPap can be adjusted without having a titration study.   She states she has a BiPap and she uses it regularly.   Will defer to Dr. Claiborne Billings & [name] to review and advise patient

## 2014-06-08 NOTE — Telephone Encounter (Signed)
Please call,she wants to know why she needs a new machine.. Could not the one she have be adjusted? She is scheduled for a sleep study tonight, she does not want to have if. Please call asap.,need to cancel this.

## 2014-06-08 NOTE — Telephone Encounter (Signed)
Obtain a download on her current machine and settings to see if adjustment are necessary

## 2014-06-10 DIAGNOSIS — G4733 Obstructive sleep apnea (adult) (pediatric): Secondary | ICD-10-CM | POA: Diagnosis not present

## 2014-06-23 ENCOUNTER — Encounter: Payer: Self-pay | Admitting: Cardiovascular Disease

## 2014-06-25 ENCOUNTER — Ambulatory Visit (INDEPENDENT_AMBULATORY_CARE_PROVIDER_SITE_OTHER): Payer: Medicare Other | Admitting: Cardiovascular Disease

## 2014-06-25 ENCOUNTER — Encounter: Payer: Self-pay | Admitting: Cardiovascular Disease

## 2014-06-25 VITALS — BP 122/70 | HR 51 | Ht 62.0 in | Wt 204.8 lb

## 2014-06-25 DIAGNOSIS — G4731 Primary central sleep apnea: Secondary | ICD-10-CM

## 2014-06-25 DIAGNOSIS — G4737 Central sleep apnea in conditions classified elsewhere: Secondary | ICD-10-CM

## 2014-06-25 DIAGNOSIS — G4733 Obstructive sleep apnea (adult) (pediatric): Secondary | ICD-10-CM

## 2014-06-25 DIAGNOSIS — I359 Nonrheumatic aortic valve disorder, unspecified: Secondary | ICD-10-CM

## 2014-06-25 DIAGNOSIS — E039 Hypothyroidism, unspecified: Secondary | ICD-10-CM

## 2014-06-25 DIAGNOSIS — I10 Essential (primary) hypertension: Secondary | ICD-10-CM

## 2014-06-25 DIAGNOSIS — R002 Palpitations: Secondary | ICD-10-CM

## 2014-06-25 NOTE — Patient Instructions (Signed)
Your physician wants you to follow-up in: 6 months or sooner if needed. You will receive a reminder letter in the mail two months in advance. If you don't receive a letter, please call our office to schedule the follow-up appointment. 

## 2014-06-27 ENCOUNTER — Encounter: Payer: Self-pay | Admitting: Cardiovascular Disease

## 2014-06-27 DIAGNOSIS — G4731 Primary central sleep apnea: Secondary | ICD-10-CM | POA: Insufficient documentation

## 2014-06-27 NOTE — Progress Notes (Signed)
Patient ID: Brianna Reyes, female   DOB: March 20, 1930, 81 y.o.   MRN: [id number]     HPI: Brianna Reyes is a 81 y.o. female who presents to the office today for 4 month cardiology/sleep followup evaluation.  Brianna Reyes has a history of hypertension, palpitations, hypothyroidism, hyperlipidemia, as well as complex sleep apnea. She has documented mild LVH with grade 2 diastolic dysfunction. She does a history of previous  labile blood pressure at home a bit in the past.  These have ranged from 945 to 038 systolically.  Recently, her medications have been titrated says she now is taking metoprolol 75 mg twice a day, hydralazine, 50 mg 3 times a day, and she takes a total dose of lisinopril 40 mg and HCTZ 12.5 mg daily.    She was seen in the office by Lesia Hausen on 02/26/2014 with complaints of increasing palpitations.  A CardioNet monitor was performed.  I reviewed this, which recorded her rhythm from the summer 17 2015 through 03/11/2014.  She had sinus rhythm.  There were occasional unifocal isolated PVCs.  There were no episodes of significant bradycardia or tachycardia.  She denies recent chest pain.  When I last saw her in January 2016, I increased her beta blocker therapy.  2.  Metoprolol 100 mg in the morning and 75 mg in the evening.  This has resulted in significant improvement in her palpitations.  In light of her cardiac murmur.  I recommended a three-year follow-up echo Doppler study.  This was done on 04/13/2014 and showed normal systolic function with an EF of 60-65%.  Aortic valve was mildly thickened but there was no stenosis.  She had trivial aortic and mitral insufficiency.  Laboratory in January revealed a normal hemoglobin and hematocrit.  She has normal renal function.  She has complex sleep apnea and has required BiPAP therapy or BiPAP pressure of 22 and an EPAP pressure of 17. She uses it 100% of the time.  She feels that sleep is sleeping much better.  She is also concerned that she  may at times.  Still be stopping breathing.  When I saw her in January.  She could not recall recall when her last time her unit was downloaded.  A download was obtained from March 9 through 06/18/2014.  This showed 100% compliance.  She was sleeping 9 hours and 27 minutes at night.  Her average device IPAP pressure greater than 90% of the time was 18.9 with a maximum titrated IPAP pressure of 23.7.  Her maximum titrated EPAP pressure was 21.1.  Her average device CPAP pressure was 16.8.  AHI was still increased at 16.1.  Of note, her EPAP starting pressure was 10.  She is continuing to have obstructive apneas, occasional hypotony is, and has been demonstrated to have central apneas.  We had recommended an adaptive servo ventilation titration but she did not pursue this and initially wanted adjustment to her current BiPAP unit.   Past Medical History  Diagnosis Date  . Brain tumor   . Hypothyroidism   . Sleep apnea 04/22/08    SLEEP STUDY done @ [facility]. Read by Dr. Keturah Barre. Repeat study done 05/22/11 @ [facility].  . Palpitations 10/24/11    cardionet monitor worn  . Meningioma   . HTN (hypertension) 10/24/11    ECHO; EF=> 55% tHERE IS MILD CONCENTRIC LV HYPERTROPHY. LV SYSTOLIC FUNCTION IS NORMAL. THE TRANSMITRAL SPECTRAL DOPPLER FLOW PATTERN SUGGESTIVE OF PSEUDONORMALIATION.  Marland Kitchen  Sleep apnea   . Arthritis     Past Surgical History  Procedure Laterality Date  . Appendectomy    . Tonsillectomy    . Tubal ligation      No Known Allergies  Current Outpatient Prescriptions  Medication Sig Dispense Refill  . ASTAXANTHIN PO Take 1 tablet by mouth daily.    . beta carotene w/minerals (OCUVITE) tablet Take 1 tablet by mouth daily.    Marland Kitchen co-enzyme Q-10 30 MG capsule Take 30 mg by mouth 3 (three) times daily.    . hydrALAZINE (APRESOLINE) 50 MG tablet TAKE 1 TABLET BY MOUTH 3 TIMES A DAY 270 tablet 9  . levothyroxine (SYNTHROID, LEVOTHROID) 88 MCG tablet Take 88 mcg by  mouth daily before breakfast.    . lisinopril (PRINIVIL,ZESTRIL) 20 MG tablet TAKE 1 TABLET (20 MG TOTAL) BY MOUTH DAILY. 90 tablet 2  . lisinopril-hydrochlorothiazide (PRINZIDE,ZESTORETIC) 20-12.5 MG per tablet Take 1 tablet by mouth every morning. 90 tablet 6  . LUTEIN-ZEAXANTHIN PO Take 1 tablet by mouth daily.    . metoprolol (LOPRESSOR) 50 MG tablet Take 2 tablets in the morning and 1.5 in the evening 270 tablet 2  . Misc Natural Products (LUTEIN 20 PO) Take 1 capsule by mouth daily.    . Multiple Vitamin (MULTIVITAMIN WITH MINERALS) TABS tablet Take 1 tablet by mouth daily. Contains vitamin C    . NON FORMULARY CPAP THERAPY    . omega-3 acid ethyl esters (LOVAZA) 1 G capsule Take 1 g by mouth daily.      No current facility-administered medications for this visit.    History   Social History  . Marital Status: Single    Spouse Name: N/A  . Number of Children: N/A  . Years of Education: N/A   Occupational History  . Not on file.   Social History Main Topics  . Smoking status: Never Smoker   . Smokeless tobacco: Never Used  . Alcohol Use: No  . Drug Use: No  . Sexual Activity: No   Other Topics Concern  . Not on file   Social History Narrative    Family History  Problem Relation Age of Onset  . Heart disease Brother     ischemic  . Hypertension    . Stroke     ROS General: Negative; No fevers, chills, or night sweats;  HEENT: Negative; No changes in vision or hearing, sinus congestion, difficulty swallowing Pulmonary: Negative; No cough, wheezing, shortness of breath, hemoptysis Cardiovascular: see HPI GI: Negative; No nausea, vomiting, diarrhea, or abdominal pain GU: Negative; No dysuria, hematuria, or difficulty voiding Musculoskeletal: Negative; no myalgias, joint pain, or weakness Hematologic/Oncology: Positive for hypothyroidism; no easy bruising, bleeding Endocrine: Negative; no heat/cold intolerance Neuro: Negative; no changes in balance,  headaches Skin: Negative; No rashes or skin lesions Psychiatric: Negative; No behavioral problems, depression Sleep: Positive for complex, sleep apnea, currently on BiPAP therapy.  She is unaware of any breakthrough  snoring, daytime sleepiness, hypersomnolence, bruxism, restless legs, hypnogognic hallucinations, no cataplexy Other comprehensive 14 point system review is negative    PE BP 122/70 mmHg  Pulse 51  Ht 5' 2"  (1.575 m)  Wt 204 lb 12.8 oz (92.897 kg)  BMI 37.45 kg/m2  General: Alert, oriented, no distress.  Skin: normal turgor, no rashes HEENT: Normocephalic, atraumatic. Pupils round and reactive; sclera anicteric;no lid lag.  Nose without nasal septal hypertrophy Mouth/Parynx benign; Mallinpatti scale 3 Neck: No JVD, no carotid bruits with normal carotid upstroke Chest: No chest  wall tenderness to palpation  Lungs: clear to ausculatation and percussion; no wheezing or rales Heart: RRR, s1 s2 normal  1/6 systolic murmur; no S3 or S4 gallop no diastolic murmur, rubs, thrills or heaves.  Abdomen: soft, nontender; no hepatosplenomehaly, BS+; abdominal aorta nontender and not dilated by palpation. Back: No CVA tenderness  Pulses 2+ Extremities: no clubbing cyanosis or edema, Homan's sign negative  Neurologic: grossly nonfocal Psychologic: normal affect and mood.  ECG (independently read by me): Sinus bradycardia 51 bpm.  No ectopy.  Normal intervals.  January 2016 ECG (independently read by me): Normal sinus rhythm at 62 bpm.  No ectopy.  QTc interval 414 ms.  May 2015 ECG (independently read by me): sinus bradycardia 57 beats per minute.  No ectopy.  Prior ECG : Sinus rhythm at 68 beats per minute. No ectopy per  LABS:   BMP Latest Ref Rng 04/05/2014 03/26/2014 07/24/2013  Glucose 70 - 99 mg/dL 107(H) 84 89  BUN 6 - 23 mg/dL 17 22 23   Creatinine 0.50 - 1.10 mg/dL 0.81 0.83 0.90  Sodium 135 - 145 mmol/L 139 138 135  Potassium 3.5 - 5.1 mmol/L 3.6 4.1 4.2  Chloride 96  - 112 mmol/L 101 100 98  CO2 19 - 32 mmol/L 26 32 31  Calcium 8.4 - 10.5 mg/dL 10.0 10.0 10.0    Hepatic Function Latest Ref Rng 03/26/2014 07/24/2013 09/18/2012  Total Protein 6.0 - 8.3 g/dL 6.8 6.9 7.3  Albumin 3.5 - 5.2 g/dL 3.8 4.2 3.6  AST 0 - 37 U/L 21 26 24   ALT 0 - 35 U/L 14 18 21   Alk Phosphatase 39 - 117 U/L 59 66 76  Total Bilirubin 0.2 - 1.2 mg/dL 0.6 0.5 0.6    CBC Latest Ref Rng 04/05/2014 03/26/2014 07/24/2013  WBC 4.0 - 10.5 K/uL 6.9 5.4 5.4  Hemoglobin 12.0 - 15.0 g/dL 13.5 12.5 12.5  Hematocrit 36.0 - 46.0 % 40.4 38.6 37.5  Platelets 150 - 400 K/uL 243 237 249   Lab Results  Component Value Date   TSH 5.064* 03/26/2014   BNP No results found for: PROBNP    Lipid Panel     Component Value Date/Time   CHOL 203* 03/26/2014 1023   TRIG 96 03/26/2014 1023   HDL 51 03/26/2014 1023   CHOLHDL 4.0 03/26/2014 1023   VLDL 19 03/26/2014 1023   LDLCALC 133* 03/26/2014 1023     RADIOLOGY: No results found.    ASSESSMENT AND PLAN: Brianna Reyes is an 81 year old female with a history of labile hypertension,  palpitations, hyperlipidemia, obesity, and complex sleep apnea.  An echo Doppler study in 2013 showed normal systolic function with suggestive of grade 2 diastolic dysfunction. She had moderate LA dilatation, mitral annular calcification,aortic valve sclerosis with very mild stenosis.  Her blood pressure today is controlled.  Her palpitations have improved with titration of her metoprolol to 100 mg in the morning and 75 mg in the evening.  Blood pressure today is controlled on current therapy, including the metoprolol as above, lisinopril 20 mg and hydralazine 50 mg 3 times a day.  She feels that she is sleeping better and is averaging 9 hours and 27 minutes of sleep per night.  I suspect her increased AHI seen on her download is due to both residual obstructive events as well as some central events.  Initially, I'm recommending that we increase her EPAP minimum  pressure from 10 to 12.  This should improve obstructive events, particularly if  they are occurring early in the night.  However, if her AHI continues to be elevated ASV titration will be necessary.  I will see her in 6 months for reevaluation   Time spent: 25 minutes  Troy Sine, MD, [facility]  06/27/2014 11:39 AM

## 2014-08-13 ENCOUNTER — Other Ambulatory Visit: Payer: Self-pay | Admitting: Cardiovascular Disease

## 2014-08-13 MED ORDER — METOPROLOL TARTRATE 50 MG PO TABS: ORAL_TABLET | ORAL | Status: DC

## 2014-10-08 DIAGNOSIS — G4733 Obstructive sleep apnea (adult) (pediatric): Secondary | ICD-10-CM | POA: Diagnosis not present

## 2014-10-12 DIAGNOSIS — M1711 Unilateral primary osteoarthritis, right knee: Secondary | ICD-10-CM | POA: Diagnosis not present

## 2014-10-14 ENCOUNTER — Other Ambulatory Visit: Payer: Self-pay | Admitting: Cardiovascular Disease

## 2014-10-14 NOTE — Telephone Encounter (Signed)
REFILL 

## 2015-01-02 ENCOUNTER — Other Ambulatory Visit: Payer: Self-pay | Admitting: Cardiovascular Disease

## 2015-01-12 ENCOUNTER — Other Ambulatory Visit: Payer: Self-pay | Admitting: Cardiovascular Disease

## 2015-04-05 ENCOUNTER — Other Ambulatory Visit: Payer: Self-pay | Admitting: Cardiovascular Disease

## 2015-04-12 ENCOUNTER — Other Ambulatory Visit: Payer: Self-pay | Admitting: *Deleted

## 2015-04-12 MED ORDER — LISINOPRIL-HYDROCHLOROTHIAZIDE 20-12.5 MG PO TABS: 1.0000 | ORAL_TABLET | Freq: Every morning | ORAL | Status: DC

## 2015-04-12 NOTE — Telephone Encounter (Signed)
REFILL 

## 2015-04-12 NOTE — Telephone Encounter (Signed)
Follow Up  Pt called made appt for 04/14 @ 2:30p with Dr.Kelly.   *STAT* If patient is at the pharmacy, call can be transferred to refill team.   1. Which medications need to be refilled? (please list name of each medication and dose if known) lisinopril 20-12.5 mg tab  2. Which pharmacy/location (including street and city if local pharmacy) is medication to be sent to? [facility] on [address]. In front of [address].  3. Do they need a 30 day or 90 day supply? 90 day supply

## 2015-05-04 DIAGNOSIS — G4733 Obstructive sleep apnea (adult) (pediatric): Secondary | ICD-10-CM | POA: Diagnosis not present

## 2015-05-07 ENCOUNTER — Other Ambulatory Visit: Payer: Self-pay | Admitting: Cardiovascular Disease

## 2015-05-07 NOTE — Telephone Encounter (Signed)
Rx(s) sent to pharmacy electronically.  

## 2015-06-25 ENCOUNTER — Ambulatory Visit: Payer: Medicare Other | Admitting: Cardiovascular Disease

## 2015-07-03 ENCOUNTER — Other Ambulatory Visit: Payer: Self-pay | Admitting: Cardiovascular Disease

## 2015-07-05 NOTE — Telephone Encounter (Signed)
REFILL 

## 2015-08-03 ENCOUNTER — Other Ambulatory Visit: Payer: Self-pay | Admitting: Cardiovascular Disease

## 2015-08-03 NOTE — Telephone Encounter (Signed)
Rx(s) sent to pharmacy electronically.  

## 2015-08-23 ENCOUNTER — Telehealth: Payer: Self-pay | Admitting: Cardiovascular Disease

## 2015-08-23 NOTE — Telephone Encounter (Signed)
Returned call to pt.  She is complaining of swelling in her feet and ankles that started yesterday. It got better after she laid down last night but swelled again as she was up doing things today. Her BP this morning was 135/58, HR 60, and 161/61, HR 60's this afternoon. She said that's the normal trend of her BP each day. She denies SOB or CP. Sometimes she has palpitations. She is concerned she needs a stress test, I told her the doctor or PA would need to determine that. She said the swelling could be coming from her arthritis but she is concerned about her heart. She has a ov with her orthopaedic doctor the end of next week. Her last ov was 06/2014 with Dr Claiborne Billings, visit scheduled on 08/25/15 with APP for pt to be seen.  She verbalized understanding.

## 2015-08-23 NOTE — Telephone Encounter (Signed)
New Message  Pt c/o swelling: STAT is pt has developed SOB within 24 hours  1. How long have you been experiencing swelling? Within the last day  2. Where is the swelling located? Ankles and feet more than anywhere else. The top of her feet are puffed up  3.  Are you currently taking a "fluid pill"? Yes, Used to take it twice a day but with the lisinopril she can only take it in the morning.  4.  Are you currently SOB? Yes sometime a little 5.  Have you traveled recently?   Comments: No other symptoms other than weakness. Has to use a can or she will fall. She believed that it was coming from her knees but it may be coming from her heart and this is what she is wanting to clear up per pt.   Pt prefers to speak with nurse [name] only

## 2015-08-25 ENCOUNTER — Ambulatory Visit (INDEPENDENT_AMBULATORY_CARE_PROVIDER_SITE_OTHER): Payer: Medicare Other | Admitting: Nurse Practitioner

## 2015-08-25 ENCOUNTER — Encounter: Payer: Self-pay | Admitting: Nurse Practitioner

## 2015-08-25 VITALS — BP 144/82 | HR 54 | Ht 62.0 in | Wt 204.4 lb

## 2015-08-25 DIAGNOSIS — I10 Essential (primary) hypertension: Secondary | ICD-10-CM

## 2015-08-25 DIAGNOSIS — G4733 Obstructive sleep apnea (adult) (pediatric): Secondary | ICD-10-CM

## 2015-08-25 DIAGNOSIS — R600 Localized edema: Secondary | ICD-10-CM | POA: Diagnosis not present

## 2015-08-25 DIAGNOSIS — R002 Palpitations: Secondary | ICD-10-CM | POA: Diagnosis not present

## 2015-08-25 NOTE — Patient Instructions (Signed)
Your physician recommends that you schedule a follow-up appointment in: 1 month with Dr Claiborne Billings or Gerald Stabs

## 2015-08-25 NOTE — Progress Notes (Signed)
Office Visit    Patient Name: Brianna Reyes Date of Encounter: 08/25/2015  Primary Care Provider:  Horatio Pel, MD Primary Cardiologist:  Corky Downs, MD   Chief Complaint    82 year old female with a history of palpitations and hypertension, who presents for follow-up related to pedal edema.  Past Medical History    Past Medical History  Diagnosis Date  . Brain tumor ([place])   . Hypothyroidism   . Sleep apnea 04/22/08    SLEEP STUDY done @ [facility]. Read by Dr. Keturah Barre. Repeat study done 05/22/11 @ [facility].  . Palpitations     a. 10/2011 Cardionet; b. 02/2014 Cardionet: sinus rhythm, occas unifocal isolated PVC's, no signif brady/tachy.  . Meningioma ([place])   . Essential hypertension     a. 10/2011 Echo: EF >55%; b. 04/2014 Echo: EF 60-65%, triv AI, mildly dil RA.  Marland Kitchen Arthritis   . Pedal edema    Past Surgical History  Procedure Laterality Date  . Appendectomy    . Tonsillectomy    . Tubal ligation      Allergies  No Known Allergies  History of Present Illness    82 year old female with a prior history of hypertension, palpitations, hypothyroidism, hyperlipidemia, and sleep apnea. She has previously been documented to have isolated PVCs on monitoring in 2015. Her last echo in February 2016 showed normal LV function without significant valvular disease. Over the past 2 days, she has noted pedal edema involving the dorsal surface of both feet, though the right is slightly worse than the left. Edema has not involved the ankles or above. She has not been experiencing any chest pain, dyspnea, PND, orthopnea, dizziness, syncope, or early satiety. She reports that she has been sitting for large portions of the day and also that she often snacks on salty foods like potato chips. Edema is not present first thing in the morning but comes on throughout the day. She is concerned that this may be related to her heart.  Home Medications    Prior to  Admission medications   Medication Sig Start Date End Date Taking? Authorizing Provider  ASTAXANTHIN PO Take 1 tablet by mouth daily.   Yes Historical Provider, MD  beta carotene w/minerals (OCUVITE) tablet Take 1 tablet by mouth daily.   Yes Historical Provider, MD  co-enzyme Q-10 30 MG capsule Take 30 mg by mouth daily.    Yes Historical Provider, MD  hydrALAZINE (APRESOLINE) 50 MG tablet Take 50 mg by mouth 2 (two) times daily.   Yes Historical Provider, MD  levothyroxine (SYNTHROID, LEVOTHROID) 88 MCG tablet Take 88 mcg by mouth daily before breakfast.   Yes Historical Provider, MD  lisinopril (PRINIVIL,ZESTRIL) 20 MG tablet TAKE 1 TABLET (20 MG TOTAL) BY MOUTH DAILY. 01/04/15  Yes Troy Sine, MD  lisinopril-hydrochlorothiazide (PRINZIDE,ZESTORETIC) 20-12.5 MG tablet Take 1 tablet by mouth every morning. NEED OV. 07/05/15  Yes Troy Sine, MD  LUTEIN-ZEAXANTHIN PO Take 1 tablet by mouth daily.   Yes Historical Provider, MD  metoprolol (LOPRESSOR) 50 MG tablet TAKE 2 TABLETS IN THE MORNING AND 1 & 1/2 TABLETS IN THE EVENING 05/07/15  Yes Troy Sine, MD  Misc Natural Products (LUTEIN 20 PO) Take 1 capsule by mouth daily.   Yes Historical Provider, MD  Multiple Vitamin (MULTIVITAMIN WITH MINERALS) TABS tablet Take 1 tablet by mouth daily. Contains vitamin C   Yes Historical Provider, MD  NON FORMULARY CPAP THERAPY   Yes  Historical Provider, MD  omega-3 acid ethyl esters (LOVAZA) 1 G capsule Take 1 g by mouth daily.    Yes Historical Provider, MD    Review of Systems    Pedal edema as above. She also has right knee osteoarthritis with chronic knee pain upon ambulation.  She denies chest pain, palpitations, PND, orthopnea, dizziness, syncope, or early satiety.  All other systems reviewed and are otherwise negative except as noted above.  Physical Exam    VS:  BP 144/82 mmHg  Pulse 54  Ht 5\' 2"  (1.575 m)  Wt 204 lb 6.4 oz (92.715 kg)  BMI 37.38 kg/m2 , BMI Body mass index is 37.38  kg/(m^2). GEN: Well nourished, well developed, in no acute distress. HEENT: normal. Neck: Supple, no JVD, carotid bruits, or masses. Cardiac: RRR, no murmurs, rubs, or gallops. No clubbing, cyanosis, trace to 1+ bilateral pedal edema.  Radials/DP/PT 2+ and equal bilaterally.  Respiratory:  Respirations regular and unlabored, clear to auscultation bilaterally. GI: Soft, nontender, nondistended, BS + x 4. MS: no deformity or atrophy. Skin: warm and dry, no rash. Neuro:  Strength and sensation are intact. Psych: Normal affect.  Accessory Clinical Findings    ECG - Sinus bradycardia, 54, no acute ST or T changes.  Assessment & Plan    1.  Pedal edema: Patient reports a 2 day history of edema involving the dorsal surface of both feet. The right is slightly worse than the left but both are trace to 1+ only. Exam is otherwise notable for euvolemia. Her weight is stable at 204 pounds. She has not been having any dyspnea or orthopnea. LV function was normal in February 2016. She admits to over salting, frequently eating potato chips. She also spends much of her day with her feet in a dependent position. She is already taking HCTZ 12.5 mg daily. I recommended that she restricts salt in her diet and avoid processed foods and salty snacks. I also recommended that she keep her legs elevated when sitting. She does have a recliner. I do not think she needs additional diuresis and suspect that her pedal edema is more likely related to venous stasis than heart failure. I did offer her a 2-D echocardiogram to reevaluate LV function as she is quite nervous about this, however she did not want to try and schedule an echo or go to [address] to have it done. I also offered lab work such as a basic metabolic panel and BNP, though she preferred to have blood work done with her primary care provider at her pending annual visit. I think this is reasonable.  2. Essential hypertension: Blood pressure is mildly elevated  today at 144/82. She is on hydralazine, lisinopril, HCTZ, and beta blocker therapy. She says pressures at home, which she checks multiple times throughout the day, typically run in the 1 teens throughout most the day and then may increase to the 140s in the evening. I will not make any changes to her blood pressure medicines today and suspect that if she limits her salt, she will achieve better blood pressure control.  3. Palpitations:  She has not had many palpitations and overall has been stable. She remains on beta blocker therapy.  4. Obstructive sleep apnea: This is followed closely by Dr. Claiborne Billings here and she is compliant with sleep apnea.  5.  Disposition: Patient will limit salt and keep legs elevated. She says she is trying to lose weight as well. She will follow-up with Dr. Claiborne Billings in  1 month or sooner if necessary.   Murray Hodgkins, NP 08/25/2015, 1:06 PM

## 2015-09-03 DIAGNOSIS — M1711 Unilateral primary osteoarthritis, right knee: Secondary | ICD-10-CM | POA: Diagnosis not present

## 2015-09-22 DIAGNOSIS — R300 Dysuria: Secondary | ICD-10-CM | POA: Diagnosis not present

## 2015-09-22 DIAGNOSIS — N390 Urinary tract infection, site not specified: Secondary | ICD-10-CM | POA: Diagnosis not present

## 2015-09-28 DIAGNOSIS — R350 Frequency of micturition: Secondary | ICD-10-CM | POA: Diagnosis not present

## 2015-09-30 ENCOUNTER — Other Ambulatory Visit: Payer: Self-pay

## 2015-09-30 ENCOUNTER — Telehealth: Payer: Self-pay

## 2015-09-30 ENCOUNTER — Other Ambulatory Visit: Payer: Self-pay | Admitting: Cardiovascular Disease

## 2015-09-30 NOTE — Telephone Encounter (Signed)
Received a refill request for lisinopril 20 mg, spoke with patient, she said she takes lisinopril/hctz not just lisinopril \

## 2015-10-03 ENCOUNTER — Other Ambulatory Visit: Payer: Self-pay | Admitting: Cardiovascular Disease

## 2015-10-05 ENCOUNTER — Ambulatory Visit: Payer: Medicare Other | Admitting: Nurse Practitioner

## 2015-11-08 DIAGNOSIS — I10 Essential (primary) hypertension: Secondary | ICD-10-CM | POA: Diagnosis not present

## 2015-11-08 DIAGNOSIS — E039 Hypothyroidism, unspecified: Secondary | ICD-10-CM | POA: Diagnosis not present

## 2015-12-28 ENCOUNTER — Other Ambulatory Visit: Payer: Self-pay | Admitting: Cardiovascular Disease

## 2016-01-04 ENCOUNTER — Other Ambulatory Visit: Payer: Self-pay | Admitting: Cardiovascular Disease

## 2016-01-12 DIAGNOSIS — H353131 Nonexudative age-related macular degeneration, bilateral, early dry stage: Secondary | ICD-10-CM | POA: Diagnosis not present

## 2016-01-12 DIAGNOSIS — H25813 Combined forms of age-related cataract, bilateral: Secondary | ICD-10-CM | POA: Diagnosis not present

## 2016-01-20 ENCOUNTER — Other Ambulatory Visit: Payer: Self-pay

## 2016-01-20 MED ORDER — LISINOPRIL-HYDROCHLOROTHIAZIDE 20-12.5 MG PO TABS: 1.0000 | ORAL_TABLET | Freq: Every morning | ORAL | 1 refills | Status: DC

## 2016-01-24 DIAGNOSIS — E039 Hypothyroidism, unspecified: Secondary | ICD-10-CM | POA: Diagnosis not present

## 2016-01-24 DIAGNOSIS — R432 Parageusia: Secondary | ICD-10-CM | POA: Diagnosis not present

## 2016-01-31 DIAGNOSIS — Z23 Encounter for immunization: Secondary | ICD-10-CM | POA: Diagnosis not present

## 2016-01-31 DIAGNOSIS — R432 Parageusia: Secondary | ICD-10-CM | POA: Diagnosis not present

## 2016-02-10 DIAGNOSIS — G4733 Obstructive sleep apnea (adult) (pediatric): Secondary | ICD-10-CM | POA: Diagnosis not present

## 2016-05-08 DIAGNOSIS — G4733 Obstructive sleep apnea (adult) (pediatric): Secondary | ICD-10-CM | POA: Diagnosis not present

## 2016-05-26 ENCOUNTER — Other Ambulatory Visit: Payer: Self-pay | Admitting: Cardiovascular Disease

## 2016-05-26 DIAGNOSIS — G4733 Obstructive sleep apnea (adult) (pediatric): Secondary | ICD-10-CM | POA: Diagnosis not present

## 2016-05-26 NOTE — Telephone Encounter (Signed)
Rx(s) sent to pharmacy electronically.  

## 2016-06-22 ENCOUNTER — Other Ambulatory Visit: Payer: Self-pay | Admitting: Cardiovascular Disease

## 2016-06-22 NOTE — Telephone Encounter (Signed)
NEEDS APPT 

## 2016-07-04 ENCOUNTER — Telehealth: Payer: Self-pay | Admitting: Cardiovascular Disease

## 2016-07-04 MED ORDER — HYDRALAZINE HCL 50 MG PO TABS: 50.0000 mg | ORAL_TABLET | Freq: Three times a day (TID) | ORAL | 0 refills | Status: DC

## 2016-07-04 NOTE — Telephone Encounter (Signed)
Hydralazine Rx sent to pharmacy---Pt needs appt for more refills.

## 2016-07-12 DIAGNOSIS — M8580 Other specified disorders of bone density and structure, unspecified site: Secondary | ICD-10-CM | POA: Diagnosis not present

## 2016-07-12 DIAGNOSIS — I10 Essential (primary) hypertension: Secondary | ICD-10-CM | POA: Diagnosis not present

## 2016-07-12 DIAGNOSIS — E559 Vitamin D deficiency, unspecified: Secondary | ICD-10-CM | POA: Diagnosis not present

## 2016-07-12 DIAGNOSIS — Z0000 Encounter for general adult medical examination without abnormal findings: Secondary | ICD-10-CM | POA: Diagnosis not present

## 2016-07-16 ENCOUNTER — Other Ambulatory Visit: Payer: Self-pay | Admitting: Cardiovascular Disease

## 2016-07-18 NOTE — Telephone Encounter (Signed)
Rx(s) sent to pharmacy electronically.  

## 2016-07-19 DIAGNOSIS — Z0001 Encounter for general adult medical examination with abnormal findings: Secondary | ICD-10-CM | POA: Diagnosis not present

## 2016-07-19 DIAGNOSIS — R432 Parageusia: Secondary | ICD-10-CM | POA: Diagnosis not present

## 2016-07-19 DIAGNOSIS — E039 Hypothyroidism, unspecified: Secondary | ICD-10-CM | POA: Diagnosis not present

## 2016-07-19 DIAGNOSIS — R350 Frequency of micturition: Secondary | ICD-10-CM | POA: Diagnosis not present

## 2016-07-28 DIAGNOSIS — L720 Epidermal cyst: Secondary | ICD-10-CM | POA: Diagnosis not present

## 2016-08-03 ENCOUNTER — Other Ambulatory Visit: Payer: Self-pay | Admitting: Cardiovascular Disease

## 2016-08-09 ENCOUNTER — Other Ambulatory Visit: Payer: Self-pay | Admitting: Cardiovascular Disease

## 2016-08-09 NOTE — Telephone Encounter (Signed)
Rx has been sent to the pharmacy electronically. ° °

## 2016-08-14 ENCOUNTER — Other Ambulatory Visit: Payer: Self-pay | Admitting: Cardiovascular Disease

## 2016-08-23 ENCOUNTER — Other Ambulatory Visit: Payer: Self-pay | Admitting: *Deleted

## 2016-08-23 MED ORDER — HYDRALAZINE HCL 50 MG PO TABS: 50.0000 mg | ORAL_TABLET | Freq: Three times a day (TID) | ORAL | 0 refills | Status: DC

## 2016-09-01 ENCOUNTER — Other Ambulatory Visit: Payer: Self-pay | Admitting: Cardiovascular Disease

## 2016-09-05 ENCOUNTER — Encounter: Payer: Self-pay | Admitting: Physician Assistant

## 2016-09-05 ENCOUNTER — Ambulatory Visit (INDEPENDENT_AMBULATORY_CARE_PROVIDER_SITE_OTHER): Payer: Medicare Other | Admitting: Physician Assistant

## 2016-09-05 VITALS — BP 139/68 | HR 62 | Ht 60.0 in | Wt 190.0 lb

## 2016-09-05 DIAGNOSIS — R002 Palpitations: Secondary | ICD-10-CM

## 2016-09-05 DIAGNOSIS — E785 Hyperlipidemia, unspecified: Secondary | ICD-10-CM | POA: Diagnosis not present

## 2016-09-05 DIAGNOSIS — G4733 Obstructive sleep apnea (adult) (pediatric): Secondary | ICD-10-CM

## 2016-09-05 DIAGNOSIS — E039 Hypothyroidism, unspecified: Secondary | ICD-10-CM

## 2016-09-05 DIAGNOSIS — I10 Essential (primary) hypertension: Secondary | ICD-10-CM

## 2016-09-05 NOTE — Patient Instructions (Signed)
Medication Instructions:   Continue on current medications  Labwork:   none  Testing/Procedures:  none  Follow-Up:  With Dr. Claiborne Billings in April 2019  Let us know if you have repeat occurrence of prolonged palpitations.   If you need a refill on your cardiac medications before your next appointment, please call your pharmacy.

## 2016-09-05 NOTE — Progress Notes (Signed)
Cardiology Office Note    Date:  09/05/2016   ID:  Brianna Reyes, DOB 10/23/1930, MRN [id number]  PCP:  Deland Pretty, MD  Cardiologist:  Dr. Claiborne Billings   Chief Complaint  Patient presents with  . Follow-up    seen for Dr. Claiborne Billings    History of Present Illness:  Brianna Reyes is a 83 y.o. female with PMH of HTN, palpitation, hypothyroidism, hyperlipidemia and obstructive sleep apnea. She has documented history of PVCs on monitoring in 2015. Last echocardiogram obtained on 04/13/2014 showed EF 60-65%, trivial aortic regurgitation, mildly dilated right atrium. She was last seen in the cardiology office on 08/25/2015 for evaluation of pedal edema. Salt and fluid restriction was recommended. Echocardiogram was recommended, however it does not appear this has been done.  She presents today for follow-up. Generally she has been doing very well on the current blood pressure medication. She does not take blood pressure medication based on the dosage recorded in Epic. She take half a tablet of lisinopril-hydrochlorothiazide in the morning, she take 20 mg lisinopril alone at night. She also take metoprolol at the reduced dose of 25 mg twice a day instead of the previously prescribed dosage (which was 100mg  AM and 75mg  PM). As far as hydralazine, our record indicate she takes it 3 times a day, she is actually taking it once a day at nighttime. She says her blood morning blood pressure is usually quite good, however she has been noticing her nighttime blood pressure did go up. Despite the fact that she has not been taking her blood pressure medication as documented in Epic, she is actually managing her blood pressure quite well. She denies any chest discomfort or shortness of breath. Pedal edema has completely resolved. Given lack of shortness of breath or lower extremity edema, I will hold off on reordering an echocardiogram. Otherwise she has been doing well from cardiology perspective. She said she did have several  episodes of tachycardia palpitation over 2 weeks ago, she said the symptoms started after her thyroid medication was increased for a few days, she has since went back to the previous dose of thyroid medication. She has not experienced any palpitations since. Due to the prolonged course of tachycardia palpitation, I do not think it was PVCs, she likely had either SVT, atrial flutter or atrial fibrillation, retrospectively, it is impossible to tell. It could be higher than normal dose of thyroid medication that was triggering the symptom. However I recommended her to contact cardiology if she has any recurrent palpitation, if so, I would recommend a 30 day monitor. Otherwise I will continue on the current medications as is.  Past Medical History:  Diagnosis Date  . Arthritis   . Brain tumor ([place])   . Essential hypertension    a. 10/2011 Echo: EF >55%; b. 04/2014 Echo: EF 60-65%, triv AI, mildly dil RA.  Marland Kitchen Hypothyroidism   . Meningioma ([place])   . Palpitations    a. 10/2011 Cardionet; b. 02/2014 Cardionet: sinus rhythm, occas unifocal isolated PVC's, no signif brady/tachy.  . Pedal edema   . Sleep apnea 04/22/08   SLEEP STUDY done @ [facility]. Read by Dr. Keturah Barre. Repeat study done 05/22/11 @ [facility].    Past Surgical History:  Procedure Laterality Date  . APPENDECTOMY    . TONSILLECTOMY    . TUBAL LIGATION      Current Medications: Outpatient Medications Prior to Visit  Medication Sig Dispense Refill  . ASTAXANTHIN  PO Take 1 tablet by mouth daily.    . beta carotene w/minerals (OCUVITE) tablet Take 1 tablet by mouth daily.    . hydrALAZINE (APRESOLINE) 50 MG tablet Take 1 tablet (50 mg total) by mouth 3 (three) times daily. Please schedule appointment for refills. 3rd attempt - [phone] (Patient taking differently: Take 50 mg by mouth daily at 2 PM. Please schedule appointment for refills. 3rd attempt - [phone]) 30 tablet 0  . levothyroxine (SYNTHROID,  LEVOTHROID) 88 MCG tablet Take 88 mcg by mouth daily before breakfast.    . lisinopril (PRINIVIL,ZESTRIL) 20 MG tablet Take 1 tablet (20 mg total) by mouth daily. Please keep upcoming appointment 6/26 at 3 pm for additonal refills thanks. (Patient taking differently: Take 20 mg by mouth daily. ) 15 tablet 0  . lisinopril-hydrochlorothiazide (PRINZIDE,ZESTORETIC) 20-12.5 MG tablet Take 1 tablet by mouth daily. Need appointment before anymore refill (Patient taking differently: Take 0.5 tablets by mouth daily. Need appointment before anymore refill) 30 tablet 0  . LUTEIN-ZEAXANTHIN PO Take 1 tablet by mouth daily.    . metoprolol (LOPRESSOR) 50 MG tablet TAKE 2 TABLETS IN THE MORNING AND 1 & 1/2 TABLETS IN THE EVENING (Patient taking differently: take 1/2 tablet BID) 315 tablet 2  . Misc Natural Products (LUTEIN 20 PO) Take 1 capsule by mouth daily.    . Multiple Vitamin (MULTIVITAMIN WITH MINERALS) TABS tablet Take 1 tablet by mouth daily. Contains vitamin C    . NON FORMULARY CPAP THERAPY    . omega-3 acid ethyl esters (LOVAZA) 1 G capsule Take 1 g by mouth daily.     Marland Kitchen lisinopril-hydrochlorothiazide (PRINZIDE,ZESTORETIC) 20-12.5 MG tablet Take 1 tablet by mouth every morning. PLEASE CONTACT OFFICE FOR ADDITIONAL REFILLS FINAL WARNING (Patient taking differently: Take 0.5 tablets by mouth every morning. PLEASE CONTACT OFFICE FOR ADDITIONAL REFILLS FINAL WARNING) 7 tablet 0  . co-enzyme Q-10 30 MG capsule Take 30 mg by mouth daily.      No facility-administered medications prior to visit.      Allergies:   Patient has no known allergies.   Social History   Social History  . Marital status: Single    Spouse name: N/A  . Number of children: N/A  . Years of education: N/A   Social History Main Topics  . Smoking status: Never Smoker  . Smokeless tobacco: Never Used  . Alcohol use No  . Drug use: No  . Sexual activity: No   Other Topics Concern  . None   Social History Narrative  . None      Family History:  The patient's family history includes Heart disease in her brother.   ROS:   Please see the history of present illness.    ROS All other systems reviewed and are negative.   PHYSICAL EXAM:   VS:  BP 139/68   Pulse 62   Ht 5' (1.524 m)   Wt 190 lb (86.2 kg)   BMI 37.11 kg/m    GEN: Well nourished, well developed, in no acute distress  HEENT: normal  Neck: no JVD, carotid bruits, or masses Cardiac: RRR; no murmurs, rubs, or gallops,no edema  Respiratory:  clear to auscultation bilaterally, normal work of breathing GI: soft, nontender, nondistended, + BS MS: no deformity or atrophy  Skin: warm and dry, no rash Neuro:  Alert and Oriented x 3, Strength and sensation are intact Psych: euthymic mood, full affect  Wt Readings from Last 3 Encounters:  09/05/16 190 lb (86.2 kg)  08/25/15 204 lb 6.4 oz (92.7 kg)  06/25/14 204 lb 12.8 oz (92.9 kg)      Studies/Labs Reviewed:   EKG:  EKG is ordered today.  The ekg ordered today demonstrates Normal sinus rhythm, poor R wave progression in anterior leads, otherwise no significant ST-T wave changes.  Recent Labs: No results found for requested labs within last 8760 hours.   Lipid Panel    Component Value Date/Time   CHOL 203 (H) 03/26/2014 1023   TRIG 96 03/26/2014 1023   HDL 51 03/26/2014 1023   CHOLHDL 4.0 03/26/2014 1023   VLDL 19 03/26/2014 1023   LDLCALC 133 (H) 03/26/2014 1023    Additional studies/ records that were reviewed today include:   Echo 04/13/2014 LV EF: 60% -  65%  Study Conclusions  - Left ventricle: The cavity size was normal. Wall thickness was normal. Systolic function was normal. The estimated ejection fraction was in the range of 60% to 65%. - Aortic valve: There was trivial regurgitation. - Right atrium: The atrium was mildly dilated.   ASSESSMENT:    1. Palpitations   2. Essential hypertension   3. Hypothyroidism, unspecified type   4. Hyperlipidemia,  unspecified hyperlipidemia type   5. OSA (obstructive sleep apnea)      PLAN:  In order of problems listed above:  1. Palpitation: She had several episodes of prolonged tachycardia palpitation after her thyroid medication was increased, last episode 2 weeks ago. Since then, she has went back to her previous dose of thyroid medication, palpitation and has not recurred again. I did not increase her metoprolol today as her heart rate is 60 bpm. If her symptom does recur again, I would recommend a 30 day event monitor. Her symptom does not sound like PVC.  2. Hypertension: Blood pressure stable. We will reconsult her home blood pressure dosage based on how much she is taking. Despite the fact she has not been taking her blood pressure medication based on that dosage that was prescribed, she is actually managing her blood pressure quite well. Given the fact she is taking HCTZ and lisinopril, she will need at least annual basic metabolic panel. She said she just recently obtained laboratory work a month ago at her primary care physician's office. We will request the record.  Addendum: Lab work received from her primary care physician's office, renal function and electrolytes stable on last lab work 07/12/2016  3. Hyperlipidemia: We'll defer to primary care physician.   Addendum: Based on lab work received from primary care physician's office, last lipid panel obtained on 07/12/2016 showed total cholesterol 228, HDL 67, LDL 142, triglyceride 93. Recommend discussion of adding low-dose statin medication.   Medication Adjustments/Labs and Tests Ordered: Current medicines are reviewed at length with the patient today.  Concerns regarding medicines are outlined above.  Medication changes, Labs and Tests ordered today are listed in the Patient Instructions below. Patient Instructions  Medication Instructions:   Continue on current medications  Labwork:    none  Testing/Procedures:  none  Follow-Up:  With Dr. Claiborne Billings in April 2019  Let us know if you have repeat occurrence of prolonged palpitations.   If you need a refill on your cardiac medications before your next appointment, please call your pharmacy.      Hilbert Corrigan, [place]  09/05/2016 5:07 PM    [facility] [address], [place], [place]  [address] Phone: [phone]; Fax: [phone]

## 2016-09-18 ENCOUNTER — Other Ambulatory Visit: Payer: Self-pay | Admitting: Cardiovascular Disease

## 2016-09-22 DIAGNOSIS — G4733 Obstructive sleep apnea (adult) (pediatric): Secondary | ICD-10-CM | POA: Diagnosis not present

## 2016-11-29 ENCOUNTER — Encounter (HOSPITAL_COMMUNITY): Payer: Self-pay

## 2016-11-29 ENCOUNTER — Emergency Department (HOSPITAL_COMMUNITY)
Admission: EM | Admit: 2016-11-29 | Discharge: 2016-11-29 | Disposition: A | Discharge status: Home / Self Care | Payer: Medicare Other | Attending: Emergency Medicine | Admitting: Emergency Medicine

## 2016-11-29 ENCOUNTER — Emergency Department (HOSPITAL_COMMUNITY): Payer: Medicare Other

## 2016-11-29 DIAGNOSIS — I10 Essential (primary) hypertension: Secondary | ICD-10-CM | POA: Insufficient documentation

## 2016-11-29 DIAGNOSIS — E039 Hypothyroidism, unspecified: Secondary | ICD-10-CM | POA: Diagnosis not present

## 2016-11-29 DIAGNOSIS — R002 Palpitations: Secondary | ICD-10-CM | POA: Diagnosis present

## 2016-11-29 DIAGNOSIS — R0600 Dyspnea, unspecified: Secondary | ICD-10-CM | POA: Diagnosis not present

## 2016-11-29 DIAGNOSIS — I4891 Unspecified atrial fibrillation: Secondary | ICD-10-CM | POA: Diagnosis not present

## 2016-11-29 DIAGNOSIS — Z79899 Other long term (current) drug therapy: Secondary | ICD-10-CM | POA: Insufficient documentation

## 2016-11-29 DIAGNOSIS — R030 Elevated blood-pressure reading, without diagnosis of hypertension: Secondary | ICD-10-CM | POA: Diagnosis not present

## 2016-11-29 LAB — BASIC METABOLIC PANEL
Anion gap: 11 (ref 5–15)
BUN: 15 mg/dL (ref 6–20)
CHLORIDE: 101 mmol/L (ref 101–111)
CO2: 25 mmol/L (ref 22–32)
Calcium: 10 mg/dL (ref 8.9–10.3)
Creatinine, Ser: 0.78 mg/dL (ref 0.44–1.00)
GFR calc non Af Amer: >60 mL/min (ref >60)
Glucose, Bld: 94 mg/dL (ref 65–99)
POTASSIUM: 4.7 mmol/L (ref 3.5–5.1)
SODIUM: 137 mmol/L (ref 135–145)

## 2016-11-29 LAB — CBC
HEMATOCRIT: 40.9 % (ref 36.0–46.0)
HEMOGLOBIN: 13 g/dL (ref 12.0–15.0)
MCH: 29.1 pg (ref 26.0–34.0)
MCHC: 31.8 g/dL (ref 30.0–36.0)
MCV: 91.7 fL (ref 78.0–100.0)
PLATELETS: 282 10*3/uL (ref 150–400)
RBC: 4.46 MIL/uL (ref 3.87–5.11)
RDW: 15.2 % (ref 11.5–15.5)
WBC: 6.2 10*3/uL (ref 4.0–10.5)

## 2016-11-29 LAB — I-STAT TROPONIN, ED: Troponin i, poc: 0 ng/mL (ref 0.00–0.08)

## 2016-11-29 LAB — BRAIN NATRIURETIC PEPTIDE: B NATRIURETIC PEPTIDE 5: 29.2 pg/mL (ref 0.0–100.0)

## 2016-11-29 LAB — MAGNESIUM: Magnesium: 2.2 mg/dL (ref 1.7–2.4)

## 2016-11-29 MED ORDER — APIXABAN 5 MG PO TABS
5.0000 mg | ORAL_TABLET | Freq: Once | ORAL | Status: AC
  Administered 2016-11-29: 5 mg via ORAL
  Filled 2016-11-29: qty 1

## 2016-11-29 MED ORDER — APIXABAN 5 MG PO TABS: 5.0000 mg | ORAL_TABLET | Freq: Two times a day (BID) | ORAL | 0 refills | Status: DC

## 2016-11-29 MED ORDER — METOPROLOL TARTRATE 5 MG/5ML IV SOLN
5.0000 mg | Freq: Once | INTRAVENOUS | Status: AC
  Administered 2016-11-29: 5 mg via INTRAVENOUS
  Filled 2016-11-29: qty 5

## 2016-11-29 MED ORDER — METOPROLOL TARTRATE 25 MG PO TABS
50.0000 mg | ORAL_TABLET | Freq: Once | ORAL | Status: AC
  Administered 2016-11-29: 50 mg via ORAL
  Filled 2016-11-29: qty 2

## 2016-11-29 NOTE — ED Notes (Signed)
Pt refused chest xray, EDP aware.

## 2016-11-29 NOTE — Discharge Instructions (Signed)
Please read and follow all provided instructions.  Your diagnoses today include:  1. Atrial fibrillation, unspecified type ([place])     Tests performed today include:  Vital signs. See below for your results today.   Medications prescribed:   Take as prescribed   Home care instructions:  Follow any educational materials contained in this packet.  Follow-up instructions: Please follow-up with Cardiologist for further evaluation of symptoms and treatment. Please call the office tomorrow  Return instructions:   Please return to the Emergency Department if you do not get better, if you get worse, or new symptoms OR  - Fever (temperature greater than 101.12F)  - Bleeding that does not stop with holding pressure to the area    -Severe pain (please note that you may be more sore the day after your accident)  - Chest Pain  - Difficulty breathing  - Severe nausea or vomiting  - Inability to tolerate food and liquids  - Passing out  - Skin becoming red around your wounds  - Change in mental status (confusion or lethargy)  - New numbness or weakness     Please return if you have any other emergent concerns.  Additional Information:  Your vital signs today were: BP (!) 155/83    Pulse 90    Temp 98.2 F (36.8 C) (Oral)    Resp 15    SpO2 98%  If your blood pressure (BP) was elevated above 135/85 this visit, please have this repeated by your doctor within one month. ---------------  Information on my medicine - ELIQUIS (apixaban)  This medication education was reviewed with me or my healthcare representative as part of my discharge preparation.    Why was Eliquis prescribed for you? Eliquis was prescribed for you to reduce the risk of a blood clot forming that can cause a stroke if you have a medical condition called atrial fibrillation (a type of irregular heartbeat).  What do You need to know about Eliquis ? Take your Eliquis TWICE DAILY - one tablet in the morning and one  tablet in the evening with or without food. If you have difficulty swallowing the tablet whole please discuss with your pharmacist how to take the medication safely.  Take Eliquis exactly as prescribed by your doctor and DO NOT stop taking Eliquis without talking to the doctor who prescribed the medication.  Stopping may increase your risk of developing a stroke.  Refill your prescription before you run out.  After discharge, you should have regular check-up appointments with your healthcare provider that is prescribing your Eliquis.  In the future your dose may need to be changed if your kidney function or weight changes by a significant amount or as you get older.  What do you do if you miss a dose? If you miss a dose, take it as soon as you remember on the same day and resume taking twice daily.  Do not take more than one dose of ELIQUIS at the same time to make up a missed dose.  Important Safety Information A possible side effect of Eliquis is bleeding. You should call your healthcare provider right away if you experience any of the following: ? Bleeding from an injury or your nose that does not stop. ? Unusual colored urine (red or dark brown) or unusual colored stools (red or black). ? Unusual bruising for unknown reasons. ? A serious fall or if you hit your head (even if there is no bleeding).  Some medicines may interact  with Eliquis and might increase your risk of bleeding or clotting while on Eliquis. To help avoid this, consult your healthcare provider or pharmacist prior to using any new prescription or non-prescription medications, including herbals, vitamins, non-steroidal anti-inflammatory drugs (NSAIDs) and supplements.  This website has more information on Eliquis (apixaban): http://www.eliquis.com/eliquis/home

## 2016-11-29 NOTE — ED Triage Notes (Signed)
Pt BIB ems from dental office with c.o hypertension and atrial fib on the monitor. Pt ststes she felt "pressure behind her ears" that made her feel like she was hypertensive. Pt states she took all three of her Bp meds this am. Bp 220/110 with ems HR ranging from 84-138 in a fib. Pt a.o, nad. Pt has hx of irregular heart rhythm but is not on any blood thinners.

## 2016-11-29 NOTE — ED Notes (Signed)
PT states understanding of care given, follow up care, and medication prescribed. PT is ambulated from ED to car with a steady gait.  

## 2016-11-29 NOTE — ED Provider Notes (Signed)
[facility] DEPT Provider Note   CSN: [id number] Arrival date & time: 11/29/16  1546     History   Chief Complaint Chief Complaint  Patient presents with  . Atrial Fibrillation  . Hypertension    HPI Brianna Reyes is a 83 y.o. female.  HPI  83 y.o. female with a hx of HTN, palpitation, hypothyroidism, hyperlipidemia and obstructive sleep apnea, presents to the Emergency Department today via EMS from dentist office due to HTN with systolic are 630Z as well as atrial fibrillation. Pt states this occurred between 1430 and 1500 when she was getting her cavities filled. Notes feeling palpitations in her chest since then. Notes hx of palpitations in past with no diagnosis of atrial fibrillation. Pt does not take blood thinners. Sees Cardiologist for HTN. No N/V. No diaphoresis. No fevers. No cough/congestion. No headaches. No visual changes. No other symptoms noted   Echocardiogram 04-2014 - Left ventricle: The cavity size was normal. Wall thickness was normal. Systolic function was normal. The estimated ejection fraction was in the range of 60% to 65%. - Aortic valve: There was trivial regurgitation. - Right atrium: The atrium was mildly dilated.  Cardiologist- Dr. Claiborne Billings  Past Medical History:  Diagnosis Date  . Arthritis   . Brain tumor ([place])   . Essential hypertension    a. 10/2011 Echo: EF >55%; b. 04/2014 Echo: EF 60-65%, triv AI, mildly dil RA.  Marland Kitchen Hypothyroidism   . Meningioma ([place])   . Palpitations    a. 10/2011 Cardionet; b. 02/2014 Cardionet: sinus rhythm, occas unifocal isolated PVC's, no signif brady/tachy.  . Pedal edema   . Sleep apnea 04/22/08   SLEEP STUDY done @ [facility]. Read by Dr. Keturah Barre. Repeat study done 05/22/11 @ [facility].    Patient Active Problem List   Diagnosis Date Noted  . Pedal edema   . Essential hypertension   . Complex sleep apnea syndrome 06/27/2014  . Aortic valve disease 03/19/2014  . HTN (hypertension)  02/24/2013  . Pyelonephritis 09/19/2012  . UTI (lower urinary tract infection) 09/19/2012  . Obstructive sleep apnea 06/03/2008  . BENIGN NEOPLASM OF BRAIN 05/18/2008  . Hypothyroidism 05/18/2008  . ANXIETY STATE, UNSPECIFIED 05/18/2008  . PALPITATIONS 05/18/2008  . HYPERTENSION NEC 05/18/2008  . PAROXYSMAL NOCTURNAL DYSPNEA 05/15/2008    Past Surgical History:  Procedure Laterality Date  . APPENDECTOMY    . TONSILLECTOMY    . TUBAL LIGATION      OB History    No data available       Home Medications    Prior to Admission medications   Medication Sig Start Date End Date Taking? Authorizing Provider  ASTAXANTHIN PO Take 1 tablet by mouth daily.    [provider]  beta carotene w/minerals (OCUVITE) tablet Take 1 tablet by mouth daily.    [provider]  hydrALAZINE (APRESOLINE) 50 MG tablet Take 1 tablet (50 mg total) by mouth 3 (three) times daily. Please schedule appointment for refills. 3rd attempt - [phone] Patient taking differently: Take 50 mg by mouth daily at 2 PM. Please schedule appointment for refills. 3rd attempt - [phone] 08/23/16   Troy Sine, MD  levothyroxine (SYNTHROID, LEVOTHROID) 88 MCG tablet Take 88 mcg by mouth daily before breakfast.    [provider]  lisinopril (PRINIVIL,ZESTRIL) 20 MG tablet TAKE 1 TABLET BY MOUTH EVERY DAY 09/18/16   Almyra Deforest, PA  lisinopril-hydrochlorothiazide (PRINZIDE,ZESTORETIC) 20-12.5 MG tablet Take 1 tablet by mouth daily. Need  appointment before anymore refill Patient taking differently: Take 0.5 tablets by mouth daily. Need appointment before anymore refill 08/09/16   Troy Sine, MD  LUTEIN-ZEAXANTHIN PO Take 1 tablet by mouth daily.    [provider]  metoprolol (LOPRESSOR) 50 MG tablet TAKE 2 TABLETS IN THE MORNING AND 1 & 1/2 TABLETS IN THE EVENING Patient taking differently: take 1/2 tablet BID 01/04/16   Troy Sine, MD  Misc Natural Products (LUTEIN 20 PO) Take  1 capsule by mouth daily.    [provider]  Multiple Vitamin (MULTIVITAMIN WITH MINERALS) TABS tablet Take 1 tablet by mouth daily. Contains vitamin C    [provider]  NON FORMULARY CPAP THERAPY    [provider]  omega-3 acid ethyl esters (LOVAZA) 1 G capsule Take 1 g by mouth daily.     [provider]    Family History Family History  Problem Relation Age of Onset  . Heart disease Brother        ischemic  . Hypertension Unknown   . Stroke Unknown     Social History Social History  Substance Use Topics  . Smoking status: Never Smoker  . Smokeless tobacco: Never Used  . Alcohol use No     Allergies   Patient has no known allergies.   Review of Systems Review of Systems ROS reviewed and all are negative for acute change except as noted in the HPI.  Physical Exam Updated Vital Signs BP (!) 155/83   Pulse 90   Temp 98.2 F (36.8 C) (Oral)   Resp 15   SpO2 98%   Physical Exam  Constitutional: She is oriented to person, place, and time. She appears well-developed and well-nourished. No distress.  HENT:  Head: Normocephalic and atraumatic.  Right Ear: Tympanic membrane, external ear and ear canal normal.  Left Ear: Tympanic membrane, external ear and ear canal normal.  Nose: Nose normal.  Mouth/Throat: Uvula is midline, oropharynx is clear and moist and mucous membranes are normal. No trismus in the jaw. No oropharyngeal exudate, posterior oropharyngeal erythema or tonsillar abscesses.  Eyes: Pupils are equal, round, and reactive to light. EOM are normal.  Neck: Normal range of motion. Neck supple. No tracheal deviation present.  Cardiovascular: S1 normal, S2 normal, normal heart sounds, intact distal pulses and normal pulses.  An irregularly irregular rhythm present. Tachycardia present.   Pulmonary/Chest: Effort normal and breath sounds normal. No respiratory distress. She has no decreased breath sounds. She has no wheezes.  She has no rhonchi. She has no rales.  Abdominal: Normal appearance and bowel sounds are normal. There is no tenderness.  Musculoskeletal: Normal range of motion.  Neurological: She is alert and oriented to person, place, and time.  Skin: Skin is warm and dry.  Psychiatric: She has a normal mood and affect. Her speech is normal and behavior is normal. Thought content normal.     ED Treatments / Results  Labs (all labs ordered are listed, but only abnormal results are displayed) Labs Reviewed  BASIC METABOLIC PANEL  CBC  MAGNESIUM  BRAIN NATRIURETIC PEPTIDE  I-STAT TROPONIN, ED    EKG  EKG Interpretation  Date/Time:  Wednesday November 29 2016 16:01:59 EDT Ventricular Rate:  117 PR Interval:    QRS Duration: 102 QT Interval:  335 QTC Calculation: 468 R Axis:   3 Text Interpretation:  Atrial fibrillation Inferoposterior infarct, recent Lateral leads are also involved Atrial fibrillation New since previous tracing Confirmed by Orlie Dakin (  [id number]) on 11/29/2016 4:15:35 PM       Radiology No results found.  Procedures Procedures (including critical care time)  CRITICAL CARE Performed by: Ozella Rocks   Total critical care time: 35 minutes  Critical care time was exclusive of separately billable procedures and treating other patients.  Critical care was necessary to treat or prevent imminent or life-threatening deterioration.  Critical care was time spent personally by me on the following activities: development of treatment plan with patient and/or surrogate as well as nursing, discussions with consultants, evaluation of patient's response to treatment, examination of patient, obtaining history from patient or surrogate, ordering and performing treatments and interventions, ordering and review of laboratory studies, ordering and review of radiographic studies, pulse oximetry and re-evaluation of patient's condition.   Medications Ordered in ED Medications    metoprolol tartrate (LOPRESSOR) injection 5 mg (5 mg Intravenous Given 11/29/16 1651)  metoprolol tartrate (LOPRESSOR) injection 5 mg (5 mg Intravenous Given 11/29/16 1726)  metoprolol tartrate (LOPRESSOR) injection 5 mg (5 mg Intravenous Given 11/29/16 1756)  apixaban (ELIQUIS) tablet 5 mg (5 mg Oral Given 11/29/16 1855)  metoprolol tartrate (LOPRESSOR) tablet 50 mg (50 mg Oral Given 11/29/16 1855)     Initial Impression / Assessment and Plan / ED Course  I have reviewed the triage vital signs and the nursing notes.  Pertinent labs & imaging results that were available during my care of the patient were reviewed by me and considered in my medical decision making (see chart for details).  Final Clinical Impressions(s) / ED Diagnoses  {I have reviewed and evaluated the relevant laboratory values. {I have reviewed and evaluated the relevant imaging studies. {I have interpreted the relevant EKG. {I have reviewed the relevant previous healthcare records.  {I obtained HPI from historian. {Patient discussed with supervising physician.  ED Course:  Assessment: Pt is a 83 y.o. female with a hx of HTN, palpitation, hypothyroidism, hyperlipidemia and obstructive sleep apnea, presents to the Emergency Department today via EMS from dentist office due to HTN with systolic are 376E as well as atrial fibrillation. Pt states this occurred between 1430 and 1500 when she was getting her cavities filled. Notes feeling palpitations in her chest since then. Notes hx of palpitations in past with no diagnosis of atrial fibrillation. Pt does not take blood thinners. Sees Cardiologist for HTN. No N/V. No diaphoresis. No fevers. No cough/congestion. No headaches. No visual changes. On exam, pt in NAD. Nontoxic/nonseptic appearing. VS with tachycardia. Afebrile. Lungs CTA. Abdomen nontender soft. EKG shows Atrial Fibrillation with RVR. Trop negative. Pt refused chest xray. Denies CP/SOB. CBC unremarkable. BMP unremarkable.  ChadVasc 3. Pt does not take blood thinners. This is a new diagnosis. Discussed Cardioversion and pt declined. Consult to Cardiology . Given metoprolol 5mg  IV x 3 at 15 min intervals with no change in rate. Seen by supervising physician. Consulted Dr. Acie Fredrickson who recommended Metoprolol 50mg  BID. Goal HR <120. Start on anticoagulation (Eliquis). Call cardiology office tomorrow for follow up. Plan is to [place] with follow up to Cardiology. At time of discharge, Patient is in no acute distress. Vital Signs are stable. HR between 95-110. Patient is able to ambulate. Patient able to tolerate PO.    Disposition/Plan:  DC Home Additional Verbal discharge instructions given and discussed with patient.  Pt Instructed to f/u with Cardioogy in the next week for evaluation and treatment of symptoms. Return precautions given Pt acknowledges and agrees with plan  Supervising Physician Orlie Dakin, MD  Final diagnoses:  Atrial fibrillation, unspecified type [facility])    New Prescriptions New Prescriptions   No medications on file     Shary Decamp, Hershal Coria 11/29/16 Harlow Mares, MD 11/30/16 [phone]

## 2016-11-29 NOTE — ED Provider Notes (Signed)
Complains of mild dyspnea and feeling ofher heart pounding while sitting in the dental chair2 PM today. No treatment prior to coming here. She reports that she's had similar episodesintermittently for the past 2 or 3 years. She denies anychest pain. On exam alert no distress lungs clear auscultation heart tachycardic irregularly irregular abdomen nondistended nontender extremity without edema. I discussed cardioversion with patient which she declined.Dr Marlou Porch from cardiology service. Patient with me and suggested metoprolol 5 mg IV 3 doses, ED observation   Orlie Dakin, MD 11/30/16 [phone]

## 2016-12-01 ENCOUNTER — Telehealth (HOSPITAL_COMMUNITY): Payer: Self-pay | Admitting: *Deleted

## 2016-12-01 NOTE — Telephone Encounter (Signed)
I cld pt to follow up since ED visit to find out if patient has started the blood thinner Eliquis that they discharged her on and if she has set up a follow up with Dr. Claiborne Billings.  Per the patient she is trying to decide if she wants to pursue this route with her "irregular heartbeat".  She stated she has not started the blood thinner, said she "considers herself a healthnut" she does not think that Dr. Claiborne Billings would need to see her for this since she manages it at home.  She said she will decide whether or not to pursue it in the next few days.  Forwarding note to provider

## 2016-12-04 NOTE — Telephone Encounter (Signed)
Acknowledged.

## 2017-01-08 ENCOUNTER — Other Ambulatory Visit: Payer: Self-pay | Admitting: *Deleted

## 2017-01-08 ENCOUNTER — Telehealth: Payer: Self-pay | Admitting: Cardiovascular Disease

## 2017-01-08 MED ORDER — LISINOPRIL 20 MG PO TABS: ORAL_TABLET | ORAL | 1 refills | Status: DC

## 2017-01-08 NOTE — Telephone Encounter (Signed)
New Message    She is taking this medication 2 x a day   *STAT* If patient is at the pharmacy, call can be transferred to refill team.   1. Which medications need to be refilled? (please list name of each medication and dose if known)  lisinopril (PRINIVIL,ZESTRIL) 20 MG tablet TAKE 1 TABLET BY MOUTH EVERY DAY Patient taking differently: TAKE 10MG  BY MOUTH EVERY MORNING AND 20MG  EVERY EVENING     2. Which pharmacy/location (including street and city if local pharmacy) is medication to be sent to? [facility] on [address]   3. Do they need a 30 day or 90 day supply? [place]

## 2017-01-22 ENCOUNTER — Telehealth: Payer: Self-pay | Admitting: Physician Assistant

## 2017-01-22 NOTE — Telephone Encounter (Signed)
Returned call to patient.She stated for the past 2 to 3 weeks B/P has been elevated in afternoons.Stated she is concerned she is taking too much medication.Appointment scheduled with Almyra Deforest PA 01/24/17 at 11:00 am.Advised to bring a list of B/P readings and all medications to appointment.

## 2017-01-22 NOTE — Telephone Encounter (Signed)
Pt c/o BP issue: STAT if pt c/o blurred vision, one-sided weakness or slurred speech  1. What are your last 5 BP readings? 211/50  Hr  60  2. Are you having any other symptoms (ex. Dizziness, headache, blurred vision, passed out)? None   3. What is your BP issue?  high  Pt said she is overdosing on her medication

## 2017-01-24 ENCOUNTER — Ambulatory Visit: Payer: Medicare Other | Admitting: Physician Assistant

## 2017-01-25 ENCOUNTER — Ambulatory Visit: Payer: Medicare Other | Admitting: Physician Assistant

## 2017-01-25 ENCOUNTER — Encounter: Payer: Self-pay | Admitting: Physician Assistant

## 2017-01-25 VITALS — BP 147/68 | HR 48 | Ht 60.0 in | Wt 189.8 lb

## 2017-01-25 DIAGNOSIS — E785 Hyperlipidemia, unspecified: Secondary | ICD-10-CM | POA: Diagnosis not present

## 2017-01-25 DIAGNOSIS — Z9989 Dependence on other enabling machines and devices: Secondary | ICD-10-CM | POA: Diagnosis not present

## 2017-01-25 DIAGNOSIS — I10 Essential (primary) hypertension: Secondary | ICD-10-CM

## 2017-01-25 DIAGNOSIS — G4733 Obstructive sleep apnea (adult) (pediatric): Secondary | ICD-10-CM | POA: Diagnosis not present

## 2017-01-25 DIAGNOSIS — E039 Hypothyroidism, unspecified: Secondary | ICD-10-CM | POA: Diagnosis not present

## 2017-01-25 DIAGNOSIS — I4891 Unspecified atrial fibrillation: Secondary | ICD-10-CM

## 2017-01-25 NOTE — Patient Instructions (Signed)
Medication Instructions: START Eliquis 5 mg    Labwork: If you start Eliquis, please return for blood work in 2 weeks (after Thanksgiving). At that time, you will need a CBC. We will also check your thyroid at that time--TSH and Free,T4. You can come to our office for lab work. No appointment is necessary. There is a sign in sheet for labs in our lobby.   Follow-Up: Your physician recommends that you schedule a follow-up appointment in: 3 months with Dr. Claiborne Billings (MD ONLY)  If you need a refill on your cardiac medications before your next appointment, please call your pharmacy.

## 2017-01-25 NOTE — Progress Notes (Signed)
Cardiology Office Note    Date:  01/27/2017   ID:  AKESHA URESTI, DOB 04/18/1930, MRN [id number]  PCP:  Deland Pretty, MD  Cardiologist:  Dr. Claiborne Billings   Chief Complaint  Patient presents with  . Follow-up    PAF, seen for Dr. Claiborne Billings    History of Present Illness:  MASIEL GENTZLER is a 83 y.o. female with PMH of HTN, palpitation, hypothyroidism, hyperlipidemia, OSA on CPAP and PAF. She has documented history of PVCs on monitoring in 2015. Last echocardiogram obtained on 04/13/2014 showed EF 60-65%, trivial aortic regurgitation, mildly dilated right atrium. She was last seen in the cardiology office on 08/25/2015 for evaluation of pedal edema. Salt and fluid restriction was recommended. I last saw the patient on 09/05/2016, she was having prolonged tachycardia palpitation after her thyroid medication was increased, however due to lack of recurrence, we did not obtain a 30 day event monitor. She was instructed to contact cardiology if she has any recurrence of palpitation.   She presented to the ED on 11/29/2016 after sent over via EMS from [facility] due to hypertensive urgency with systolic blood pressure of 200s well as new atrial fibrillation. She was not seen by cardiology service during the ED visit, however EDP did talk to on-call cardiologist at that time. She did not want to undergo cardioversion at the time. She was placed on metoprolol 50 mg twice a day and eliquis. She was instructed to follow-up with cardiology service closely as outpatient, however this did not occur until today.  Since then, she has been maintaining sinus rhythm. Last weekend, she did have an episode of prolonged tachyahythmia. She has not been started on eliquis even though she has it at home. I urged her to start on eliquis given recurrence of tachyarrhythmia likely indicating recurrent atrial fibrillation. I did also discussed with the patient regarding pathophysiology of atrial fibrillation, and the risk of not on  systemic anticoagulation. Still, she wished to discuss with her family first before starting. She is aware of benefit and risk of systemic anticoagulation. Given baseline bradycardia in sinus rhythm, I am unable to increase rate control therapy. She continued to have some blood pressure issue, mainly her blood pressure is elevated at night. Most of the time, her blood pressure is maintained in the 130s.   Past Medical History:  Diagnosis Date  . Arthritis   . Brain tumor ([place])   . Essential hypertension    a. 10/2011 Echo: EF >55%; b. 04/2014 Echo: EF 60-65%, triv AI, mildly dil RA.  Marland Kitchen Hypothyroidism   . Meningioma ([place])   . Palpitations    a. 10/2011 Cardionet; b. 02/2014 Cardionet: sinus rhythm, occas unifocal isolated PVC's, no signif brady/tachy.  . Pedal edema   . Sleep apnea 04/22/08   SLEEP STUDY done @ [facility]. Read by Dr. Keturah Barre. Repeat study done 05/22/11 @ [facility].    Past Surgical History:  Procedure Laterality Date  . APPENDECTOMY    . TONSILLECTOMY    . TUBAL LIGATION      Current Medications: Outpatient Medications Prior to Visit  Medication Sig Dispense Refill  . apixaban (ELIQUIS) 5 MG TABS tablet Take 1 tablet (5 mg total) by mouth 2 (two) times daily. 60 tablet 0  . CRANBERRY PO Take 1 capsule by mouth daily.    . hydrALAZINE (APRESOLINE) 50 MG tablet Take 50 mg by mouth.    . levothyroxine (SYNTHROID, LEVOTHROID) 88 MCG tablet Take  88 mcg by mouth daily before breakfast.    . lisinopril (PRINIVIL,ZESTRIL) 20 MG tablet Take 20 mg every evening by mouth.    Marland Kitchen lisinopril-hydrochlorothiazide (PRINZIDE,ZESTORETIC) 20-12.5 MG tablet Take 1 tablet every morning by mouth.    . LUTEIN-ZEAXANTHIN PO Take 1 tablet by mouth daily.    . metoprolol tartrate (LOPRESSOR) 50 MG tablet Take 50 mg 2 (two) times daily by mouth. Patient taking 1.5 tabs in the morning and 2 tabs in the evening    . Multiple Vitamin (MULTIVITAMIN WITH MINERALS) TABS tablet  Take 1 tablet by mouth daily. Contains vitamin C    . NON FORMULARY CPAP THERAPY    . omega-3 acid ethyl esters (LOVAZA) 1 G capsule Take 1 g by mouth daily.     Marland Kitchen lisinopril (PRINIVIL,ZESTRIL) 20 MG tablet TAKE 10MG  BY MOUTH EVERY MORNING AND 20MG  EVERY EVENING 180 tablet 1  . lisinopril-hydrochlorothiazide (PRINZIDE,ZESTORETIC) 20-12.5 MG tablet Take 1 tablet by mouth daily. Need appointment before anymore refill 30 tablet 0  . metoprolol (LOPRESSOR) 50 MG tablet TAKE 2 TABLETS IN THE MORNING AND 1 & 1/2 TABLETS IN THE EVENING (Patient taking differently: TAKE 25MG  BY MOUTH TWICE DAILY) 315 tablet 2  . hydrALAZINE (APRESOLINE) 50 MG tablet Take 1 tablet (50 mg total) by mouth 3 (three) times daily. Please schedule appointment for refills. 3rd attempt - [phone] (Patient taking differently: Take 50 mg by mouth daily at 2 PM. Please schedule appointment for refills. 3rd attempt - [phone]) 30 tablet 0   No facility-administered medications prior to visit.      Allergies:   Other   Social History   Socioeconomic History  . Marital status: Single    Spouse name: None  . Number of children: None  . Years of education: None  . Highest education level: None  Social Needs  . Financial resource strain: None  . Food insecurity - worry: None  . Food insecurity - inability: None  . Transportation needs - medical: None  . Transportation needs - non-medical: None  Occupational History  . None  Tobacco Use  . Smoking status: Never Smoker  . Smokeless tobacco: Never Used  Substance and Sexual Activity  . Alcohol use: No    Alcohol/week: 0.0 oz  . Drug use: No  . Sexual activity: No  Other Topics Concern  . None  Social History Narrative  . None     Family History:  The patient's family history includes Heart disease in her brother; Hypertension in her unknown relative; Stroke in her unknown relative.   ROS:   Please see the history of present illness.    ROS All other systems  reviewed and are negative.   PHYSICAL EXAM:   VS:  BP (!) 147/68   Pulse (!) 48   Ht 5' (1.524 m)   Wt 189 lb 12.8 oz (86.1 kg)   BMI 37.07 kg/m    GEN: Well nourished, well developed, in no acute distress  HEENT: normal  Neck: no JVD, carotid bruits, or masses Cardiac: RRR; no murmurs, rubs, or gallops,no edema  Respiratory:  clear to auscultation bilaterally, normal work of breathing GI: soft, nontender, nondistended, + BS MS: no deformity or atrophy  Skin: warm and dry, no rash Neuro:  Alert and Oriented x 3, Strength and sensation are intact Psych: euthymic mood, full affect  Wt Readings from Last 3 Encounters:  01/25/17 189 lb 12.8 oz (86.1 kg)  09/05/16 190 lb (86.2 kg)  08/25/15 204 lb  6.4 oz (92.7 kg)      Studies/Labs Reviewed:   EKG:  EKG is ordered today.  The ekg ordered today demonstrates sinus bradycardia, no significant ST-T wave changes, heart rate 50  Recent Labs: 11/29/2016: B Natriuretic Peptide 29.2; BUN 15; Creatinine, Ser 0.78; Hemoglobin 13.0; Magnesium 2.2; Platelets 282; Potassium 4.7; Sodium 137   Lipid Panel    Component Value Date/Time   CHOL 203 (H) 03/26/2014 1023   TRIG 96 03/26/2014 1023   HDL 51 03/26/2014 1023   CHOLHDL 4.0 03/26/2014 1023   VLDL 19 03/26/2014 1023   LDLCALC 133 (H) 03/26/2014 1023    Additional studies/ records that were reviewed today include:   Echo 04/13/2014 LV EF: 60% -  65%  Study Conclusions  - Left ventricle: The cavity size was normal. Wall thickness was normal. Systolic function was normal. The estimated ejection fraction was in the range of 60% to 65%. - Aortic valve: There was trivial regurgitation. - Right atrium: The atrium was mildly dilated.   ASSESSMENT:    1. Atrial fibrillation, unspecified type ([place])   2. Essential hypertension   3. Hypothyroidism, unspecified type   4. Hyperlipidemia, unspecified hyperlipidemia type   5. OSA on CPAP      PLAN:  In order of problems listed  above:  1. Paroxysmal atial fibrillation: She has eliquis at home, however she is not taking them yet. I urged her to started on eliquis. We discussed the benefit and risk of systemic anticoagulation therapy. She has had recurrent tachyarrhythmia. Her CHA2DS2-Vasc score is 68 (age, female, HTN)  2. Hypertension: recently presented to ED with SBP of > 2000, however based on her home blood pressure reading, most of the time, her systolic blood pressure is in the 130s. We'll continue on current medication however she can take her PM BP meds a few hours early  3. Hyperlipidemia: last lipid panel was in January 2016 in [place]. She is not on a statin. Will need to be addressed on followup after repeat lab  4. Hypothyroidism: on Synthroid, managed by primary care provider.  5. OSA on CPAP: will need followup with Dr. Claiborne Billings    Medication Adjustments/Labs and Tests Ordered: Current medicines are reviewed at length with the patient today.  Concerns regarding medicines are outlined above.  Medication changes, Labs and Tests ordered today are listed in the Patient Instructions below. Patient Instructions  Medication Instructions: START Eliquis 5 mg    Labwork: If you start Eliquis, please return for blood work in 2 weeks (after Thanksgiving). At that time, you will need a CBC. We will also check your thyroid at that time--TSH and Free,T4. You can come to our office for lab work. No appointment is necessary. There is a sign in sheet for labs in our lobby.   Follow-Up: Your physician recommends that you schedule a follow-up appointment in: 3 months with Dr. Claiborne Billings (MD ONLY)  If you need a refill on your cardiac medications before your next appointment, please call your pharmacy.     Hilbert Corrigan, [place]  01/27/2017 11:13 AM    [address], [place], [place]  [address] Phone: [phone]; Fax: [phone]

## 2017-01-27 ENCOUNTER — Encounter: Payer: Self-pay | Admitting: Physician Assistant

## 2017-01-29 ENCOUNTER — Encounter: Payer: Self-pay | Admitting: Cardiovascular Disease

## 2017-01-30 DIAGNOSIS — G4733 Obstructive sleep apnea (adult) (pediatric): Secondary | ICD-10-CM | POA: Diagnosis not present

## 2017-02-05 ENCOUNTER — Other Ambulatory Visit: Payer: Self-pay | Admitting: Cardiovascular Disease

## 2017-02-05 NOTE — Telephone Encounter (Signed)
Rx has been sent to the pharmacy electronically. ° °

## 2017-02-09 ENCOUNTER — Other Ambulatory Visit: Payer: Self-pay | Admitting: *Deleted

## 2017-02-09 MED ORDER — LISINOPRIL 20 MG PO TABS: 20.0000 mg | ORAL_TABLET | Freq: Every evening | ORAL | 3 refills | Status: DC

## 2017-02-09 NOTE — Telephone Encounter (Signed)
Pharmacy requests a ninety day rx. 

## 2017-02-27 ENCOUNTER — Other Ambulatory Visit: Payer: Self-pay | Admitting: Cardiovascular Disease

## 2017-03-09 ENCOUNTER — Telehealth: Payer: Self-pay | Admitting: Cardiovascular Disease

## 2017-03-09 NOTE — Telephone Encounter (Signed)
New message    Patient calling with concerns about cpap setting.  Patient is requesting  settings  be changed. States she is waking up all night. Requesting Brianna Reyes at [facility] be called at [phone].  1) What problem are you experiencing? Waking up all during the night. Wakes up to "quivering: in chest, feels weak.  2) Who is your medical equipment company? [employer]   Please route to the sleep study assistant.

## 2017-03-09 NOTE — Telephone Encounter (Signed)
Follow up  Patient is calling back about cpap settings.  Please call.

## 2017-03-09 NOTE — Telephone Encounter (Signed)
Pt calling reporting symptoms - unsure if related to CPAP or not.  Returned her call to discuss, goes to VM. Left msg for patient to call.

## 2017-05-04 ENCOUNTER — Telehealth: Payer: Self-pay | Admitting: *Deleted

## 2017-05-04 DIAGNOSIS — I10 Essential (primary) hypertension: Secondary | ICD-10-CM | POA: Diagnosis not present

## 2017-05-04 DIAGNOSIS — E039 Hypothyroidism, unspecified: Secondary | ICD-10-CM | POA: Diagnosis not present

## 2017-05-04 DIAGNOSIS — I4891 Unspecified atrial fibrillation: Secondary | ICD-10-CM | POA: Diagnosis not present

## 2017-05-04 NOTE — Telephone Encounter (Signed)
Left message for patient if possible to go by [employer] to have her BIPAP  Machine downloaded. If she is not able to go by there, then she needs to bring the card out of her machine so we can try to download it while she is here.

## 2017-05-05 LAB — CBC
HEMATOCRIT: 39.2 % (ref 34.0–46.6)
Hemoglobin: 12.4 g/dL (ref 11.1–15.9)
MCH: 29.4 pg (ref 26.6–33.0)
MCHC: 31.6 g/dL (ref 31.5–35.7)
MCV: 93 fL (ref 79–97)
PLATELETS: 259 10*3/uL (ref 150–379)
RBC: 4.22 x10E6/uL (ref 3.77–5.28)
RDW: 15.3 % (ref 12.3–15.4)
WBC: 5.6 10*3/uL (ref 3.4–10.8)

## 2017-05-05 LAB — T4, FREE: FREE T4: 1.45 ng/dL (ref 0.82–1.77)

## 2017-05-05 LAB — TSH: TSH: 3.4 u[IU]/mL (ref 0.450–4.500)

## 2017-05-07 ENCOUNTER — Ambulatory Visit: Payer: Medicare Other | Admitting: Cardiovascular Disease

## 2017-05-07 ENCOUNTER — Encounter: Payer: Self-pay | Admitting: Cardiovascular Disease

## 2017-05-07 VITALS — BP 138/52 | HR 53 | Ht 60.0 in | Wt 190.0 lb

## 2017-05-07 DIAGNOSIS — Z79899 Other long term (current) drug therapy: Secondary | ICD-10-CM | POA: Diagnosis not present

## 2017-05-07 DIAGNOSIS — G4733 Obstructive sleep apnea (adult) (pediatric): Secondary | ICD-10-CM | POA: Diagnosis not present

## 2017-05-07 DIAGNOSIS — E669 Obesity, unspecified: Secondary | ICD-10-CM

## 2017-05-07 DIAGNOSIS — E785 Hyperlipidemia, unspecified: Secondary | ICD-10-CM | POA: Diagnosis not present

## 2017-05-07 DIAGNOSIS — I10 Essential (primary) hypertension: Secondary | ICD-10-CM | POA: Diagnosis not present

## 2017-05-07 DIAGNOSIS — I480 Paroxysmal atrial fibrillation: Secondary | ICD-10-CM

## 2017-05-07 MED ORDER — HYDRALAZINE HCL 25 MG PO TABS: 25.0000 mg | ORAL_TABLET | Freq: Three times a day (TID) | ORAL | 3 refills | Status: DC

## 2017-05-07 MED ORDER — METOPROLOL SUCCINATE ER 100 MG PO TB24: 100.0000 mg | ORAL_TABLET | Freq: Every day | ORAL | 3 refills | Status: DC

## 2017-05-07 MED ORDER — ASPIRIN EC 81 MG PO TBEC: 81.0000 mg | DELAYED_RELEASE_TABLET | Freq: Every day | ORAL | 3 refills | Status: DC

## 2017-05-07 NOTE — Patient Instructions (Addendum)
Medication Instructions:  STOP metoprolol tartrate  START metoprolol succinate (Toprol XL) 100 mg daily in the morning  STOP hydralazine 50 mg START hydralazine 25 mg three times daily (every 8 hours)  Continue the lisinopril-HCTZ in the AM and lisinopril in the PM  START Aspirin 81 mg daily  Labwork: Please return for FASTING labs (CMET,Lipid)-lab orders provided  Follow-Up: 2 months with Almyra Deforest PA  Your physician wants you to follow-up in: 6 months with Dr. Claiborne Billings. You will receive a reminder letter in the mail two months in advance. If you don't receive a letter, please call our office to schedule the follow-up appointment.   Any Other Special Instructions Will Be Listed Below (If Applicable).     If you need a refill on your cardiac medications before your next appointment, please call your pharmacy.

## 2017-05-07 NOTE — Progress Notes (Addendum)
Patient ID: Brianna Reyes, female   DOB: [birth year], 84 y.o.   MRN: [id number]     HPI: Brianna Reyes is a 84 y.o. female who presents to the office today for three-year cardiology/sleep followup evaluation.  She was last seen by Almyra Deforest, [facility] in November 2018  Brianna Reyes has a history of hypertension, palpitations, hypothyroidism, hyperlipidemia, as well as complex sleep apnea. She has documented mild LVH with grade 2 diastolic dysfunction. She does a history of previous  labile blood pressure at home a bit in the past.  These have ranged from 710 to 626 systolically.  Recently, her medications have been titrated says she now is taking metoprolol 75 mg twice a day, hydralazine, 50 mg 3 times a day, and she takes a total dose of lisinopril 40 mg and HCTZ 12.5 mg daily.    She was seen in the office by Lesia Hausen on 02/26/2014 with complaints of increasing palpitations.  A CardioNet monitor was performed.  I reviewed this, which recorded her rhythm from the summer 17 2015 through 03/11/2014.  She had sinus rhythm.  There were occasional unifocal isolated PVCs.  There were no episodes of significant bradycardia or tachycardia.  She denies recent chest pain.  When I last saw her in January 2016, I increased her beta blocker therapy.  2.  Metoprolol 100 mg in the morning and 75 mg in the evening.  This has resulted in significant improvement in her palpitations.  In light of her cardiac murmur.  I recommended a three-year follow-up echo Doppler study.  This was done on 04/13/2014 and showed normal systolic function with an EF of 60-65%.  Aortic valve was mildly thickened but there was no stenosis.  She had trivial aortic and mitral insufficiency.  Laboratory in January revealed a normal hemoglobin and hematocrit.  She has normal renal function.  She has complex sleep apnea and has required BiPAP therapy or BiPAP pressure of 22 and an EPAP pressure of 17. She uses it 100% of the time.  She feels that sleep is  sleeping much better.  She is also concerned that she may at times.  Still be stopping breathing.  When I saw her in January.  She could not recall recall when her last time her unit was downloaded.  A download was obtained from March 9 through 06/18/2014.  This showed 100% compliance.  She was sleeping 9 hours and 27 minutes at night.  Her average device IPAP pressure greater than 90% of the time was 18.9 with a maximum titrated IPAP pressure of 23.7.  Her maximum titrated EPAP pressure was 21.1.  Her average device CPAP pressure was 16.8.  AHI was still increased at 16.1.  Of note, her EPAP starting pressure was 10.  She was continuing to have obstructive apneas, occasional hypotony is, and has been demonstrated to have central apneas.  We had recommended an adaptive servo ventilation titration but she did not pursue this and initially wanted adjustment to her current BiPAP unit.  I have not seen her since 2016.  She has continued to use BiPAP auto.  I interrogated her machine, which reveals a minimum EPAP pressure of 12 with maximum IPAP up to 25 CWP.  She is on has a bi-Flex of 3.  Over the past 30 days.  She has been averaging 7 hours and 24 minutes of sleep.  Her AHI was 6.2.  95th percent pressure was 15.4/13.2.  Her major complaint today is that of  significant blood pressure lability.  At times she notes her blood pressure peaking very high in the latter portion of the day that time, she is recorded blood pressures over 200.  However, at other times her blood pressure may be low.  She apparently is been taking lisinopril HCT 20/12.5 and a morning and lisinopril 20, now grams in the evening.  She takes hydralazine 50 mg once a day and has been taking 75 mg of metoprolol in the morning and 100 mg in the evening.  Has a history of PAF and presented to the emergency room in September 2018.  She presented to her dentist office with hypertensive urgency a new atrial fibrillation.  His treated with metoprolol  and eliquis was recommended.  However, the patient states she never started taking the eliquis.  She has not been taking any aspirin.  She last saw Almyra Deforest, [facility]  in November 2018 who presents for evaluation.   Past Medical History:  Diagnosis Date  . Arthritis   . Brain tumor ([place])   . Essential hypertension    a. 10/2011 Echo: EF >55%; b. 04/2014 Echo: EF 60-65%, triv AI, mildly dil RA.  Marland Kitchen Hypothyroidism   . Meningioma ([place])   . Palpitations    a. 10/2011 Cardionet; b. 02/2014 Cardionet: sinus rhythm, occas unifocal isolated PVC's, no signif brady/tachy.  . Pedal edema   . Sleep apnea 04/22/08   SLEEP STUDY done @ [facility]. Read by Dr. Keturah Barre. Repeat study done 05/22/11 @ [facility].    Past Surgical History:  Procedure Laterality Date  . APPENDECTOMY    . TONSILLECTOMY    . TUBAL LIGATION      Allergies  Allergen Reactions  . Other Other (See Comments)    CORTISONE INJECTION; severe dizziness and made her very sick; pt states "it about killed me"    Current Outpatient Medications  Medication Sig Dispense Refill  . CRANBERRY PO Take 1 capsule by mouth daily.    . hydrALAZINE (APRESOLINE) 25 MG tablet Take 1 tablet (25 mg total) by mouth 3 (three) times daily. 360 tablet 3  . levothyroxine (SYNTHROID, LEVOTHROID) 88 MCG tablet Take 88 mcg by mouth daily before breakfast.    . lisinopril (PRINIVIL,ZESTRIL) 20 MG tablet Take 1 tablet (20 mg total) by mouth every evening. 90 tablet 3  . lisinopril-hydrochlorothiazide (PRINZIDE,ZESTORETIC) 20-12.5 MG tablet Take 1 tablet by mouth daily. 90 tablet 2  . LUTEIN-ZEAXANTHIN PO Take 1 tablet by mouth daily.    . Multiple Vitamin (MULTIVITAMIN WITH MINERALS) TABS tablet Take 1 tablet by mouth daily. Contains vitamin C    . NON FORMULARY CPAP THERAPY    . omega-3 acid ethyl esters (LOVAZA) 1 G capsule Take 1 g by mouth daily.     Marland Kitchen aspirin EC 81 MG tablet Take 1 tablet (81 mg total) by mouth daily. 90 tablet 3    . metoprolol succinate (TOPROL-XL) 100 MG 24 hr tablet Take 1 tablet (100 mg total) by mouth daily. Take with or immediately following a meal. 90 tablet 3   No current facility-administered medications for this visit.     Social History   Socioeconomic History  . Marital status: Single    Spouse name: Not on file  . Number of children: Not on file  . Years of education: Not on file  . Highest education level: Not on file  Social Needs  . Financial resource strain: Not on file  . Food insecurity -  worry: Not on file  . Food insecurity - inability: Not on file  . Transportation needs - medical: Not on file  . Transportation needs - non-medical: Not on file  Occupational History  . Not on file  Tobacco Use  . Smoking status: Never Smoker  . Smokeless tobacco: Never Used  Substance and Sexual Activity  . Alcohol use: No    Alcohol/week: 0.0 oz  . Drug use: No  . Sexual activity: No  Other Topics Concern  . Not on file  Social History Narrative  . Not on file    Family History  Problem Relation Age of Onset  . Heart disease Brother        ischemic  . Hypertension Unknown   . Stroke Unknown    ROS General: Negative; No fevers, chills, or night sweats;  HEENT: Negative; No changes in vision or hearing, sinus congestion, difficulty swallowing Pulmonary: Negative; No cough, wheezing, shortness of breath, hemoptysis Cardiovascular: see HPI GI: Negative; No nausea, vomiting, diarrhea, or abdominal pain GU: Negative; No dysuria, hematuria, or difficulty voiding Musculoskeletal: Negative; no myalgias, joint pain, or weakness Hematologic/Oncology: Positive for hypothyroidism; no easy bruising, bleeding Endocrine: Negative; no heat/cold intolerance Neuro: Negative; no changes in balance, headaches Skin: Negative; No rashes or skin lesions Psychiatric: Negative; No behavioral problems, depression Sleep: Positive for complex, sleep apnea, currently on BiPAP therapy.  She is  unaware of any breakthrough  snoring, daytime sleepiness, hypersomnolence, bruxism, restless legs, hypnogognic hallucinations, no cataplexy Other comprehensive 14 point system review is negative   Physical Exam BP (!) 138/52   Pulse (!) 53   Ht 5' (1.524 m)   Wt 190 lb (86.2 kg)   BMI 37.11 kg/m    Repeat blood pressure by me was 140/62  Wt Readings from Last 3 Encounters:  05/07/17 190 lb (86.2 kg)  01/25/17 189 lb 12.8 oz (86.1 kg)  09/05/16 190 lb (86.2 kg)   General: Alert, oriented, no distress.  Skin: normal turgor, no rashes, warm and dry HEENT: Normocephalic, atraumatic. Pupils equal round and reactive to light; sclera anicteric; extraocular muscles intact;  Nose without nasal septal hypertrophy Mouth/Parynx benign; Mallinpatti scale 3 Neck: No JVD, no carotid bruits; normal carotid upstroke Lungs: clear to ausculatation and percussion; no wheezing or rales Chest wall: without tenderness to palpitation Heart: PMI not displaced, RRR, s1 s2 normal, 1/6 systolic murmur, no diastolic murmur, no rubs, gallops, thrills, or heaves Abdomen: Obese; soft, nontender; no hepatosplenomehaly, BS+; abdominal aorta nontender and not dilated by palpation. Back: no CVA tenderness Pulses 2+ Musculoskeletal: full range of motion, normal strength, no joint deformities Extremities: no clubbing cyanosis or edema, Homan's sign negative  Neurologic: grossly nonfocal; Cranial nerves grossly wnl Psychologic: Normal mood and affect   ECG (independently read by me): Sinus rhythm at 53 bpm.  ECG (independently read by me): Sinus bradycardia 51 bpm.  No ectopy.  Normal intervals.  January 2016 ECG (independently read by me): Normal sinus rhythm at 62 bpm.  No ectopy.  QTc interval 414 ms.  May 2015 ECG (independently read by me): sinus bradycardia 57 beats per minute.  No ectopy.  Prior ECG : Sinus rhythm at 68 beats per minute. No ectopy per  LABS:   BMP Latest Ref Rng & Units 11/29/2016  04/05/2014 03/26/2014  Glucose 65 - 99 mg/dL 94 107(H) 84  BUN 6 - 20 mg/dL _0 Creatinine 0.44 - 1.00 mg/dL 0.78 0.81 0.83  Sodium 135 - 145 mmol/L 137  139 138  Potassium 3.5 - 5.1 mmol/L 4.7 3.6 4.1  Chloride 101 - 111 mmol/L 101 101 100  CO2 22 - 32 mmol/L 25 26 32  Calcium 8.9 - 10.3 mg/dL 10.0 10.0 10.0    Hepatic Function Latest Ref Rng & Units 03/26/2014 07/24/2013 09/18/2012  Total Protein 6.0 - 8.3 g/dL 6.8 6.9 7.3  Albumin 3.5 - 5.2 g/dL 3.8 4.2 3.6  AST 0 - 37 U/L _0 ALT 0 - 35 U/L _1 Alk Phosphatase 39 - 117 U/L 59 66 76  Total Bilirubin 0.2 - 1.2 mg/dL 0.6 0.5 0.6    CBC Latest Ref Rng & Units 05/04/2017 11/29/2016 04/05/2014  WBC 3.4 - 10.8 x10E3/uL 5.6 6.2 6.9  Hemoglobin 11.1 - 15.9 g/dL 12.4 13.0 13.5  Hematocrit 34.0 - 46.6 % 39.2 40.9 40.4  Platelets 150 - 379 x10E3/uL 259 282 243   Lab Results  Component Value Date   TSH 3.400 05/04/2017   BNP No results found for: PROBNP   Lipid Panel     Component Value Date/Time   CHOL 203 (H) 03/26/2014 1023   TRIG 96 03/26/2014 1023   HDL 51 03/26/2014 1023   CHOLHDL 4.0 03/26/2014 1023   VLDL 19 03/26/2014 1023   LDLCALC 133 (H) 03/26/2014 1023     RADIOLOGY: No results found.  IMPRESSION:  1. Essential hypertension   2. Hyperlipidemia, unspecified hyperlipidemia type   3. OSA (obstructive sleep apnea)   4. Medication management   5. Obesity (BMI 30-39.9)   6. Paroxysmal atrial fibrillation (HCC)     ASSESSMENT AND PLAN: Brianna Reyes is an 84 year old female with a history of labile hypertension,  palpitations, hyperlipidemia, obesity, and complex sleep apnea.  An echo Doppler study in 2013 showed normal systolic function with suggestive of grade 2 diastolic dysfunction. She had moderate LA dilatation, mitral annular calcification,aortic valve sclerosis with very mild stenosis.  Her blood pressure today is controlled.  Her palpitations have improved with titration of her metoprolol to  100 mg in the morning and 75 mg in the evening.  .  She has continued to have difficulty with blood pressure lability.  This is significant increase in her blood pressure every night.  She continues to use BiPAP therapy and admits to 100% compliance.  She had recently developed an episode of paroxysmal atrial fibrillation.  She was advised to take eliquis, but never started this.  Her ECG today shows sinus bradycardia.  I have recommended adjustment to her medical regimen.  Rather than have her take 3 1/2 pills of metoprolol 50 mg a day  I have suggested changing her to metoprolol succinate 100 mg in the morning.  She also has been taking hydralazine 50 mg 1 time a day and I have suggested she take 25 mg every 8 hours.  She will continue her current regimen of lisinopril HCT 20/12 point 5 in the morning and lisinopril 20 mg in the evening.  His blood pressure medication adjustment should allow for more stability and prevent significant peaks and troughs of her most recent blood pressure recordings.  She will monitor her blood pressure.  I again discussed atrial fibrillation and her elevated chads score.  She is adamant that she does not want to initiate anticoagulation.  I suggested that she initiate aspirin 81 mg daily.  She will have a follow-up office visit with Almyra Deforest, PAC in 2 months and I will see her in 4-6 months for  reevaluation.  Time spent: 40 minutes  Troy Sine, MD, [facility]  05/09/2017 8:30 AM

## 2017-05-09 ENCOUNTER — Encounter: Payer: Self-pay | Admitting: Cardiovascular Disease

## 2017-05-09 NOTE — Addendum Note (Signed)
Addended by: Shelva Majestic A on: 05/09/2017 08:32 AM   Modules accepted: Level of Service

## 2017-07-03 ENCOUNTER — Other Ambulatory Visit: Payer: Self-pay | Admitting: Physician Assistant

## 2017-07-03 DIAGNOSIS — E785 Hyperlipidemia, unspecified: Secondary | ICD-10-CM | POA: Diagnosis not present

## 2017-07-03 DIAGNOSIS — I10 Essential (primary) hypertension: Secondary | ICD-10-CM | POA: Diagnosis not present

## 2017-07-03 DIAGNOSIS — Z79899 Other long term (current) drug therapy: Secondary | ICD-10-CM | POA: Diagnosis not present

## 2017-07-03 LAB — COMPREHENSIVE METABOLIC PANEL
ALT: 17 IU/L (ref 0–32)
AST: 26 IU/L (ref 0–40)
Albumin/Globulin Ratio: 1.8 (ref 1.2–2.2)
Albumin: 4.5 g/dL (ref 3.5–4.7)
Alkaline Phosphatase: 71 IU/L (ref 39–117)
BUN/Creatinine Ratio: 25 (ref 12–28)
BUN: 19 mg/dL (ref 8–27)
Bilirubin Total: 0.4 mg/dL (ref 0.0–1.2)
CALCIUM: 10.1 mg/dL (ref 8.7–10.3)
CO2: 26 mmol/L (ref 20–29)
CREATININE: 0.77 mg/dL (ref 0.57–1.00)
Chloride: 98 mmol/L (ref 96–106)
GFR, EST AFRICAN AMERICAN: 81 mL/min/{1.73_m2} (ref >59)
GFR, EST NON AFRICAN AMERICAN: 71 mL/min/{1.73_m2} (ref >59)
GLUCOSE: 91 mg/dL (ref 65–99)
Globulin, Total: 2.5 g/dL (ref 1.5–4.5)
Potassium: 4.3 mmol/L (ref 3.5–5.2)
Sodium: 139 mmol/L (ref 134–144)
TOTAL PROTEIN: 7 g/dL (ref 6.0–8.5)

## 2017-07-03 LAB — LIPID PANEL
CHOL/HDL RATIO: 3.6 ratio (ref 0.0–4.4)
Cholesterol, Total: 233 mg/dL — ABNORMAL HIGH (ref 100–199)
HDL: 65 mg/dL (ref >39)
LDL CALC: 151 mg/dL — AB (ref 0–99)
Triglycerides: 83 mg/dL (ref 0–149)
VLDL Cholesterol Cal: 17 mg/dL (ref 5–40)

## 2017-07-03 NOTE — Telephone Encounter (Signed)
REFIL 

## 2017-07-03 NOTE — Telephone Encounter (Signed)
Please review for refill, Thanks !  

## 2017-07-05 ENCOUNTER — Encounter: Payer: Self-pay | Admitting: Physician Assistant

## 2017-07-05 ENCOUNTER — Ambulatory Visit: Payer: Medicare Other | Admitting: Physician Assistant

## 2017-07-05 VITALS — BP 135/70 | HR 57 | Ht 60.0 in | Wt 192.8 lb

## 2017-07-05 DIAGNOSIS — G4733 Obstructive sleep apnea (adult) (pediatric): Secondary | ICD-10-CM | POA: Diagnosis not present

## 2017-07-05 DIAGNOSIS — R0789 Other chest pain: Secondary | ICD-10-CM

## 2017-07-05 DIAGNOSIS — I10 Essential (primary) hypertension: Secondary | ICD-10-CM

## 2017-07-05 DIAGNOSIS — E039 Hypothyroidism, unspecified: Secondary | ICD-10-CM

## 2017-07-05 DIAGNOSIS — E785 Hyperlipidemia, unspecified: Secondary | ICD-10-CM

## 2017-07-05 DIAGNOSIS — I480 Paroxysmal atrial fibrillation: Secondary | ICD-10-CM

## 2017-07-05 DIAGNOSIS — Z9989 Dependence on other enabling machines and devices: Secondary | ICD-10-CM

## 2017-07-05 MED ORDER — NITROGLYCERIN 0.4 MG SL SUBL: 0.4000 mg | SUBLINGUAL_TABLET | SUBLINGUAL | 3 refills | Status: AC | PRN

## 2017-07-05 NOTE — Progress Notes (Signed)
Cardiology Office Note    Date:  07/07/2017   ID:  Brianna Reyes, DOB [birth year], MRN [id number]  PCP:  Brianna Pretty, MD  Cardiologist:  Dr. Claiborne Billings   Chief Complaint  Patient presents with  . Follow-up    pt does state having chest pains on her right side, pt denies SOB, swelling in hands and feet     History of Present Illness:  Brianna Reyes is a 84 y.o. female with PMH of HTN, HLD, palpitation,hypothyroidism, OSA on CPAP and PAF. She has documented history of PVCs on monitoring in 2015. Last echocardiogram obtained on 04/13/2014 showed EF 60-65%, trivial aortic regurgitation, mildly dilated right atrium. She was last seen in the cardiology office on 08/25/2015 for evaluation of pedal edema. Salt and fluid restriction was recommended. I saw the patient on 09/05/2016, she was having prolonged tachycardia palpitation after her thyroid medication was increased, however due to lack of recurrence, we did not obtain a 30 day event monitor. She was instructed to contact cardiology if she has any recurrence of palpitation.   She presented to the ED on 11/29/2016 after sent over via EMS from [facility] due to hypertensive urgency with systolic blood pressure of 200s well as new atrial fibrillation. She was not seen by cardiology service during the ED visit, however EDP did talk to on-call cardiologist at that time. She did not want to undergo cardioversion at the time. She was placed on metoprolol 50 mg twice a day and eliquis. She was instructed to follow-up with cardiology service closely as outpatient, however this did not occur until today.  I last saw the patient in November 2018, she was maintaining sinus rhythm at the time.  I recommended starting Eliquis, however she wished to discuss with her family first.  She later saw Dr. Claiborne Billings on 05/07/2017, she had again refused to start on systemic anticoagulation.  However she is willing to use 81 mg aspirin.  Patient presents today for cardiology  office visit, it appears she still has not started on the aspirin yet.  I asked her to start on the 81 mg daily of aspirin.  Her LDL is overall 150, LDL goal is less than 100 in this patient, however she is adamant she does not want to start on any statins.  We further discussed the potential benefit of systemic anticoagulation again, she is also adamant she does not want to do that.  She does not have any lower extremity edema, orthopnea or PND.  She has been having some occasional right-sided chest pain that occurs once every several weeks, last episode was on Sunday.  According to her son, she had a large bowl of soup in the it was felt and may be indigestion.  She says this is not the first time she has had this chest discomfort, it does not occur with exertion.  I recommended a Lexiscan stress test, however she does not want to do.  She does not want any radiation, therefore coronary CT is not an option either.  She has agreed to undergo echocardiogram, if EF is normal, then no further work-up is needed and medical therapy will be used.  I have given her prescription for sublingual nitroglycerin to take as needed, she will need to seek medical attention if she has recurrent chest pain lasting hours at a time.   Past Medical History:  Diagnosis Date  . Arthritis   . Brain tumor ([place])   . Essential hypertension  a. 10/2011 Echo: EF >55%; b. 04/2014 Echo: EF 60-65%, triv AI, mildly dil RA.  Marland Kitchen Hypothyroidism   . Meningioma ([place])   . Palpitations    a. 10/2011 Cardionet; b. 02/2014 Cardionet: sinus rhythm, occas unifocal isolated PVC's, no signif brady/tachy.  . Pedal edema   . Sleep apnea 04/22/08   SLEEP STUDY done @ [facility]. Read by Dr. Keturah Barre. Repeat study done 05/22/11 @ [facility].    Past Surgical History:  Procedure Laterality Date  . APPENDECTOMY    . TONSILLECTOMY    . TUBAL LIGATION      Current Medications: Outpatient Medications Prior to Visit    Medication Sig Dispense Refill  . aspirin EC 81 MG tablet Take 1 tablet (81 mg total) by mouth daily. 90 tablet 3  . CRANBERRY PO Take 1 capsule by mouth daily.    . hydrALAZINE (APRESOLINE) 25 MG tablet Take 1 tablet (25 mg total) by mouth 3 (three) times daily. 360 tablet 3  . levothyroxine (SYNTHROID, LEVOTHROID) 88 MCG tablet Take 88 mcg by mouth daily before breakfast.    . lisinopril (PRINIVIL,ZESTRIL) 20 MG tablet TAKE 1/2 TABLET IN THE MORNING AND 1 TABLET IN THE EVENING 135 tablet 1  . lisinopril-hydrochlorothiazide (PRINZIDE,ZESTORETIC) 20-12.5 MG tablet Take 1 tablet by mouth daily. 90 tablet 2  . LUTEIN-ZEAXANTHIN PO Take 1 tablet by mouth daily.    Marland Kitchen MAGNESIUM PO Take 500 mg by mouth.    . metoprolol succinate (TOPROL-XL) 100 MG 24 hr tablet Take 1 tablet (100 mg total) by mouth daily. Take with or immediately following a meal. 90 tablet 3  . Multiple Vitamin (MULTIVITAMIN WITH MINERALS) TABS tablet Take 1 tablet by mouth daily. Contains vitamin C    . NON FORMULARY CPAP THERAPY    . omega-3 acid ethyl esters (LOVAZA) 1 G capsule Take 1 g by mouth daily.      No facility-administered medications prior to visit.      Allergies:   Other   Social History   Socioeconomic History  . Marital status: Single    Spouse name: Not on file  . Number of children: Not on file  . Years of education: Not on file  . Highest education level: Not on file  Occupational History  . Not on file  Social Needs  . Financial resource strain: Not on file  . Food insecurity:    Worry: Not on file    Inability: Not on file  . Transportation needs:    Medical: Not on file    Non-medical: Not on file  Tobacco Use  . Smoking status: Never Smoker  . Smokeless tobacco: Never Used  Substance and Sexual Activity  . Alcohol use: No    Alcohol/week: 0.0 oz  . Drug use: No  . Sexual activity: Never  Lifestyle  . Physical activity:    Days per week: Not on file    Minutes per session: Not on  file  . Stress: Not on file  Relationships  . Social connections:    Talks on phone: Not on file    Gets together: Not on file    Attends religious service: Not on file    Active member of club or organization: Not on file    Attends meetings of clubs or organizations: Not on file    Relationship status: Not on file  Other Topics Concern  . Not on file  Social History Narrative  . Not on file  Family History:  The patient's family history includes Heart disease in her brother; Hypertension in her unknown relative; Stroke in her unknown relative.   ROS:   Please see the history of present illness.    ROS All other systems reviewed and are negative.   PHYSICAL EXAM:   VS:  BP 135/70 (BP Location: Left Arm)   Pulse (!) 57   Ht 5' (1.524 m)   Wt 192 lb 12.8 oz (87.5 kg)   BMI 37.65 kg/m    GEN: Well nourished, well developed, in no acute distress  HEENT: normal  Neck: no JVD, carotid bruits, or masses Cardiac: RRR; no murmurs, rubs, or gallops,no edema  Respiratory:  clear to auscultation bilaterally, normal work of breathing GI: soft, nontender, nondistended, + BS MS: no deformity or atrophy  Skin: warm and dry, no rash Neuro:  Alert and Oriented x 3, Strength and sensation are intact Psych: euthymic mood, full affect  Wt Readings from Last 3 Encounters:  07/05/17 192 lb 12.8 oz (87.5 kg)  05/07/17 190 lb (86.2 kg)  01/25/17 189 lb 12.8 oz (86.1 kg)      Studies/Labs Reviewed:   EKG:  EKG is not ordered today.    Recent Labs: 11/29/2016: B Natriuretic Peptide 29.2; Magnesium 2.2 05/04/2017: Hemoglobin 12.4; Platelets 259; TSH 3.400 07/03/2017: ALT 17; BUN 19; Creatinine, Ser 0.77; Potassium 4.3; Sodium 139   Lipid Panel    Component Value Date/Time   CHOL 233 (H) 07/03/2017 1137   TRIG 83 07/03/2017 1137   HDL 65 07/03/2017 1137   CHOLHDL 3.6 07/03/2017 1137   CHOLHDL 4.0 03/26/2014 1023   VLDL 19 03/26/2014 1023   LDLCALC 151 (H) 07/03/2017 1137     Additional studies/ records that were reviewed today include:   Echo 04/13/2014 LV EF: 60% -  65% Study Conclusions  - Left ventricle: The cavity size was normal. Wall thickness was normal. Systolic function was normal. The estimated ejection fraction was in the range of 60% to 65%. - Aortic valve: There was trivial regurgitation. - Right atrium: The atrium was mildly dilated.    ASSESSMENT:    1. Atypical chest pain   2. Essential hypertension   3. Hyperlipidemia, unspecified hyperlipidemia type   4. Hypothyroidism, unspecified type   5. OSA on CPAP   6. PAF (paroxysmal atrial fibrillation) (HCC)      PLAN:  In order of problems listed above:  1. Atypical chest pain: I initially recommended a Lexiscan Myoview, she does not want to do.  She also does not want a radiation therapy, therefore coronary CT is not option.  I eventually recommend an echocardiogram to assess ejection fraction, if EF is normal, will try medical therapy.  2. PAF: Emphasized on compliance with aspirin 81 mg daily.  She has not been very compliant with it.  She is aware that aspirin will not completely prevent stroke, she is adamant that she does not want to try systemic anticoagulation therapy.  Will continue Toprol.  3. Hypertension: Blood pressure stable.  Heart rate bradycardic in the 50s.  Unable to uptitrate beta-blocker.  4. Hyperlipidemia: Her last LDL is 151 in April 2019.  I recommended starting statin therapy, she does not wish to do statin either.  5. Hypothyroidism: on Synthroid.    Medication Adjustments/Labs and Tests Ordered: Current medicines are reviewed at length with the patient today.  Concerns regarding medicines are outlined above.  Medication changes, Labs and Tests ordered today are listed in the Patient  Instructions below. Patient Instructions  Medication Instructions:  Change Metoprolol to PM Start Nitroglycerin as needed for chest pains.   If you need a refill  on your cardiac medications before your next appointment, please call your pharmacy.  Labwork: None ordered.  Testing/Procedures: Your physician has requested that you have an echocardiogram. Echocardiography is a painless test that uses sound waves to create images of your heart. It provides your doctor with information about the size and shape of your heart and how well your heart's chambers and valves are working. This procedure takes approximately one hour. There are no restrictions for this procedure.   Follow-Up: Your physician wants you to follow-up in: 3-4 months with Dr.Kelly.   Thank you for choosing [facility] at [facility], [place], [place]  07/07/2017 9:39 PM    [place], [place],   [address] Phone: [phone]; Fax: [phone]

## 2017-07-05 NOTE — Patient Instructions (Signed)
Medication Instructions:  Change Metoprolol to PM Start Nitroglycerin as needed for chest pains.   If you need a refill on your cardiac medications before your next appointment, please call your pharmacy.  Labwork: None ordered.  Testing/Procedures: Your physician has requested that you have an echocardiogram. Echocardiography is a painless test that uses sound waves to create images of your heart. It provides your doctor with information about the size and shape of your heart and how well your heart's chambers and valves are working. This procedure takes approximately one hour. There are no restrictions for this procedure.   Follow-Up: Your physician wants you to follow-up in: 3-4 months with Dr.Kelly.   Thank you for choosing [facility] at [facility]!!

## 2017-07-07 ENCOUNTER — Encounter: Payer: Self-pay | Admitting: Physician Assistant

## 2017-07-09 ENCOUNTER — Other Ambulatory Visit (HOSPITAL_COMMUNITY): Payer: Medicare Other

## 2017-07-17 ENCOUNTER — Telehealth: Payer: Self-pay | Admitting: Cardiovascular Disease

## 2017-07-17 DIAGNOSIS — E785 Hyperlipidemia, unspecified: Secondary | ICD-10-CM

## 2017-07-17 DIAGNOSIS — Z79899 Other long term (current) drug therapy: Secondary | ICD-10-CM

## 2017-07-17 NOTE — Telephone Encounter (Signed)
Notes recorded by Troy Sine, MD on 07/15/2017 at 6:04 PM EDT Chemistry normal. Lipids increased; LDL now 151. Recommend initiation of rosuvastatin 20 mg.     Patient aware of results and recommendations.   She does not want to start medication.  She would like to try diet changes and reevaluate at Sept OV.     Patient also reports increased episodes of Afib at night.  She states this only occurs at night and she has to drink ice water in order to stop it.    She is wondering if her CPAP pressures need to be changed.   She states this is making her very tired.    She is taking her metoprolol at night and this has not helped.     Offered appointment for tomorrow with Dr. Claiborne Billings.   She refused and would like to get a download for her CPAP first.    She is aware she is at risk for stroke given she is not on anticoagulation (this was discussed at [facility] 4/25).     Routed to sleep coordinator to assist with download.     Also routed to Dr. Claiborne Billings to make aware.

## 2017-07-17 NOTE — Telephone Encounter (Signed)
Left message to call back  

## 2017-07-17 NOTE — Telephone Encounter (Signed)
New message ° °Pt verbalized that she is returning call for RN °

## 2017-07-17 NOTE — Telephone Encounter (Signed)
She does not want to take a statin, consider a trial of Zetia which should be well-tolerated.  If she refuses, then she refuses and we will just recheck labs in September.  Obtain a download so we can make certain her CPAP is adjusted appropriately

## 2017-07-18 NOTE — Telephone Encounter (Signed)
Called patient to inform her that we need for her to take the sims card from her CPAP machine to [facility] so that we can get a download. She states that she will have her son to take it for her. I told her that once this has been done we will be able to tell her whether or not her pressures will need adjusting. Her afib symptoms may or may not be due to having inadequate CPAP pressures. Once this has been done we will contact her with further recommendations. Patient voiced understanding.

## 2017-07-23 ENCOUNTER — Encounter: Payer: Self-pay | Admitting: Cardiovascular Disease

## 2017-07-30 ENCOUNTER — Telehealth: Payer: Self-pay | Admitting: *Deleted

## 2017-07-30 NOTE — Telephone Encounter (Signed)
Dr. Claiborne Billings ordered BIPAP pressure change based on recent download. Order faxed to [facility].

## 2017-07-31 NOTE — Telephone Encounter (Signed)
Order placed for repeat-will mail lab orders in July

## 2017-07-31 NOTE — Addendum Note (Signed)
Addended by: Patria Mane A on: 07/31/2017 02:42 PM   Modules accepted: Orders

## 2017-08-08 ENCOUNTER — Telehealth: Payer: Self-pay | Admitting: Cardiovascular Disease

## 2017-08-08 NOTE — Telephone Encounter (Signed)
For the past 3 nights, patient said she wakes up all through the night with a-fib symptoms and is using cpap nightly. Patient said she stops breathing and it causes her to go in a-fib with heart fluttering. Patient c/o not getting enough rest through the night and only slept 3 hours all night long last night. She eats ice and her heart goes back into rhythm, afterwards she goes back to sleep, and the a-fib problem reoccurs after she stops breathing. During the night, BP was 201/78 & HR 64 & 192/72 HR 59. Pressure on cpap was adjusted 2-3 days ago and patient feels it isn't working. Patient advised that a message would be sent to her provider for recommendations and that she would be contacted with a response. Verbalized understanding.

## 2017-08-08 NOTE — Telephone Encounter (Signed)
New message    Patient calling with concerns of afib. Unable to sleep at night. BP last night 201/78 HR64

## 2017-08-08 NOTE — Telephone Encounter (Signed)
Re turned a call to patient. Informed her that a download was done on her CPAP machine and is ok. Her symptoms are not coming from her sleep apnea. She needs appointment to  Evaluate her afib. Message will be sent to the schedulers for ASAP appointment.

## 2017-08-08 NOTE — Telephone Encounter (Signed)
Follow up   Patient calling back for recommendations

## 2017-08-09 ENCOUNTER — Ambulatory Visit: Payer: Medicare Other | Admitting: Physician Assistant

## 2017-08-09 DIAGNOSIS — G4730 Sleep apnea, unspecified: Secondary | ICD-10-CM | POA: Diagnosis not present

## 2017-08-09 DIAGNOSIS — Z0000 Encounter for general adult medical examination without abnormal findings: Secondary | ICD-10-CM | POA: Diagnosis not present

## 2017-08-09 DIAGNOSIS — I10 Essential (primary) hypertension: Secondary | ICD-10-CM | POA: Diagnosis not present

## 2017-08-09 DIAGNOSIS — M8580 Other specified disorders of bone density and structure, unspecified site: Secondary | ICD-10-CM | POA: Diagnosis not present

## 2017-08-09 DIAGNOSIS — E039 Hypothyroidism, unspecified: Secondary | ICD-10-CM | POA: Diagnosis not present

## 2017-08-09 NOTE — Telephone Encounter (Signed)
ok 

## 2017-08-13 ENCOUNTER — Encounter (HOSPITAL_COMMUNITY): Payer: Self-pay | Admitting: Nurse Practitioner

## 2017-08-13 ENCOUNTER — Ambulatory Visit (HOSPITAL_COMMUNITY)
Admission: RE | Admit: 2017-08-13 | Discharge: 2017-08-13 | Disposition: A | Discharge status: Home / Self Care | Payer: Medicare Other | Source: Ambulatory Visit | Attending: Nurse Practitioner | Admitting: Nurse Practitioner

## 2017-08-13 VITALS — BP 166/78 | HR 61 | Ht 60.0 in | Wt 189.0 lb

## 2017-08-13 DIAGNOSIS — Z7982 Long term (current) use of aspirin: Secondary | ICD-10-CM | POA: Insufficient documentation

## 2017-08-13 DIAGNOSIS — Z85841 Personal history of malignant neoplasm of brain: Secondary | ICD-10-CM | POA: Diagnosis not present

## 2017-08-13 DIAGNOSIS — Z9851 Tubal ligation status: Secondary | ICD-10-CM | POA: Insufficient documentation

## 2017-08-13 DIAGNOSIS — I493 Ventricular premature depolarization: Secondary | ICD-10-CM | POA: Diagnosis not present

## 2017-08-13 DIAGNOSIS — E039 Hypothyroidism, unspecified: Secondary | ICD-10-CM | POA: Diagnosis not present

## 2017-08-13 DIAGNOSIS — G4730 Sleep apnea, unspecified: Secondary | ICD-10-CM | POA: Insufficient documentation

## 2017-08-13 DIAGNOSIS — Z823 Family history of stroke: Secondary | ICD-10-CM | POA: Insufficient documentation

## 2017-08-13 DIAGNOSIS — Z79890 Hormone replacement therapy (postmenopausal): Secondary | ICD-10-CM | POA: Insufficient documentation

## 2017-08-13 DIAGNOSIS — R002 Palpitations: Secondary | ICD-10-CM

## 2017-08-13 DIAGNOSIS — Z9889 Other specified postprocedural states: Secondary | ICD-10-CM | POA: Diagnosis not present

## 2017-08-13 DIAGNOSIS — I4891 Unspecified atrial fibrillation: Secondary | ICD-10-CM | POA: Diagnosis not present

## 2017-08-13 DIAGNOSIS — M1990 Unspecified osteoarthritis, unspecified site: Secondary | ICD-10-CM | POA: Insufficient documentation

## 2017-08-13 DIAGNOSIS — I10 Essential (primary) hypertension: Secondary | ICD-10-CM | POA: Insufficient documentation

## 2017-08-13 DIAGNOSIS — Z79899 Other long term (current) drug therapy: Secondary | ICD-10-CM | POA: Insufficient documentation

## 2017-08-13 DIAGNOSIS — Z888 Allergy status to other drugs, medicaments and biological substances status: Secondary | ICD-10-CM | POA: Diagnosis not present

## 2017-08-13 DIAGNOSIS — Z8249 Family history of ischemic heart disease and other diseases of the circulatory system: Secondary | ICD-10-CM | POA: Insufficient documentation

## 2017-08-13 NOTE — Progress Notes (Signed)
Primary Care Physician: Brianna Pretty, MD Referring Physician: Dr. Delilah Reyes is a 84 y.o. female with a h/o HTN, palpitations, afib diagnosed last September after a visit to the dentist. . Has chadsvasc score of  4 but has chosen to  be on ASA only. She is being seen in  the afib clinic for her c/o's of having palpitations all night long which keeps her from sleeping but no issues during the day. Previous event monitor has shown PVC's.  She states that this has been happening every night for  the last 2 weeks. She wants this resolved as she is very tired during the day for staying awake with her heart beating  irregularly. She was seen by Brianna Deforest, PA, for atypical chest pain and did not want a Lexi Myoview. Echo ordered and pending at a later date.  Today, she denies symptoms of palpitations, chest pain, shortness of breath, orthopnea, PND, lower extremity edema, dizziness, presyncope, syncope, or neurologic sequela. + for irregular heart bet at night. The patient is tolerating medications without difficulties and is otherwise without complaint today.   Past Medical History:  Diagnosis Date  . Arthritis   . Brain tumor ([place])   . Essential hypertension    a. 10/2011 Echo: EF >55%; b. 04/2014 Echo: EF 60-65%, triv AI, mildly dil RA.  Marland Kitchen Hypothyroidism   . Meningioma ([place])   . Palpitations    a. 10/2011 Cardionet; b. 02/2014 Cardionet: sinus rhythm, occas unifocal isolated PVC's, no signif brady/tachy.  . Pedal edema   . Sleep apnea 04/22/08   SLEEP STUDY done @ [facility]. Read by Dr. Keturah Barre. Repeat study done 05/22/11 @ [facility].   Past Surgical History:  Procedure Laterality Date  . APPENDECTOMY    . TONSILLECTOMY    . TUBAL LIGATION      Current Outpatient Medications  Medication Sig Dispense Refill  . aspirin EC 81 MG tablet Take 1 tablet (81 mg total) by mouth daily. 90 tablet 3  . CRANBERRY PO Take 1 capsule by mouth daily.    . hydrALAZINE  (APRESOLINE) 25 MG tablet Take 1 tablet (25 mg total) by mouth 3 (three) times daily. (Patient taking differently: Take 25 mg by mouth 3 (three) times daily as needed. ) 360 tablet 3  . levothyroxine (SYNTHROID, LEVOTHROID) 88 MCG tablet Take 88 mcg by mouth daily before breakfast.    . lisinopril (PRINIVIL,ZESTRIL) 20 MG tablet Take 20 mg by mouth at bedtime.    Marland Kitchen lisinopril-hydrochlorothiazide (PRINZIDE,ZESTORETIC) 20-12.5 MG tablet Take 1 tablet by mouth daily. 90 tablet 2  . LUTEIN-ZEAXANTHIN PO Take 1 tablet by mouth daily.    Marland Kitchen MAGNESIUM PO Take 500 mg by mouth.    . metoprolol succinate (TOPROL-XL) 100 MG 24 hr tablet Take 1 tablet (100 mg total) by mouth daily. Take with or immediately following a meal. 90 tablet 3  . Multiple Vitamin (MULTIVITAMIN WITH MINERALS) TABS tablet Take 1 tablet by mouth daily. Contains vitamin C    . Multiple Vitamins-Minerals (ALIVE ENERGY 50+ PO) Take by mouth.    . nitroGLYCERIN (NITROSTAT) 0.4 MG SL tablet Place 1 tablet (0.4 mg total) under the tongue every 5 (five) minutes as needed for chest pain. 25 tablet 3  . NON FORMULARY CPAP THERAPY    . omega-3 acid ethyl esters (LOVAZA) 1 G capsule Take 1 g by mouth daily.     . Zinc 100 MG TABS Take 50  tablets by mouth.     No current facility-administered medications for this encounter.     Allergies  Allergen Reactions  . Other Other (See Comments)    CORTISONE INJECTION; severe dizziness and made her very sick; pt states "it about killed me"    Social History   Socioeconomic History  . Marital status: Single    Spouse name: Not on file  . Number of children: Not on file  . Years of education: Not on file  . Highest education level: Not on file  Occupational History  . Not on file  Social Needs  . Financial resource strain: Not on file  . Food insecurity:    Worry: Not on file    Inability: Not on file  . Transportation needs:    Medical: Not on file    Non-medical: Not on file  Tobacco  Use  . Smoking status: Never Smoker  . Smokeless tobacco: Never Used  Substance and Sexual Activity  . Alcohol use: No    Alcohol/week: 0.0 oz  . Drug use: No  . Sexual activity: Never  Lifestyle  . Physical activity:    Days per week: Not on file    Minutes per session: Not on file  . Stress: Not on file  Relationships  . Social connections:    Talks on phone: Not on file    Gets together: Not on file    Attends religious service: Not on file    Active member of club or organization: Not on file    Attends meetings of clubs or organizations: Not on file    Relationship status: Not on file  . Intimate partner violence:    Fear of current or ex partner: Not on file    Emotionally abused: Not on file    Physically abused: Not on file    Forced sexual activity: Not on file  Other Topics Concern  . Not on file  Social History Narrative  . Not on file    Family History  Problem Relation Age of Onset  . Heart disease Brother        ischemic  . Hypertension Unknown   . Stroke Unknown     ROS- All systems are reviewed and negative except as per the HPI above  Physical Exam: Vitals:   08/13/17 1402  BP: (!) 166/78  Pulse: 61  SpO2: 98%  Weight: 189 lb (85.7 kg)  Height: 5' (1.524 m)   Wt Readings from Last 3 Encounters:  08/13/17 189 lb (85.7 kg)  07/05/17 192 lb 12.8 oz (87.5 kg)  05/07/17 190 lb (86.2 kg)    Labs: Lab Results  Component Value Date   NA 139 07/03/2017   K 4.3 07/03/2017   CL 98 07/03/2017   CO2 26 07/03/2017   GLUCOSE 91 07/03/2017   BUN 19 07/03/2017   CREATININE 0.77 07/03/2017   CALCIUM 10.1 07/03/2017   MG 2.2 11/29/2016   No results found for: INR Lab Results  Component Value Date   CHOL 233 (H) 07/03/2017   HDL 65 07/03/2017   LDLCALC 151 (H) 07/03/2017   TRIG 83 07/03/2017     GEN- The patient is well appearing, alert and oriented x 3 today.   Head- normocephalic, atraumatic Eyes-  Sclera clear, conjunctiva pink Ears-  hearing intact Oropharynx- clear Neck- supple, no JVP Lymph- no cervical lymphadenopathy Lungs- Clear to ausculation bilaterally, normal work of breathing Heart- Regular rate and rhythm, no murmurs, rubs or gallops,  PMI not laterally displaced GI- soft, NT, ND, + BS Extremities- no clubbing, cyanosis, or edema MS- no significant deformity or atrophy Skin- no rash or lesion Psych- euthymic mood, full affect Neuro- strength and sensation are intact  EKG- NSR at 61 bpm, pr int 172 ms, qrs int 106 ms, qtc 422 ms Echo-2016-Study Conclusions  - Left ventricle: The cavity size was normal. Wall thickness was normal. Systolic function was normal. The estimated ejection fraction was in the range of 60% to 65%. - Aortic valve: There was trivial regurgitation. - Right atrium: The atrium was mildly dilated.    Assessment and Plan: 1. Palpitations Pt was found to be in afib in September 2018, following a dentist appointment, but resolved Now with  c/o palpitations at night while wearing cpap but not during the day Previous monitor showed PVC's She will wear a 48 hour monitor as she has palpitations every night, so I don't think I need a 30 day monitor to see what she is describing/feeling She is scheduled to have th monitor placed tomorrow and then f/u here in about  10 days  2. Chadsvasc score of 4 Discussed her risk of stroke She is not convinced that she needs anticoagulation She will wait and see what the monitor shows before comitting to anticoagulation  Brianna Reyes, [facility] [address] [place], [place] [address] [phone]

## 2017-08-14 ENCOUNTER — Ambulatory Visit (INDEPENDENT_AMBULATORY_CARE_PROVIDER_SITE_OTHER): Payer: Medicare Other

## 2017-08-14 ENCOUNTER — Other Ambulatory Visit: Payer: Self-pay | Admitting: Nurse Practitioner

## 2017-08-14 ENCOUNTER — Other Ambulatory Visit (HOSPITAL_COMMUNITY): Payer: Self-pay | Admitting: Nurse Practitioner

## 2017-08-14 DIAGNOSIS — I493 Ventricular premature depolarization: Secondary | ICD-10-CM

## 2017-08-14 DIAGNOSIS — R002 Palpitations: Secondary | ICD-10-CM | POA: Diagnosis not present

## 2017-08-19 ENCOUNTER — Other Ambulatory Visit: Payer: Self-pay | Admitting: Cardiovascular Disease

## 2017-08-23 ENCOUNTER — Encounter (HOSPITAL_COMMUNITY): Payer: Self-pay | Admitting: Nurse Practitioner

## 2017-08-23 ENCOUNTER — Ambulatory Visit (HOSPITAL_COMMUNITY)
Admission: RE | Admit: 2017-08-23 | Discharge: 2017-08-23 | Disposition: A | Discharge status: Home / Self Care | Payer: Medicare Other | Source: Ambulatory Visit | Attending: Nurse Practitioner | Admitting: Nurse Practitioner

## 2017-08-23 VITALS — BP 114/66 | HR 60 | Ht 60.0 in | Wt 190.0 lb

## 2017-08-23 DIAGNOSIS — R002 Palpitations: Secondary | ICD-10-CM | POA: Diagnosis not present

## 2017-08-23 DIAGNOSIS — G4730 Sleep apnea, unspecified: Secondary | ICD-10-CM | POA: Diagnosis not present

## 2017-08-23 DIAGNOSIS — I480 Paroxysmal atrial fibrillation: Secondary | ICD-10-CM

## 2017-08-23 DIAGNOSIS — E039 Hypothyroidism, unspecified: Secondary | ICD-10-CM | POA: Insufficient documentation

## 2017-08-23 DIAGNOSIS — I509 Heart failure, unspecified: Secondary | ICD-10-CM | POA: Diagnosis not present

## 2017-08-23 DIAGNOSIS — Z7982 Long term (current) use of aspirin: Secondary | ICD-10-CM | POA: Diagnosis not present

## 2017-08-23 DIAGNOSIS — I110 Hypertensive heart disease with heart failure: Secondary | ICD-10-CM | POA: Diagnosis not present

## 2017-08-23 DIAGNOSIS — Z79899 Other long term (current) drug therapy: Secondary | ICD-10-CM | POA: Diagnosis not present

## 2017-08-23 NOTE — Progress Notes (Signed)
Primary Care Physician: Deland Pretty, MD Referring Physician: Dr. [name] is a 84 y.o. female with a h/o HTN, palpitations, afib diagnosed last September after a visit to the dentist. . Has chadsvasc score of  4 but has chosen to  be on ASA only. She is being seen in  the afib clinic for her c/o's of having palpitations all night long which keeps her from sleeping but no issues during the day. Previous event monitor has shown PVC's.  She states that this has been happening every night for  the last 2 weeks. She wants this resolved as she is very tired during the day for staying awake with her heart beating  irregularly. She was seen by Almyra Deforest, PA, for atypical chest pain and did not want a Lexi Myoview.   Today, denies symptoms of palpitations, chest pain, shortness of breath, orthopnea, PND, lower extremity edema, claudication, dizziness, presyncope, syncope, bleeding, or neurologic sequela. The patient is tolerating medications without difficulties.  She is feeling well today.  She says most of her symptoms occur at night.  She wore a Holter monitor that showed short runs of atrial fibrillation.  She says that she slept on 4 pillows last night not helped with her atrial fibrillation symptoms.  She does have central sleep apnea which she feels may be contributing.  She is currently not anticoagulated.  Past Medical History:  Diagnosis Date  . Arthritis   . Brain tumor ([place])   . Essential hypertension    a. 10/2011 Echo: EF >55%; b. 04/2014 Echo: EF 60-65%, triv AI, mildly dil RA.  Marland Kitchen Hypothyroidism   . Meningioma ([place])   . Palpitations    a. 10/2011 Cardionet; b. 02/2014 Cardionet: sinus rhythm, occas unifocal isolated PVC's, no signif brady/tachy.  . Pedal edema   . Sleep apnea 04/22/08   SLEEP STUDY done @ [facility]. Read by Dr. Keturah Barre. Repeat study done 05/22/11 @ [facility].   Past Surgical History:  Procedure Laterality Date  . APPENDECTOMY    .  TONSILLECTOMY    . TUBAL LIGATION      Current Outpatient Medications  Medication Sig Dispense Refill  . aspirin EC 81 MG tablet Take 1 tablet (81 mg total) by mouth daily. 90 tablet 3  . CRANBERRY PO Take 1 capsule by mouth daily.    . hydrALAZINE (APRESOLINE) 25 MG tablet Take 1 tablet (25 mg total) by mouth 3 (three) times daily. (Patient taking differently: Take 25 mg by mouth 3 (three) times daily as needed. ) 360 tablet 3  . levothyroxine (SYNTHROID, LEVOTHROID) 88 MCG tablet Take 88 mcg by mouth daily before breakfast.    . lisinopril (PRINIVIL,ZESTRIL) 20 MG tablet Take 20 mg by mouth at bedtime.    Marland Kitchen lisinopril-hydrochlorothiazide (PRINZIDE,ZESTORETIC) 20-12.5 MG tablet Take 1 tablet by mouth daily. 90 tablet 2  . LUTEIN-ZEAXANTHIN PO Take 1 tablet by mouth daily.    Marland Kitchen MAGNESIUM PO Take 500 mg by mouth.    . metoprolol succinate (TOPROL-XL) 100 MG 24 hr tablet Take 1 tablet (100 mg total) by mouth daily. Take with or immediately following a meal. 90 tablet 3  . Multiple Vitamin (MULTIVITAMIN WITH MINERALS) TABS tablet Take 1 tablet by mouth daily. Contains vitamin C    . Multiple Vitamins-Minerals (ALIVE ENERGY 50+ PO) Take by mouth.    . nitroGLYCERIN (NITROSTAT) 0.4 MG SL tablet Place 1 tablet (0.4 mg total) under the tongue every  5 (five) minutes as needed for chest pain. 25 tablet 3  . NON FORMULARY CPAP THERAPY    . omega-3 acid ethyl esters (LOVAZA) 1 G capsule Take 1 g by mouth daily.     . Zinc 100 MG TABS Take 50 tablets by mouth.     No current facility-administered medications for this encounter.     Allergies  Allergen Reactions  . Other Other (See Comments)    CORTISONE INJECTION; severe dizziness and made her very sick; pt states "it about killed me"    Social History   Socioeconomic History  . Marital status: Single    Spouse name: Not on file  . Number of children: Not on file  . Years of education: Not on file  . Highest education level: Not on file    Occupational History  . Not on file  Social Needs  . Financial resource strain: Not on file  . Food insecurity:    Worry: Not on file    Inability: Not on file  . Transportation needs:    Medical: Not on file    Non-medical: Not on file  Tobacco Use  . Smoking status: Never Smoker  . Smokeless tobacco: Never Used  Substance and Sexual Activity  . Alcohol use: No    Alcohol/week: 0.0 oz  . Drug use: No  . Sexual activity: Never  Lifestyle  . Physical activity:    Days per week: Not on file    Minutes per session: Not on file  . Stress: Not on file  Relationships  . Social connections:    Talks on phone: Not on file    Gets together: Not on file    Attends religious service: Not on file    Active member of club or organization: Not on file    Attends meetings of clubs or organizations: Not on file    Relationship status: Not on file  . Intimate partner violence:    Fear of current or ex partner: Not on file    Emotionally abused: Not on file    Physically abused: Not on file    Forced sexual activity: Not on file  Other Topics Concern  . Not on file  Social History Narrative  . Not on file    Family History  Problem Relation Age of Onset  . Heart disease Brother        ischemic  . Hypertension Unknown   . Stroke Unknown     Physical Exam: Vitals:   08/23/17 1428  BP: 114/66  Pulse: 60  Weight: 190 lb (86.2 kg)  Height: 5' (1.524 m)   Wt Readings from Last 3 Encounters:  08/23/17 190 lb (86.2 kg)  08/13/17 189 lb (85.7 kg)  07/05/17 192 lb 12.8 oz (87.5 kg)    Labs: Lab Results  Component Value Date   NA 139 07/03/2017   K 4.3 07/03/2017   CL 98 07/03/2017   CO2 26 07/03/2017   GLUCOSE 91 07/03/2017   BUN 19 07/03/2017   CREATININE 0.77 07/03/2017   CALCIUM 10.1 07/03/2017   MG 2.2 11/29/2016   No results found for: INR Lab Results  Component Value Date   CHOL 233 (H) 07/03/2017   HDL 65 07/03/2017   LDLCALC 151 (H) 07/03/2017   TRIG  83 07/03/2017   ROS:  Please see the history of present illness.   Otherwise, review of systems is positive for none.   All other systems are reviewed and negative.  PHYSICAL EXAM: VS:  BP 114/66 (BP Location: Left Arm, Patient Position: Sitting, Cuff Size: Large)   Pulse 60   Ht 5' (1.524 m)   Wt 190 lb (86.2 kg)   BMI 37.11 kg/m  , BMI Body mass index is 37.11 kg/m. GEN: Well nourished, well developed, in no acute distress  HEENT: normal  Neck: no JVD, carotid bruits, or masses Cardiac: RRR; no murmurs, rubs, or gallops,no edema  Respiratory:  clear to auscultation bilaterally, normal work of breathing GI: soft, nontender, nondistended, + BS MS: no deformity or atrophy  Skin: warm and dry Neuro:  Strength and sensation are intact Psych: euthymic mood, full affect  EKG:  EKG is ordered today. Personal review of the ekg ordered  shows sinus rhythm, rate 60  Echo-2016-Study Conclusions  - Left ventricle: The cavity size was normal. Wall thickness was normal. Systolic function was normal. The estimated ejection fraction was in the range of 60% to 65%. - Aortic valve: There was trivial regurgitation. - Right atrium: The atrium was mildly dilated.    Assessment and Plan: 1. Palpitations Atrial fibrillation resolved.  She does continue to have palpitations.  It appears that she has atrial fibrillation, though she has quick bursts at night.  I talked to her about the possibility of anticoagulation.  She does have a high stroke risk.  She would like to think about this and Victorina Kable call us back.  She does have Eliquis at home that she can start if she decides she wants to be anticoagulated.  This patients CHA2DS2-VASc Score and unadjusted Ischemic Stroke Rate (% per year) is equal to 47 he is on a 4.8 % stroke rate/year from a score of 4  Above score calculated as 1 point each if present [CHF, HTN, DM, Vascular=MI/PAD/Aortic Plaque, Age if 65-74, or Female] Above score  calculated as 2 points each if present [Age > 75, or Stroke/TIA/TE]     2. Chadsvasc score of 4  anticoagulation recommended.  Lexi Conaty Curt Bears, MD 08/23/2017 3:07 PM

## 2017-08-28 ENCOUNTER — Encounter (HOSPITAL_COMMUNITY): Payer: Self-pay | Admitting: *Deleted

## 2017-08-30 ENCOUNTER — Ambulatory Visit: Payer: Medicare Other | Admitting: Physician Assistant

## 2017-09-14 ENCOUNTER — Telehealth: Payer: Self-pay | Admitting: Cardiovascular Disease

## 2017-09-14 DIAGNOSIS — I480 Paroxysmal atrial fibrillation: Secondary | ICD-10-CM

## 2017-09-14 NOTE — Telephone Encounter (Signed)
Returned call to pt she states she has went to the afib clinic twice and she states that "they have not done a thing for her and that young doctor didn't do anything either" she states that she is very concerned that she is still in afib. She has osa and uses CPAP. She states that she is fine during the day and as soon as she lays down to go do bed it starts again and she is up Q2hrs with the AFIB and is very concerned and thinks that she needs to be seen either dr by AFIB, Dr Claiborne Billings or Butch Penny before her scheduled test this has to stop!!" please advise

## 2017-09-14 NOTE — Telephone Encounter (Signed)
OK to get another opinion from Dr. Rayann Heman. Pleases refer. MCr

## 2017-09-14 NOTE — Telephone Encounter (Signed)
New Message:        Pt is calling and states she is having afib issues and would like to be referred to Dr. Rayann Heman.

## 2017-09-14 NOTE — Telephone Encounter (Signed)
Message sent to [place]

## 2017-09-19 ENCOUNTER — Other Ambulatory Visit: Payer: Self-pay

## 2017-09-19 DIAGNOSIS — I480 Paroxysmal atrial fibrillation: Secondary | ICD-10-CM

## 2017-09-19 NOTE — Progress Notes (Signed)
Note OK to get another opinion from Dr. Rayann Heman. Pleases refer. MCr

## 2017-09-24 NOTE — Telephone Encounter (Signed)
Agree with plan for patient to see Dr. Rayann Heman.  Can consider increasing metoprolol dose if recurrent symptomatology.

## 2017-09-24 NOTE — Telephone Encounter (Signed)
OK to get another opinion from Dr. Rayann Heman. Pleases refer. MCr

## 2017-10-01 NOTE — Telephone Encounter (Signed)
[facility] APPT  10/08/2017 4:30 PM

## 2017-10-05 ENCOUNTER — Other Ambulatory Visit: Payer: Self-pay | Admitting: *Deleted

## 2017-10-05 DIAGNOSIS — E785 Hyperlipidemia, unspecified: Secondary | ICD-10-CM

## 2017-10-05 DIAGNOSIS — Z79899 Other long term (current) drug therapy: Secondary | ICD-10-CM

## 2017-10-08 ENCOUNTER — Encounter (INDEPENDENT_AMBULATORY_CARE_PROVIDER_SITE_OTHER): Payer: Self-pay

## 2017-10-08 ENCOUNTER — Telehealth: Payer: Self-pay | Admitting: Physician Assistant

## 2017-10-08 ENCOUNTER — Encounter: Payer: Self-pay | Admitting: Internal Medicine

## 2017-10-08 ENCOUNTER — Ambulatory Visit: Payer: Medicare Other | Admitting: Internal Medicine

## 2017-10-08 VITALS — BP 126/64 | HR 57 | Ht 60.0 in | Wt 189.0 lb

## 2017-10-08 DIAGNOSIS — R002 Palpitations: Secondary | ICD-10-CM | POA: Diagnosis not present

## 2017-10-08 DIAGNOSIS — Z9989 Dependence on other enabling machines and devices: Secondary | ICD-10-CM | POA: Diagnosis not present

## 2017-10-08 DIAGNOSIS — I480 Paroxysmal atrial fibrillation: Secondary | ICD-10-CM

## 2017-10-08 DIAGNOSIS — G4733 Obstructive sleep apnea (adult) (pediatric): Secondary | ICD-10-CM | POA: Diagnosis not present

## 2017-10-08 MED ORDER — FLECAINIDE ACETATE 50 MG PO TABS: 50.0000 mg | ORAL_TABLET | Freq: Two times a day (BID) | ORAL | 3 refills | Status: DC

## 2017-10-08 NOTE — Progress Notes (Signed)
Electrophysiology Office Note   Date:  10/08/2017   ID:  ARTURO FREUNDLICH, DOB [birth year], MRN [id number]  PCP:  Deland Pretty, MD  Cardiologist:  Dr Claiborne Billings Primary Electrophysiologist: Dr Curt Bears  CC: afib   History of Present Illness: TORIANNE LAFLAM is a 84 y.o. female who presents today for electrophysiology evaluation.   She has been documented to have afib.  She also has occasional PVCs.  She was most recently seen in the AF clinic by Dr Curt Bears.  A conservative approach was planned. She presents today for "a second opinion".  She states that she has difficulty sleeping due to episodes of afib.  She has irregular tachypalpitations most nights.  These are anxiety provoking for her.  She feels that episodes are triggered by sleep apnea.  She follows with Dr Claiborne Billings and wonders if her sleep therapy needs to be adjusted.   Today, she denies symptoms of chest pain, shortness of breath, orthopnea, PND, lower extremity edema, claudication, dizziness, presyncope, syncope, bleeding, or neurologic sequela. The patient is tolerating medications without difficulties and is otherwise without complaint today.    Past Medical History:  Diagnosis Date  . Arthritis   . Brain tumor ([place])   . Essential hypertension    a. 10/2011 Echo: EF >55%; b. 04/2014 Echo: EF 60-65%, triv AI, mildly dil RA.  Marland Kitchen Hypothyroidism   . Meningioma ([place])   . Palpitations    a. 10/2011 Cardionet; b. 02/2014 Cardionet: sinus rhythm, occas unifocal isolated PVC's, no signif brady/tachy.  . Pedal edema   . Sleep apnea 04/22/08   SLEEP STUDY done @ [facility]. Read by Dr. Keturah Barre. Repeat study done 05/22/11 @ [facility].   Past Surgical History:  Procedure Laterality Date  . APPENDECTOMY    . TONSILLECTOMY    . TUBAL LIGATION       Current Outpatient Medications  Medication Sig Dispense Refill  . CRANBERRY PO Take 1 capsule by mouth daily.    Marland Kitchen levothyroxine (SYNTHROID, LEVOTHROID) 88 MCG tablet  Take 88 mcg by mouth daily before breakfast.    . lisinopril (PRINIVIL,ZESTRIL) 20 MG tablet Take 20 mg by mouth at bedtime.    Marland Kitchen lisinopril-hydrochlorothiazide (PRINZIDE,ZESTORETIC) 20-12.5 MG tablet Take 1 tablet by mouth daily. 90 tablet 2  . LUTEIN-ZEAXANTHIN PO Take 1 tablet by mouth daily.    Marland Kitchen MAGNESIUM PO Take 500 mg by mouth.    . Multiple Vitamin (MULTIVITAMIN WITH MINERALS) TABS tablet Take 1 tablet by mouth daily. Contains vitamin C    . Multiple Vitamins-Minerals (ALIVE ENERGY 50+ PO) Take by mouth.    . NON FORMULARY CPAP THERAPY    . omega-3 acid ethyl esters (LOVAZA) 1 G capsule Take 1 g by mouth daily.     . hydrALAZINE (APRESOLINE) 25 MG tablet Take 1 tablet (25 mg total) by mouth 3 (three) times daily. (Patient not taking: Reported on 10/08/2017) 360 tablet 3  . metoprolol succinate (TOPROL-XL) 100 MG 24 hr tablet Take 1 tablet (100 mg total) by mouth daily. Take with or immediately following a meal. 90 tablet 3  . nitroGLYCERIN (NITROSTAT) 0.4 MG SL tablet Place 1 tablet (0.4 mg total) under the tongue every 5 (five) minutes as needed for chest pain. 25 tablet 3   No current facility-administered medications for this visit.     Allergies:   Other   Social History:  The patient  reports that she has never smoked. She has never used smokeless tobacco.  She reports that she does not drink alcohol or use drugs.   Family History:  The patient's  family history includes Heart disease in her brother; Hypertension in her unknown relative; Stroke in her unknown relative.    ROS:  Please see the history of present illness.   All other systems are personally reviewed and negative.    PHYSICAL EXAM: VS:  BP 126/64   Pulse (!) 57   Ht 5' (1.524 m)   Wt 189 lb (85.7 kg)   BMI 36.91 kg/m  , BMI Body mass index is 36.91 kg/m. GEN: Well nourished, well developed, in no acute distress  HEENT: normal  Neck: no JVD, carotid bruits, or masses Cardiac: RRR; no murmurs, rubs, or  gallops, +1edema  Respiratory:  clear to auscultation bilaterally, normal work of breathing GI: soft, nontender, nondistended, + BS MS: no deformity or atrophy  Skin: warm and dry  Neuro:  Strength and sensation are intact Psych: euthymic mood, full affect  EKG:  EKG is ordered today. The ekg ordered today is personally reviewed and shows sinus rhythm 57 bpm, PR 186 msec, QRS 94 msec, Qtc 406 msec   Recent Labs: 11/29/2016: B Natriuretic Peptide 29.2; Magnesium 2.2 05/04/2017: Hemoglobin 12.4; Platelets 259; TSH 3.400 07/03/2017: ALT 17; BUN 19; Creatinine, Ser 0.77; Potassium 4.3; Sodium 139  personally reviewed   Lipid Panel     Component Value Date/Time   CHOL 233 (H) 07/03/2017 1137   TRIG 83 07/03/2017 1137   HDL 65 07/03/2017 1137   CHOLHDL 3.6 07/03/2017 1137   CHOLHDL 4.0 03/26/2014 1023   VLDL 19 03/26/2014 1023   LDLCALC 151 (H) 07/03/2017 1137   personally reviewed   Wt Readings from Last 3 Encounters:  10/08/17 189 lb (85.7 kg)  08/23/17 190 lb (86.2 kg)  08/13/17 189 lb (85.7 kg)      Other studies personally reviewed: Additional studies/ records that were reviewed today include: AF clinic notes, prior echo, Dr Evette Georges notes  Review of the above records today demonstrates: as above   ASSESSMENT AND PLAN:  1.  Paroxysmal atrial fibrillation The patient has symptomatic atrial fibrillation.  She has not tried AAD therapy. Her chads2vasc score is at least 4.  She is clear in her decision to decline anticoagulation.  She states "I dont think that a stroke rate of 5% is that high".  I have tried to explain that an annual risk of stroke of 5% is very high and that guidelines advise anticoagulation.  She continues to decline. We discussed AAD options at length.  She would prefer flecainide over multaq, tikosyn, sotalol, or amiodarone. I will therefore start flecainide 50mg  BID today.  She will return for an EKG in the AF clinic in 3 days.    2. OSA Followed by Dr  Claiborne Billings  3. HTN Stable No change required today   Follow-up:  Return to see me in a month  Current medicines are reviewed at length with the patient today.   The patient does not have concerns regarding her medicines.  The following changes were made today:  none    Signed, Thompson Grayer, MD  10/08/2017 5:12 PM     [facility] [address] [place] [address] [phone] (office) [phone] (fax)

## 2017-10-08 NOTE — Patient Instructions (Addendum)
Medication Instructions:  Your physician has recommended you make the following change in your medication:  1.  Start taking flecainide 50 mg one tablet by mouth twice a day.  Labwork: None ordered.  Testing/Procedures: You will go to the Afib clinic October 11, 2017 for a 12 lead EKG. Please schedule.  Follow-Up: Your physician wants you to follow-up in: 4 weeks with Dr. Rayann Heman.     Any Other Special Instructions Will Be Listed Below (If Applicable).  If you need a refill on your cardiac medications before your next appointment, please call your pharmacy.   Flecainide tablets What is this medicine? FLECAINIDE (FLEK a nide) is an antiarrhythmic drug. This medicine is used to prevent irregular heart rhythm. It can also slow down fast heartbeats called tachycardia. This medicine may be used for other purposes; ask your health care provider or pharmacist if you have questions. COMMON BRAND NAME(S): Tambocor What should I tell my health care provider before I take this medicine? They need to know if you have any of these conditions: -abnormal levels of potassium in the blood -heart disease including heart rhythm and heart rate problems -kidney or liver disease -recent heart attack -an unusual or allergic reaction to flecainide, local anesthetics, other medicines, foods, dyes, or preservatives -pregnant or trying to get pregnant -breast-feeding How should I use this medicine? Take this medicine by mouth with a glass of water. Follow the directions on the prescription label. You can take this medicine with or without food. Take your doses at regular intervals. Do not take your medicine more often than directed. Do not stop taking this medicine suddenly. This may cause serious, heart-related side effects. If your doctor wants you to stop the medicine, the dose may be slowly lowered over time to avoid any side effects. Talk to your pediatrician regarding the use of this medicine in children.  While this drug may be prescribed for children as young as 1 year of age for selected conditions, precautions do apply. Overdosage: If you think you have taken too much of this medicine contact a poison control center or emergency room at once. NOTE: This medicine is only for you. Do not share this medicine with others. What if I miss a dose? If you miss a dose, take it as soon as you can. If it is almost time for your next dose, take only that dose. Do not take double or extra doses. What may interact with this medicine? Do not take this medicine with any of the following medications: -amoxapine -arsenic trioxide -certain antibiotics like clarithromycin, erythromycin, gatifloxacin, gemifloxacin, levofloxacin, moxifloxacin, sparfloxacin, or troleandomycin -certain antidepressants called tricyclic antidepressants like amitriptyline, imipramine, or nortriptyline -certain medicines to control heart rhythm like disopyramide, dofetilide, encainide, moricizine, procainamide, propafenone, and quinidine -cisapride -cyclobenzaprine -delavirdine -droperidol -haloperidol -hawthorn -imatinib -levomethadyl -maprotiline -medicines for malaria like chloroquine and halofantrine -pentamidine -phenothiazines like chlorpromazine, mesoridazine, prochlorperazine, thioridazine -pimozide -quinine -ranolazine -ritonavir -sertindole -ziprasidone This medicine may also interact with the following medications: -cimetidine -medicines for angina or high blood pressure -medicines to control heart rhythm like amiodarone and digoxin This list may not describe all possible interactions. Give your health care provider a list of all the medicines, herbs, non-prescription drugs, or dietary supplements you use. Also tell them if you smoke, drink alcohol, or use illegal drugs. Some items may interact with your medicine. What should I watch for while using this medicine? Visit your doctor or health care professional  for regular checks on your progress. Because your condition  and the use of this medicine carries some risk, it is a good idea to carry an identification card, necklace or bracelet with details of your condition, medications and doctor or health care professional. Check your blood pressure and pulse rate regularly. Ask your health care professional what your blood pressure and pulse rate should be, and when you should contact him or her. Your doctor or health care professional also may schedule regular blood tests and electrocardiograms to check your progress. You may get drowsy or dizzy. Do not drive, use machinery, or do anything that needs mental alertness until you know how this medicine affects you. Do not stand or sit up quickly, especially if you are an older patient. This reduces the risk of dizzy or fainting spells. Alcohol can make you more dizzy, increase flushing and rapid heartbeats. Avoid alcoholic drinks. What side effects may I notice from receiving this medicine? Side effects that you should report to your doctor or health care professional as soon as possible: -chest pain, continued irregular heartbeats -difficulty breathing -swelling of the legs or feet -trembling, shaking -unusually weak or tired Side effects that usually do not require medical attention (report to your doctor or health care professional if they continue or are bothersome): -blurred vision -constipation -headache -nausea, vomiting -stomach pain This list may not describe all possible side effects. Call your doctor for medical advice about side effects. You may report side effects to FDA at 1-800-FDA-1088. Where should I keep my medicine? Keep out of the reach of children. Store at room temperature between 15 and 30 degrees C (59 and 86 degrees F). Protect from light. Keep container tightly closed. Throw away any unused medicine after the expiration date. NOTE: This sheet is a summary. It may not cover all  possible information. If you have questions about this medicine, talk to your doctor, pharmacist, or health care provider.  2018 Elsevier/Gold Standard (2007-07-03 16:46:09)

## 2017-10-08 NOTE — Telephone Encounter (Signed)
Paged by answering service.  Patient was seen by Dr. Rayann Heman today and prescribed flecainide 50 mg twice daily however patient never got prescription.  New prescription sent to requested pharmacy.  Currently pending note.  However patient confirm from after visit summary.

## 2017-10-11 ENCOUNTER — Ambulatory Visit (HOSPITAL_COMMUNITY)
Admission: RE | Admit: 2017-10-11 | Discharge: 2017-10-11 | Disposition: A | Discharge status: Home / Self Care | Payer: Medicare Other | Source: Ambulatory Visit | Attending: Nurse Practitioner | Admitting: Nurse Practitioner

## 2017-10-11 DIAGNOSIS — R002 Palpitations: Secondary | ICD-10-CM | POA: Insufficient documentation

## 2017-10-11 DIAGNOSIS — Z79899 Other long term (current) drug therapy: Secondary | ICD-10-CM | POA: Insufficient documentation

## 2017-10-11 DIAGNOSIS — R9431 Abnormal electrocardiogram [ECG] [EKG]: Secondary | ICD-10-CM | POA: Insufficient documentation

## 2017-10-11 NOTE — Progress Notes (Signed)
Pt is in for EKG after start of flecainide 50 mg bid for palpitations mostly at night. Pt states that she is sleeping better and noticing less skips in her heart. She is in SR today and no interval changes.

## 2017-10-19 ENCOUNTER — Ambulatory Visit (HOSPITAL_COMMUNITY): Payer: Medicare Other | Attending: Internal Medicine

## 2017-10-19 ENCOUNTER — Other Ambulatory Visit: Payer: Self-pay

## 2017-10-19 DIAGNOSIS — R0789 Other chest pain: Secondary | ICD-10-CM | POA: Insufficient documentation

## 2017-10-19 DIAGNOSIS — I10 Essential (primary) hypertension: Secondary | ICD-10-CM | POA: Diagnosis not present

## 2017-10-19 DIAGNOSIS — R002 Palpitations: Secondary | ICD-10-CM | POA: Diagnosis not present

## 2017-10-19 DIAGNOSIS — E785 Hyperlipidemia, unspecified: Secondary | ICD-10-CM | POA: Diagnosis not present

## 2017-10-19 DIAGNOSIS — R079 Chest pain, unspecified: Secondary | ICD-10-CM | POA: Insufficient documentation

## 2017-10-19 DIAGNOSIS — I4891 Unspecified atrial fibrillation: Secondary | ICD-10-CM | POA: Insufficient documentation

## 2017-10-19 DIAGNOSIS — I351 Nonrheumatic aortic (valve) insufficiency: Secondary | ICD-10-CM | POA: Diagnosis not present

## 2017-10-26 ENCOUNTER — Other Ambulatory Visit: Payer: Self-pay | Admitting: Cardiovascular Disease

## 2017-11-09 ENCOUNTER — Encounter: Payer: Self-pay | Admitting: Internal Medicine

## 2017-11-09 ENCOUNTER — Encounter (INDEPENDENT_AMBULATORY_CARE_PROVIDER_SITE_OTHER): Payer: Self-pay

## 2017-11-09 ENCOUNTER — Ambulatory Visit: Payer: Medicare Other | Admitting: Internal Medicine

## 2017-11-09 VITALS — BP 126/76 | HR 48 | Ht 60.0 in | Wt 189.8 lb

## 2017-11-09 DIAGNOSIS — R002 Palpitations: Secondary | ICD-10-CM

## 2017-11-09 DIAGNOSIS — I480 Paroxysmal atrial fibrillation: Secondary | ICD-10-CM | POA: Diagnosis not present

## 2017-11-09 DIAGNOSIS — Z79899 Other long term (current) drug therapy: Secondary | ICD-10-CM

## 2017-11-09 DIAGNOSIS — G4733 Obstructive sleep apnea (adult) (pediatric): Secondary | ICD-10-CM | POA: Diagnosis not present

## 2017-11-09 MED ORDER — METOPROLOL SUCCINATE ER 50 MG PO TB24: 50.0000 mg | ORAL_TABLET | Freq: Every day | ORAL | 3 refills | Status: DC

## 2017-11-09 NOTE — Progress Notes (Signed)
PCP: Deland Pretty, MD Primary Cardiologist: Dr Claiborne Billings Primary EP: Dr Verdell Face is a 84 y.o. female who presents today for routine electrophysiology followup.  Since last being seen in our clinic, the patient reports doing very well. Very pleased with flecainide.  "it has been a life saver".  AF has resolved. + mild swelling on dorsum of her R foot. Today, she denies symptoms of palpitations, chest pain, shortness of breath, dizziness, presyncope, or syncope.  The patient is otherwise without complaint today.   Past Medical History:  Diagnosis Date  . Arthritis   . Brain tumor ([place])   . Essential hypertension    a. 10/2011 Echo: EF >55%; b. 04/2014 Echo: EF 60-65%, triv AI, mildly dil RA.  Marland Kitchen Hypothyroidism   . Meningioma ([place])   . Palpitations    a. 10/2011 Cardionet; b. 02/2014 Cardionet: sinus rhythm, occas unifocal isolated PVC's, no signif brady/tachy.  . Pedal edema   . Sleep apnea 04/22/08   SLEEP STUDY done @ [facility]. Read by Dr. Keturah Barre. Repeat study done 05/22/11 @ [facility].   Past Surgical History:  Procedure Laterality Date  . APPENDECTOMY    . TONSILLECTOMY    . TUBAL LIGATION      ROS- all systems are reviewed and negatives except as per HPI above  Current Outpatient Medications  Medication Sig Dispense Refill  . CRANBERRY PO Take 1 capsule by mouth daily.    . flecainide (TAMBOCOR) 50 MG tablet Take 1 tablet (50 mg total) by mouth 2 (two) times daily. 60 tablet 3  . hydrALAZINE (APRESOLINE) 25 MG tablet Take 1 tablet (25 mg total) by mouth 3 (three) times daily. 360 tablet 3  . levothyroxine (SYNTHROID, LEVOTHROID) 100 MCG tablet Take 1 tablet by mouth every morning.  1  . lisinopril (PRINIVIL,ZESTRIL) 20 MG tablet Take 20 mg by mouth at bedtime.    Marland Kitchen lisinopril-hydrochlorothiazide (PRINZIDE,ZESTORETIC) 20-12.5 MG tablet TAKE 1 TABLET BY MOUTH EVERY DAY 90 tablet 2  . LUTEIN-ZEAXANTHIN PO Take 1 tablet by mouth daily.    Marland Kitchen  MAGNESIUM PO Take 500 mg by mouth.    . Multiple Vitamin (MULTIVITAMIN WITH MINERALS) TABS tablet Take 1 tablet by mouth daily. Contains vitamin C    . Multiple Vitamins-Minerals (ALIVE ENERGY 50+ PO) Take by mouth.    . NON FORMULARY CPAP THERAPY    . omega-3 acid ethyl esters (LOVAZA) 1 G capsule Take 1 g by mouth daily.     . metoprolol succinate (TOPROL-XL) 100 MG 24 hr tablet Take 1 tablet (100 mg total) by mouth daily. Take with or immediately following a meal. 90 tablet 3  . nitroGLYCERIN (NITROSTAT) 0.4 MG SL tablet Place 1 tablet (0.4 mg total) under the tongue every 5 (five) minutes as needed for chest pain. 25 tablet 3   No current facility-administered medications for this visit.     Physical Exam: Vitals:   11/09/17 1446  BP: 126/76  Pulse: (!) 48  SpO2: 92%  Weight: 189 lb 12.8 oz (86.1 kg)  Height: 5' (1.524 m)    GEN- The patient is well appearing, alert and oriented x 3 today.   Head- normocephalic, atraumatic Eyes-  Sclera clear, conjunctiva pink Ears- hearing intact Oropharynx- clear Lungs- Clear to ausculation bilaterally, normal work of breathing Heart- Regular rate and rhythm, no murmurs, rubs or gallops, PMI not laterally displaced GI- soft, NT, ND, + BS Extremities- no clubbing, cyanosis, or edema  Wt Readings from Last 3 Encounters:  11/09/17 189 lb 12.8 oz (86.1 kg)  10/08/17 189 lb (85.7 kg)  08/23/17 190 lb (86.2 kg)    EKG tracing ordered today is personally reviewed and shows sinus rhythm 48 bpm, PR 212 msec, otherwise normal ekg  Assessment and Plan:  1. Paroxysmal atrial fibrillation chads2vasc score is 4.  She has declined AAD therapy Recently started on flecainide Given bradycardia, will reduce toprol to 50mg  daily.  May need to consider reducing further if bradycardia persists.  2. OSA Followed by Dr Claiborne Billings  3. HTN Stable No change required today  Follow-up in AF clinic in 2 months and then with Drs Claiborne Billings and [facility] I am happy  to see as needed.  Thompson Grayer MD, [facility] 11/09/2017 3:16 PM

## 2017-11-09 NOTE — Patient Instructions (Addendum)
Medication Instructions:  Your physician has recommended you make the following change in your medication:   Decrease your Torprol-XL to 50mg , half tablet, once per day  Labwork: None ordered.  Testing/Procedures: None ordered.  Follow-Up: Your physician recommends that you schedule a follow-up appointment in:   October 30th at 2pm with Roderic Palau, NP   Any Other Special Instructions Will Be Listed Below (If Applicable).     If you need a refill on your cardiac medications before your next appointment, please call your pharmacy.

## 2017-11-13 DIAGNOSIS — E785 Hyperlipidemia, unspecified: Secondary | ICD-10-CM | POA: Diagnosis not present

## 2017-11-13 DIAGNOSIS — Z79899 Other long term (current) drug therapy: Secondary | ICD-10-CM | POA: Diagnosis not present

## 2017-11-14 LAB — COMPREHENSIVE METABOLIC PANEL
A/G RATIO: 1.5 (ref 1.2–2.2)
ALBUMIN: 4.3 g/dL (ref 3.5–4.7)
ALK PHOS: 72 IU/L (ref 39–117)
ALT: 15 IU/L (ref 0–32)
AST: 33 IU/L (ref 0–40)
BILIRUBIN TOTAL: 0.5 mg/dL (ref 0.0–1.2)
BUN/Creatinine Ratio: 27 (ref 12–28)
BUN: 21 mg/dL (ref 8–27)
CHLORIDE: 98 mmol/L (ref 96–106)
CO2: 27 mmol/L (ref 20–29)
Calcium: 10.4 mg/dL — ABNORMAL HIGH (ref 8.7–10.3)
Creatinine, Ser: 0.77 mg/dL (ref 0.57–1.00)
GFR calc Af Amer: 81 mL/min/{1.73_m2} (ref >59)
GFR calc non Af Amer: 71 mL/min/{1.73_m2} (ref >59)
GLUCOSE: 86 mg/dL (ref 65–99)
Globulin, Total: 2.8 g/dL (ref 1.5–4.5)
POTASSIUM: 4.9 mmol/L (ref 3.5–5.2)
SODIUM: 141 mmol/L (ref 134–144)
TOTAL PROTEIN: 7.1 g/dL (ref 6.0–8.5)

## 2017-11-14 LAB — LIPID PANEL
CHOLESTEROL TOTAL: 239 mg/dL — AB (ref 100–199)
Chol/HDL Ratio: 3.7 ratio (ref 0.0–4.4)
HDL: 65 mg/dL (ref >39)
LDL Calculated: 155 mg/dL — ABNORMAL HIGH (ref 0–99)
Triglycerides: 96 mg/dL (ref 0–149)
VLDL Cholesterol Cal: 19 mg/dL (ref 5–40)

## 2017-11-15 ENCOUNTER — Ambulatory Visit: Payer: Medicare Other | Admitting: Cardiovascular Disease

## 2017-11-15 ENCOUNTER — Encounter: Payer: Self-pay | Admitting: Cardiovascular Disease

## 2017-11-15 VITALS — BP 143/65 | HR 52 | Ht 60.0 in | Wt 187.6 lb

## 2017-11-15 DIAGNOSIS — Z79899 Other long term (current) drug therapy: Secondary | ICD-10-CM

## 2017-11-15 DIAGNOSIS — G4733 Obstructive sleep apnea (adult) (pediatric): Secondary | ICD-10-CM | POA: Diagnosis not present

## 2017-11-15 DIAGNOSIS — Z9989 Dependence on other enabling machines and devices: Secondary | ICD-10-CM

## 2017-11-15 DIAGNOSIS — I10 Essential (primary) hypertension: Secondary | ICD-10-CM

## 2017-11-15 DIAGNOSIS — E669 Obesity, unspecified: Secondary | ICD-10-CM

## 2017-11-15 DIAGNOSIS — I480 Paroxysmal atrial fibrillation: Secondary | ICD-10-CM | POA: Diagnosis not present

## 2017-11-15 MED ORDER — EZETIMIBE 10 MG PO TABS: 10.0000 mg | ORAL_TABLET | Freq: Every day | ORAL | 3 refills | Status: DC

## 2017-11-15 NOTE — Patient Instructions (Signed)
Medication Instructions:  START Zetia 10 mg daily  Labwork: Please return for FASTING labs in 3 months (CMET, Lipid)  Our in office lab hours are Monday-Friday 8:00-4:00, closed for lunch 12:45-1:45 pm.  No appointment needed.  Follow-Up: Your physician wants you to follow-up in: 4 months with Dr. Claiborne Billings. You will receive a reminder letter in the mail two months in advance. If you don't receive a letter, please call our office to schedule the follow-up appointment.   Any Other Special Instructions Will Be Listed Below (If Applicable).     If you need a refill on your cardiac medications before your next appointment, please call your pharmacy.

## 2017-11-15 NOTE — Progress Notes (Signed)
Patient ID: SMRITHI PIGFORD, female   DOB: [birth year], 84 y.o.   MRN: [id number]     HPI: Brianna Reyes is a 84 y.o. female who presents to the office today for a 7 month cardiology/sleep followup evaluation.   Ms. Economos has a history of hypertension, palpitations, hypothyroidism, hyperlipidemia, as well as complex sleep apnea. She has documented mild LVH with grade 2 diastolic dysfunction. She does a history of previous  labile blood pressure at home a bit in the past.  These have ranged from 802 to 233 systolically.  Recently, her medications have been titrated says she now is taking metoprolol 75 mg twice a day, hydralazine, 50 mg 3 times a day, and she takes a total dose of lisinopril 40 mg and HCTZ 12.5 mg daily.    She was seen in the office by Lesia Hausen on 02/26/2014 with complaints of increasing palpitations.  A CardioNet monitor was performed.  I reviewed this, which recorded her rhythm from the summer 17 2015 through 03/11/2014.  She had sinus rhythm.  There were occasional unifocal isolated PVCs.  There were no episodes of significant bradycardia or tachycardia.  She denies recent chest pain.  When I last saw her in January 2016, I increased her beta blocker therapy.  2.  Metoprolol 100 mg in the morning and 75 mg in the evening.  This has resulted in significant improvement in her palpitations.  In light of her cardiac murmur.  I recommended a three-year follow-up echo Doppler study.  This was done on 04/13/2014 and showed normal systolic function with an EF of 60-65%.  Aortic valve was mildly thickened but there was no stenosis.  She had trivial aortic and mitral insufficiency.  Laboratory in January revealed a normal hemoglobin and hematocrit.  She has normal renal function.  She has complex sleep apnea and has required BiPAP therapy or BiPAP pressure of 22 and an EPAP pressure of 17. She uses it 100% of the time.  She feels that sleep is sleeping much better.  She is also concerned that  she may at times.  Still be stopping breathing.  When I saw her in January.  She could not recall recall when her last time her unit was downloaded.  A download was obtained from March 9 through 06/18/2014.  This showed 100% compliance.  She was sleeping 9 hours and 27 minutes at night.  Her average device IPAP pressure greater than 90% of the time was 18.9 with a maximum titrated IPAP pressure of 23.7.  Her maximum titrated EPAP pressure was 21.1.  Her average device CPAP pressure was 16.8.  AHI was still increased at 16.1.  Of note, her EPAP starting pressure was 10.  She was continuing to have obstructive apneas, occasional hypotony is, and has been demonstrated to have central apneas.  We had recommended an adaptive servo ventilation titration but she did not pursue this and initially wanted adjustment to her current BiPAP unit.  I have not seen her since 2016.  She has continued to use BiPAP auto.  I interrogated her machine, which reveals a minimum EPAP pressure of 12 with maximum IPAP up to 25 CWP.  She is on has a bi-Flex of 3.  Over the past 30 days.  She has been averaging 7 hours and 24 minutes of sleep.  Her AHI was 6.2.  95th percent pressure was 15.4/13.2.  When I last saw her in February 2019 her major complaint was blood pressure lability.  She apparently is been taking lisinopril HCT 20/12.5 and a morning and lisinopril 20 mg in the evening.  She takes hydralazine 50 mg once a day and has been taking 75 mg of metoprolol in the morning and 100 mg in the evening.  She has a history of PAF and presented to the emergency room in September 2018.  She presented to her dentist office with hypertensive urgency a new atrial fibrillation.  His treated with metoprolol and eliquis was recommended.  However, the patient states she never started taking the eliquis.  She has not been taking any aspirin.   Since I saw her, she has had follow-up evaluations with how main, and was seen in atrial fibrillation  clinic June 2019 with complaints of palpitations at night.  The previous monitor had shown PVCs.  She subsequently was seen by Drs. Camnitz and Dr. Rayann Heman was recently started on flecainide 50 mg twice a day.  She is no longer taking hydralazine but continues to take lisinopril HCT 20/12 point 5 in the morning and lisinopril 20 mg daily at bedtime.  She also is on Toprol-XL 50 mg daily.  She has been drinking beet juice with lemon and believe this is been very beneficial.  She continues to be on BiPAP.  She presents for reevaluation.   Past Medical History:  Diagnosis Date  . Arthritis   . Brain tumor ([place])   . Essential hypertension    a. 10/2011 Echo: EF >55%; b. 04/2014 Echo: EF 60-65%, triv AI, mildly dil RA.  Marland Kitchen Hypothyroidism   . Meningioma ([place])   . Palpitations    a. 10/2011 Cardionet; b. 02/2014 Cardionet: sinus rhythm, occas unifocal isolated PVC's, no signif brady/tachy.  . Pedal edema   . Sleep apnea 04/22/08   SLEEP STUDY done @ [facility]. Read by Dr. Keturah Barre. Repeat study done 05/22/11 @ [facility].    Past Surgical History:  Procedure Laterality Date  . APPENDECTOMY    . TONSILLECTOMY    . TUBAL LIGATION      Allergies  Allergen Reactions  . Other Other (See Comments)    CORTISONE INJECTION; severe dizziness and made her very sick; pt states "it about killed me"    Current Outpatient Medications  Medication Sig Dispense Refill  . CRANBERRY PO Take 1 capsule by mouth daily.    . flecainide (TAMBOCOR) 50 MG tablet Take 1 tablet (50 mg total) by mouth 2 (two) times daily. 60 tablet 3  . hydrALAZINE (APRESOLINE) 25 MG tablet Take 1 tablet (25 mg total) by mouth 3 (three) times daily. 360 tablet 3  . levothyroxine (SYNTHROID, LEVOTHROID) 100 MCG tablet Take 1 tablet by mouth every morning.  1  . lisinopril (PRINIVIL,ZESTRIL) 20 MG tablet Take 20 mg by mouth at bedtime.    Marland Kitchen lisinopril-hydrochlorothiazide (PRINZIDE,ZESTORETIC) 20-12.5 MG tablet TAKE  1 TABLET BY MOUTH EVERY DAY 90 tablet 2  . LUTEIN-ZEAXANTHIN PO Take 1 tablet by mouth daily.    Marland Kitchen MAGNESIUM PO Take 500 mg by mouth.    . metoprolol succinate (TOPROL-XL) 50 MG 24 hr tablet Take 1 tablet (50 mg total) by mouth daily. Take with or immediately following a meal. 90 tablet 3  . Multiple Vitamin (MULTIVITAMIN WITH MINERALS) TABS tablet Take 1 tablet by mouth daily. Contains vitamin C    . Multiple Vitamins-Minerals (ALIVE ENERGY 50+ PO) Take by mouth.    . NON FORMULARY CPAP THERAPY    . omega-3 acid ethyl esters (  LOVAZA) 1 G capsule Take 1 g by mouth daily.     Marland Kitchen ezetimibe (ZETIA) 10 MG tablet Take 1 tablet (10 mg total) by mouth daily. 90 tablet 3  . nitroGLYCERIN (NITROSTAT) 0.4 MG SL tablet Place 1 tablet (0.4 mg total) under the tongue every 5 (five) minutes as needed for chest pain. 25 tablet 3   No current facility-administered medications for this visit.     Social History   Socioeconomic History  . Marital status: Single    Spouse name: Not on file  . Number of children: Not on file  . Years of education: Not on file  . Highest education level: Not on file  Occupational History  . Not on file  Social Needs  . Financial resource strain: Not on file  . Food insecurity:    Worry: Not on file    Inability: Not on file  . Transportation needs:    Medical: Not on file    Non-medical: Not on file  Tobacco Use  . Smoking status: Never Smoker  . Smokeless tobacco: Never Used  Substance and Sexual Activity  . Alcohol use: No    Alcohol/week: 0.0 standard drinks  . Drug use: No  . Sexual activity: Never  Lifestyle  . Physical activity:    Days per week: Not on file    Minutes per session: Not on file  . Stress: Not on file  Relationships  . Social connections:    Talks on phone: Not on file    Gets together: Not on file    Attends religious service: Not on file    Active member of club or organization: Not on file    Attends meetings of clubs or  organizations: Not on file    Relationship status: Not on file  . Intimate partner violence:    Fear of current or ex partner: Not on file    Emotionally abused: Not on file    Physically abused: Not on file    Forced sexual activity: Not on file  Other Topics Concern  . Not on file  Social History Narrative  . Not on file    Family History  Problem Relation Age of Onset  . Heart disease Brother        ischemic  . Hypertension Unknown   . Stroke Unknown    ROS General: Negative; No fevers, chills, or night sweats;  HEENT: Negative; No changes in vision or hearing, sinus congestion, difficulty swallowing Pulmonary: Negative; No cough, wheezing, shortness of breath, hemoptysis Cardiovascular: see HPI GI: Negative; No nausea, vomiting, diarrhea, or abdominal pain GU: Negative; No dysuria, hematuria, or difficulty voiding Musculoskeletal: Negative; no myalgias, joint pain, or weakness Hematologic/Oncology: Positive for hypothyroidism; no easy bruising, bleeding Endocrine: Negative; no heat/cold intolerance Neuro: Negative; no changes in balance, headaches Skin: Negative; No rashes or skin lesions Psychiatric: Negative; No behavioral problems, depression Sleep: Positive for complex, sleep apnea, currently on BiPAP therapy.  She is unaware of any breakthrough  snoring, daytime sleepiness, hypersomnolence, bruxism, restless legs, hypnogognic hallucinations, no cataplexy Other comprehensive 14 point system review is negative   Physical Exam BP (!) 143/65   Pulse (!) 52   Ht 5' (1.524 m)   Wt 187 lb 9.6 oz (85.1 kg)   BMI 36.64 kg/m    Repeat blood pressure by me 130/70  Wt Readings from Last 3 Encounters:  11/15/17 187 lb 9.6 oz (85.1 kg)  11/09/17 189 lb 12.8 oz (86.1 kg)  10/08/17  189 lb (85.7 kg)   General: Alert, oriented, no distress.  Skin: normal turgor, no rashes, warm and dry HEENT: Normocephalic, atraumatic. Pupils equal round and reactive to light; sclera  anicteric; extraocular muscles intact;  Nose without nasal septal hypertrophy Mouth/Parynx benign; Mallinpatti scale 3 Neck: No JVD, no carotid bruits; normal carotid upstroke Lungs: clear to ausculatation and percussion; no wheezing or rales Chest wall: without tenderness to palpitation Heart: PMI not displaced, RRR, s1 s2 normal, 1/6 systolic murmur, no diastolic murmur, no rubs, gallops, thrills, or heaves Abdomen: soft, nontender; no hepatosplenomehaly, BS+; abdominal aorta nontender and not dilated by palpation. Back: no CVA tenderness Pulses 2+ Musculoskeletal: full range of motion, normal strength, no joint deformities Extremities: no clubbing cyanosis or edema, Homan's sign negative  Neurologic: grossly nonfocal; Cranial nerves grossly wnl Psychologic: Normal mood and affect  ECG (independently read by me): Sinus bradycardia at 52 bpm.  First-degree AV block with appeared to 14 ms.  QTc normal at 411 ms.  February 2019 ECG (independently read by me): Sinus rhythm at 53 bpm.  April 2016 ECG (independently read by me): Sinus bradycardia 51 bpm.  No ectopy.  Normal intervals.  January 2016 ECG (independently read by me): Normal sinus rhythm at 62 bpm.  No ectopy.  QTc interval 414 ms.  May 2015 ECG (independently read by me): sinus bradycardia 57 beats per minute.  No ectopy.  Prior ECG : Sinus rhythm at 68 beats per minute. No ectopy per  LABS:   BMP Latest Ref Rng & Units 11/13/2017 07/03/2017 11/29/2016  Glucose 65 - 99 mg/dL 86 91 94  BUN 8 - 27 mg/dL 21 19 15   Creatinine 0.57 - 1.00 mg/dL 0.77 0.77 0.78  BUN/Creat Ratio 12 - 28 27 25  -  Sodium 134 - 144 mmol/L 141 139 137  Potassium 3.5 - 5.2 mmol/L 4.9 4.3 4.7  Chloride 96 - 106 mmol/L 98 98 101  CO2 20 - 29 mmol/L 27 26 25   Calcium 8.7 - 10.3 mg/dL 10.4(H) 10.1 10.0    Hepatic Function Latest Ref Rng & Units 11/13/2017 07/03/2017 03/26/2014  Total Protein 6.0 - 8.5 g/dL 7.1 7.0 6.8  Albumin 3.5 - 4.7 g/dL 4.3 4.5 3.8    AST 0 - 40 IU/L 33 26 21  ALT 0 - 32 IU/L 15 17 14   Alk Phosphatase 39 - 117 IU/L 72 71 59  Total Bilirubin 0.0 - 1.2 mg/dL 0.5 0.4 0.6    CBC Latest Ref Rng & Units 05/04/2017 11/29/2016 04/05/2014  WBC 3.4 - 10.8 x10E3/uL 5.6 6.2 6.9  Hemoglobin 11.1 - 15.9 g/dL 12.4 13.0 13.5  Hematocrit 34.0 - 46.6 % 39.2 40.9 40.4  Platelets 150 - 379 x10E3/uL 259 282 243   Lab Results  Component Value Date   TSH 3.400 05/04/2017   BNP No results found for: PROBNP   Lipid Panel     Component Value Date/Time   CHOL 239 (H) 11/13/2017 1354   TRIG 96 11/13/2017 1354   HDL 65 11/13/2017 1354   CHOLHDL 3.7 11/13/2017 1354   CHOLHDL 4.0 03/26/2014 1023   VLDL 19 03/26/2014 1023   LDLCALC 155 (H) 11/13/2017 1354     RADIOLOGY: No results found.  IMPRESSION:  1. Medication management   2. PAF (paroxysmal atrial fibrillation) ([place])   3. OSA on BiPAP   4. Essential hypertension   5. Obesity (BMI 30-39.9)     ASSESSMENT AND PLAN: Ms. Mitali Shenefield is an 84 year old female with a history  of labile hypertension, palpitations, hyperlipidemia, obesity, and complex sleep apnea.  An echo Doppler study in 2013 showed normal systolic function with suggestive of grade 2 diastolic dysfunction. She had moderate LA dilatation, mitral annular calcification,aortic valve sclerosis with very mild stenosis.  Her blood pressure today is now well controlled on her current regimen consisting of lisinopril HCT 20/12.5 mg in the morning and lisinopril 20 mg at bedtime, Toprol-XL 50 mg daily.  She is no longer taking hydralazine.  She is maintaining sinus rhythm and is now on flecainide 50 mg twice a day without awareness of any recurrent atrial fibrillation.  Reviewed recent laboratory.  Her total cholesterol was 239 LDL cholesterol 155.  She has agreed to try Zetia 10 mg daily to her regimen.  She apparently cannot take statin therapy.  She continues to use BiPAP therapy for her complex sleep apnea.  Obtain a new  download today from August 21 through November 14, 2017.  She is 100% compliant.  AHI was increased significantly and her minimum EPAP pressure was set at 4 with a maximum IPAP of 25.  There was significant leak on a daily basis.  Her 95th percentile pressure was 13.4/19.5 with a maximum average pressure of 16.1/12.1.  I am recommending increasing her minimal pressure to 11/7 may be accounting for some of the difficulty that she has noticed when she initially puts the CPAP on that she may not be getting enough twinge.  We discussed assessment of cushion mask leak persists may require a different type of mask.  Follow-up laboratory will be obtained in 3 months.  I will see her in 4 months for reevaluation.  Time spent: 30 minutes Troy Sine, MD, [facility]  11/16/2017 1:43 PM

## 2017-11-16 ENCOUNTER — Encounter: Payer: Self-pay | Admitting: Cardiovascular Disease

## 2017-11-16 NOTE — Addendum Note (Signed)
Addended by: Leland Johns A on: 11/16/2017 03:49 PM   Modules accepted: Orders

## 2017-11-30 DIAGNOSIS — M19011 Primary osteoarthritis, right shoulder: Secondary | ICD-10-CM | POA: Diagnosis not present

## 2017-11-30 DIAGNOSIS — M7541 Impingement syndrome of right shoulder: Secondary | ICD-10-CM | POA: Diagnosis not present

## 2017-12-20 DIAGNOSIS — K429 Umbilical hernia without obstruction or gangrene: Secondary | ICD-10-CM | POA: Diagnosis not present

## 2018-01-03 DIAGNOSIS — K429 Umbilical hernia without obstruction or gangrene: Secondary | ICD-10-CM | POA: Diagnosis not present

## 2018-01-03 DIAGNOSIS — I10 Essential (primary) hypertension: Secondary | ICD-10-CM | POA: Diagnosis not present

## 2018-01-03 DIAGNOSIS — G4733 Obstructive sleep apnea (adult) (pediatric): Secondary | ICD-10-CM | POA: Diagnosis not present

## 2018-01-03 DIAGNOSIS — I480 Paroxysmal atrial fibrillation: Secondary | ICD-10-CM | POA: Diagnosis not present

## 2018-01-04 ENCOUNTER — Other Ambulatory Visit: Payer: Self-pay | Admitting: Physician Assistant

## 2018-01-09 ENCOUNTER — Ambulatory Visit (HOSPITAL_COMMUNITY): Payer: Medicare Other | Admitting: Nurse Practitioner

## 2018-02-06 DIAGNOSIS — E039 Hypothyroidism, unspecified: Secondary | ICD-10-CM | POA: Diagnosis not present

## 2018-02-06 DIAGNOSIS — I10 Essential (primary) hypertension: Secondary | ICD-10-CM | POA: Diagnosis not present

## 2018-02-13 DIAGNOSIS — M8580 Other specified disorders of bone density and structure, unspecified site: Secondary | ICD-10-CM | POA: Diagnosis not present

## 2018-02-13 DIAGNOSIS — K429 Umbilical hernia without obstruction or gangrene: Secondary | ICD-10-CM | POA: Diagnosis not present

## 2018-02-13 DIAGNOSIS — I10 Essential (primary) hypertension: Secondary | ICD-10-CM | POA: Diagnosis not present

## 2018-02-13 DIAGNOSIS — E039 Hypothyroidism, unspecified: Secondary | ICD-10-CM | POA: Diagnosis not present

## 2018-05-28 ENCOUNTER — Telehealth: Payer: Self-pay | Admitting: *Deleted

## 2018-05-28 NOTE — Telephone Encounter (Signed)
Patient was notified per Dr Claiborne Billings that she doesn't have to be seen to have her BIPAP pressures changed, however he cannot sign the handwritten note that she wants him to sign for medicare stating reasons she cannot have a in lab sleep study without office evaluation. Patient voiced understanding and asked for me to personally tell Dr Claiborne Billings that she really appreciates him for not making her come in and changing the pressures remotely because she was afraid to come in  the office due to fear of catching the coronovirus. I told her she will need to speak with Ivin Booty at [facility] in reference to them not picking up her loaner machine right now, At least until she can come in at a later date. We have no control over this. Patient voiced her understanding.

## 2018-05-30 ENCOUNTER — Ambulatory Visit: Payer: Medicare Other | Admitting: Cardiovascular Disease

## 2018-05-30 ENCOUNTER — Telehealth: Payer: Self-pay | Admitting: Cardiovascular Disease

## 2018-05-30 NOTE — Telephone Encounter (Signed)
New Message          Patient is needing a letter/note for her sleep study, she states Medicare will pay for it if she can get this letter/note. Patient would like a call concerning this matter.

## 2018-05-30 NOTE — Telephone Encounter (Signed)
Acknowledged.

## 2018-05-31 NOTE — Telephone Encounter (Signed)
Returned a call to patient again reminding her that per our phone conversation on 05/29/18 Dr Claiborne Billings cannot sign a note without seeing her first. Patient states that she had gotten a call from "them" and she didn't know whether he had changed his mind. I reinforced to her that she still has to be seen. I will speak with Ivin Booty at [facility] to see if they will hold off on picking up the current loaner machine until this coronvirus situation gets better and patient can come for appointment.

## 2018-06-05 DIAGNOSIS — R300 Dysuria: Secondary | ICD-10-CM | POA: Diagnosis not present

## 2018-06-05 DIAGNOSIS — N390 Urinary tract infection, site not specified: Secondary | ICD-10-CM | POA: Diagnosis not present

## 2018-06-18 ENCOUNTER — Telehealth: Payer: Self-pay | Admitting: Cardiovascular Disease

## 2018-06-18 NOTE — Telephone Encounter (Signed)
Pt called and asked to speak to Brianna Reyes directly.  Pt says Brianna Reyes is able to get the readings from her CPAP machine, and determine if the pt needs to make changes.  The pt said she got a new machine and wants to make sure she is using it properly.

## 2018-06-19 NOTE — Telephone Encounter (Signed)
Returned a call to patient to patient in reference to her sleep apnea. Patient was informed that she is still having 21 AHI/hr. Some which may be due to a high mask leak. She again asked me about a note for medicare so that [facility] won't pick up their loaner machine which she has had for 9 months. I again told her per Dr Evette Georges last recommendations that he needs to see her. She may require a ASV titration and will need to go back to the sleep . She seems to doesn't seem to want to understand that [facility] will be picking up their machine because they are not getting payment.patient requests having a virtual or telephone visit with Dr Claiborne Billings so she can start the process before [facility] takes away her BIPAP machine. I told her either myself or Almyra Free will call her with appointment day and time.

## 2018-06-25 NOTE — Telephone Encounter (Signed)
I have attempted to contact patient- it will ring once and hang up. I have spot available for Friday 04/17 for patient, but unable to reach.

## 2018-07-01 ENCOUNTER — Telehealth: Payer: Self-pay | Admitting: *Deleted

## 2018-07-01 NOTE — Telephone Encounter (Signed)
Virtual visit/phone/D.Loreto Loescher 

## 2018-07-29 ENCOUNTER — Other Ambulatory Visit: Payer: Self-pay | Admitting: Cardiovascular Disease

## 2018-09-11 ENCOUNTER — Telehealth: Payer: Self-pay | Admitting: Cardiovascular Disease

## 2018-09-11 NOTE — Telephone Encounter (Signed)
1. 3. Confirm consent - "In the setting of the current Covid19 crisis, you are scheduled for a (phone or video) visit with your provider on (date) at (time).  Just as we do with many in-office visits, in order for you to participate in this visit, we must obtain consent.  If you'd like, I can send this to your mychart (if signed up) or email for you to review.  Otherwise, I can obtain your verbal consent now.  All virtual visits are billed to your insurance company just like a normal visit would be.  By agreeing to a virtual visit, we'd like you to understand that the technology does not allow for your provider to perform an examination, and thus may limit your provider's ability to fully assess your condition. If your provider identifies any concerns that need to be evaluated in person, we will make arrangements to do so.  Finally, though the technology is pretty good, we cannot assure that it will always work on either your or our end, and in the setting of a video visit, we may have to convert it to a phone-only visit.  In either situation, we cannot ensure that we have a secure connection.  Are you willing to proceed?" STAFF: Did the patient verbally acknowledge consent to telehealth visit? Document YES/NO here-: yes     FULL LENGTH CONSENT FOR TELE-HEALTH VISIT   I hereby voluntarily request, consent and authorize [facility] and its employed or contracted physicians, physician assistants, nurse practitioners or other licensed health care professionals (the Practitioner), to provide me with telemedicine health care services (the Services") as deemed necessary by the treating Practitioner. I acknowledge and consent to receive the Services by the Practitioner via telemedicine. I understand that the telemedicine visit will involve communicating with the Practitioner through live audiovisual communication technology and the disclosure of certain medical information by electronic transmission. I  acknowledge that I have been given the opportunity to request an in-person assessment or other available alternative prior to the telemedicine visit and am voluntarily participating in the telemedicine visit.  I understand that I have the right to withhold or withdraw my consent to the use of telemedicine in the course of my care at any time, without affecting my right to future care or treatment, and that the Practitioner or I may terminate the telemedicine visit at any time. I understand that I have the right to inspect all information obtained and/or recorded in the course of the telemedicine visit and may receive copies of available information for a reasonable fee.  I understand that some of the potential risks of receiving the Services via telemedicine include:   Delay or interruption in medical evaluation due to technological equipment failure or disruption;  Information transmitted may not be sufficient (e.g. poor resolution of images) to allow for appropriate medical decision making by the Practitioner; and/or   In rare instances, security protocols could fail, causing a breach of personal health information.  Furthermore, I acknowledge that it is my responsibility to provide information about my medical history, conditions and care that is complete and accurate to the best of my ability. I acknowledge that Practitioner's advice, recommendations, and/or decision may be based on factors not within their control, such as incomplete or inaccurate data provided by me or distortions of diagnostic images or specimens that may result from electronic transmissions. I understand that the practice of medicine is not an exact science and that Practitioner makes no warranties or guarantees regarding  treatment outcomes. I acknowledge that I will receive a copy of this consent concurrently upon execution via email to the email address I last provided but may also request a printed copy by calling the office of  [place].    I understand that my insurance will be billed for this visit.   I have read or had this consent read to me.  I understand the contents of this consent, which adequately explains the benefits and risks of the Services being provided via telemedicine.   I have been provided ample opportunity to ask questions regarding this consent and the Services and have had my questions answered to my satisfaction.  I give my informed consent for the services to be provided through the use of telemedicine in my medical care  By participating in this telemedicine visit I agree to the above.

## 2018-09-12 ENCOUNTER — Telehealth (INDEPENDENT_AMBULATORY_CARE_PROVIDER_SITE_OTHER): Payer: Medicare Other | Admitting: Cardiovascular Disease

## 2018-09-12 ENCOUNTER — Encounter: Payer: Self-pay | Admitting: Cardiovascular Disease

## 2018-09-12 VITALS — BP 133/67 | HR 65 | Ht 61.0 in | Wt 189.0 lb

## 2018-09-12 DIAGNOSIS — I480 Paroxysmal atrial fibrillation: Secondary | ICD-10-CM

## 2018-09-12 DIAGNOSIS — E785 Hyperlipidemia, unspecified: Secondary | ICD-10-CM

## 2018-09-12 DIAGNOSIS — Z9989 Dependence on other enabling machines and devices: Secondary | ICD-10-CM

## 2018-09-12 DIAGNOSIS — E039 Hypothyroidism, unspecified: Secondary | ICD-10-CM | POA: Diagnosis not present

## 2018-09-12 DIAGNOSIS — G4733 Obstructive sleep apnea (adult) (pediatric): Secondary | ICD-10-CM

## 2018-09-12 DIAGNOSIS — I10 Essential (primary) hypertension: Secondary | ICD-10-CM

## 2018-09-12 MED ORDER — HYDRALAZINE HCL 25 MG PO TABS: 25.0000 mg | ORAL_TABLET | Freq: Two times a day (BID) | ORAL | 3 refills | Status: DC

## 2018-09-12 NOTE — Progress Notes (Signed)
Virtual Visit via Telephone Note   This visit type was conducted due to national recommendations for restrictions regarding the COVID-19 Pandemic (e.g. social distancing) in an effort to limit this patient's exposure and mitigate transmission in our community.  Due to her co-morbid illnesses, this patient is at least at moderate risk for complications without adequate follow up.  This format is felt to be most appropriate for this patient at this time.  The patient did not have access to video technology/had technical difficulties with video requiring transitioning to audio format only (telephone).  All issues noted in this document were discussed and addressed.  No physical exam could be performed with this format.  Please refer to the patient's chart for her  consent to telehealth for [facility].   Date:  09/12/2018   ID:  Brianna Reyes, DOB April 19, 1930, MRN [id number]  Patient Location: Home Provider Location: Office  PCP:  Deland Pretty, MD  Cardiologist:  Shelva Majestic, MD Electrophysiologist:  None   Evaluation Performed:  Follow-Up Visit  Chief Complaint: 44-month follow-up cardiology and sleep evaluation  History of Present Illness:    Brianna Reyes is a 85 y.o. female who has a history of hypertension, palpitations, hypothyroidism, hyperlipidemia, as well as complex sleep apnea. She has documented mild LVH with grade 2 diastolic dysfunction. She does a history of previous  labile blood pressure at home a bit in the past.  These have ranged from 536 to 644 systolically.  Recently, her medications have been titrated says she now is taking metoprolol 75 mg twice a day, hydralazine, 50 mg 3 times a day, and she takes a total dose of lisinopril 40 mg and HCTZ 12.5 mg daily.    She was seen in the office by Lesia Hausen on 02/26/2014 with complaints of increasing palpitations.  A CardioNet monitor was performed.  I reviewed this, which recorded her rhythm from the summer 17 2015  through 03/11/2014.  She had sinus rhythm.  There were occasional unifocal isolated PVCs.  There were no episodes of significant bradycardia or tachycardia.  She denies recent chest pain.  When I last saw her in January 2016, I increased her beta blocker therapy.  2.  Metoprolol 100 mg in the morning and 75 mg in the evening.  This has resulted in significant improvement in her palpitations.  In light of her cardiac murmur.  I recommended a three-year follow-up echo Doppler study.  This was done on 04/13/2014 and showed normal systolic function with an EF of 60-65%.  Aortic valve was mildly thickened but there was no stenosis.  She had trivial aortic and mitral insufficiency.  Laboratory in January revealed a normal hemoglobin and hematocrit.  She has normal renal function.  She has complex sleep apnea and has required BiPAP therapy or BiPAP pressure of 22 and an EPAP pressure of 17. She uses it 100% of the time.  She feels that sleep is sleeping much better.  She is also concerned that she may at times.  Still be stopping breathing.  When I saw her in January.  She could not recall recall when her last time her unit was downloaded.  A download was obtained from March 9 through 06/18/2014.  This showed 100% compliance.  She was sleeping 9 hours and 27 minutes at night.  Her average device IPAP pressure greater than 90% of the time was 18.9 with a maximum titrated IPAP pressure of 23.7.  Her maximum titrated EPAP pressure was 21.1.  Her average device CPAP pressure was 16.8.  AHI was still increased at 16.1.  Of note, her EPAP starting pressure was 10.  She was continuing to have obstructive apneas, occasional hypotony is, and has been demonstrated to have central apneas.  We had recommended an adaptive servo ventilation titration but she did not pursue this and initially wanted adjustment to her current BiPAP unit.  When I saw her in February 2019 I had not seen her since 2016. She was continuing  to use BiPAP  auto.  I interrogated her machine, which reveals a minimum EPAP pressure of 12 with maximum IPAP up to 25 CWP.  She is on has a bi-Flex of 3.  Over the past 30 days.  She has been averaging 7 hours and 24 minutes of sleep.  Her AHI was 6.2.  95th percent pressure was 15.4/13.2.  During her May 01, 2017 evaluation her major complaint was blood pressure lability.  She wqs taking lisinopril HCT 20/12.5 and a morning and lisinopril 20 mg in the evening, hydralazine 50 mg once a day and metoprolol 75 mg in the morning and 100 mg in the evening.  She has a history of PAF and presented to the emergency room in September 2018.  She presented to her dentist office with hypertensive urgency a new atrial fibrillation. She was treated with metoprolol and eliquis was recommended.  However, the patient states she never started taking the eliquis.  She has not been taking any aspirin.   She has had follow-up evaluations with Almyra Deforest, [facility], and was seen in atrial fibrillation clinic June 2019 with complaints of palpitations at night.  The previous monitor had shown PVCs.  She subsequently saw by Drs. Camnitz and Dr. Rayann Heman was started on flecainide 50 mg twice a day.  She is no longer taking hydralazine but continues to take lisinopril HCT 20/12 point 5 in the morning and lisinopril 20 mg daily at bedtime.  She also is on Toprol-XL 50 mg daily.  I last saw her in September 2019.  She was maintaining sinus rhythm on flecainide.  She continued to use BiPAP therapy and her download revealed 100% compliance.  AHI was increased and her minimum EPAP pressure was set at 4 with maximum IPAP pressure of 25.  Her 95th percentile pressure was 13.4/9.5 and maximum average pressure 16.1/12.1.  I increased her minimal pressure to 11/7.  She has been at home during this COVID-19 pandemic.  She denies any awareness of recurrent atrial fibrillation.  Apparently her blood pressures have been in the 137/67 range but she essentially has  only been taking hydralazine once and takes all her medications and the latter afternoon.  I obtained a new download in the office today from June 2 through September 11, 2018 which again shows 100% compliance.  Her minimum EPAP pressure of 12, pressure support 4 and maximum IPAP 25 with her 95th percentile pressure at 17/13 and maximum pressure 18.3/14.3.  She does have significant mask leak.  She presents for this telemedicine visit.  The patient does not have symptoms concerning for COVID-19 infection (fever, chills, cough, or new shortness of breath).    Past Medical History:  Diagnosis Date   Arthritis    Brain tumor [facility])    Essential hypertension    a. 10/2011 Echo: EF >55%; b. 04/2014 Echo: EF 60-65%, triv AI, mildly dil RA.   Hypothyroidism    Meningioma ([place])    Palpitations    a. 10/2011 Cardionet; b.  02/2014 Cardionet: sinus rhythm, occas unifocal isolated PVC's, no signif brady/tachy.   Pedal edema    Sleep apnea 04/22/08   SLEEP STUDY done @ [facility]. Read by Dr. Keturah Barre. Repeat study done 05/22/11 @ [facility].   Past Surgical History:  Procedure Laterality Date   APPENDECTOMY     TONSILLECTOMY     TUBAL LIGATION       Current Meds  Medication Sig   CRANBERRY PO Take 1 capsule by mouth daily.   flecainide (TAMBOCOR) 50 MG tablet TAKE 1 TABLET BY MOUTH TWICE A DAY   hydrALAZINE (APRESOLINE) 25 MG tablet Take 1 tablet (25 mg total) by mouth 3 (three) times daily.   levothyroxine (SYNTHROID, LEVOTHROID) 100 MCG tablet Take 1 tablet by mouth every morning.   lisinopril-hydrochlorothiazide (ZESTORETIC) 20-12.5 MG tablet TAKE 1 TABLET BY MOUTH EVERY DAY   LUTEIN-ZEAXANTHIN PO Take 1 tablet by mouth daily.   MAGNESIUM PO Take 500 mg by mouth.   metoprolol succinate (TOPROL-XL) 50 MG 24 hr tablet Take 1 tablet (50 mg total) by mouth daily. Take with or immediately following a meal.   Multiple Vitamin (MULTIVITAMIN WITH MINERALS) TABS  tablet Take 1 tablet by mouth daily. Contains vitamin C   Multiple Vitamins-Minerals (ALIVE ENERGY 50+ PO) Take by mouth.   nitroGLYCERIN (NITROSTAT) 0.4 MG SL tablet Place 1 tablet (0.4 mg total) under the tongue every 5 (five) minutes as needed for chest pain.   NON FORMULARY CPAP THERAPY   omega-3 acid ethyl esters (LOVAZA) 1 G capsule Take 1 g by mouth daily.      Allergies:   Other   Social History   Tobacco Use   Smoking status: Never Smoker   Smokeless tobacco: Never Used  Substance Use Topics   Alcohol use: No    Alcohol/week: 0.0 standard drinks   Drug use: No     Family Hx: The patient's family history includes Heart disease in her brother; Hypertension in her unknown relative; Stroke in her unknown relative.  ROS:   Please see the history of present illness.    No fevers chills night sweats No wheezing No cough No awareness of recurrent atrial fibrillation Using CPAP with 100% compliance No edema All other systems reviewed and are negative.   Prior CV studies:   The following studies were reviewed today:  I obtained a new download in the office today from June 2 through September 11, 2018.  100% compliant.  AHI remains elevated at 24.0 with an apnea index of 23.3 and central index 2.3.  Labs/Other Tests and Data Reviewed:    EKG:  An ECG dated 11/15/2017 was personally reviewed today and demonstrated:  Sinus bradycardia 52, first-degree AV block.  Normal QTc interval  Recent Labs: 11/13/2017: ALT 15; BUN 21; Creatinine, Ser 0.77; Potassium 4.9; Sodium 141   Recent Lipid Panel Lab Results  Component Value Date/Time   CHOL 239 (H) 11/13/2017 01:54 PM   TRIG 96 11/13/2017 01:54 PM   HDL 65 11/13/2017 01:54 PM   CHOLHDL 3.7 11/13/2017 01:54 PM   CHOLHDL 4.0 03/26/2014 10:23 AM   LDLCALC 155 (H) 11/13/2017 01:54 PM    Wt Readings from Last 3 Encounters:  09/12/18 189 lb (85.7 kg)  11/15/17 187 lb 9.6 oz (85.1 kg)  11/09/17 189 lb 12.8 oz (86.1 kg)      Objective:    Vital Signs:  BP 133/67    Pulse 65    Ht 5\' 1"  (  1.549 m)    Wt 189 lb (85.7 kg)    BMI 35.71 kg/m    Since this was a phone visit I could not perform a complete physical exam. However, breathing was normal and not labored There was no audible wheezing Her heart rate was normal and regular with a pulse of 65 She denied any chest tightness she denied any chest wall tenderness she denied any abdominal tenderness She denied any leg swelling She had a normal mood and affect   ASSESSMENT & PLAN:    1. Essential hypertension: She has been on lisinopril HCT 20/12.5, Toprol-XL 50 mg daily and was supposed to be taking hydralazine 25 mg every 8 hours.  Apparently she has been taking all of her medicines and the latter portion of the afternoon.  She has only been taking hydralazine once a day.  I have suggested that she take hydralazine 25 mg twice a day which will provide her improve coverage of her blood pressure which becomes elevated in the latter portion of the day since she has not been taking any morning medication. 2. PAF: Maintaining sinus rhythm on flecainide 50 mg twice a day in addition to Toprol-XL 50 mg daily.  No episodes of tachycardia.  Apparently she had refused anticoagulation therapy 3. Complex sleep apnea: She continues to use BiPAP and has 100% compliance.  AHI remains elevated.  I am changing her pressure support to 5, and increasing EPAP minimum pressure to 12.  A new download will be obtained in 1 month for reassessment. 4. Hypothyroidism: Currently on levothyroxine 100 mcg 5. Hyperlipidemia: Statin intolerant.  I had recommended  a trial of Zetia.  Apparently is no longer taking and is on omega-3 fatty acid.  COVID-19 Education: The signs and symptoms of COVID-19 were discussed with the patient and how to seek care for testing (follow up with PCP or arrange E-visit).  The importance of social distancing was discussed today.  Time:   Today, I have spent 26  minutes with the patient with telehealth technology discussing the above problems.     Medication Adjustments/Labs and Tests Ordered: Current medicines are reviewed at length with the patient today.  Concerns regarding medicines are outlined above.   Tests Ordered: No orders of the defined types were placed in this encounter.   Medication Changes: No orders of the defined types were placed in this encounter.   Follow Up:  6 months Signed, Shelva Majestic, MD  09/12/2018 1:13 PM    [place]

## 2018-09-12 NOTE — Patient Instructions (Signed)
Medication Instructions:  Start taking Hydralazine twice daily.  If you need a refill on your cardiac medications before your next appointment, please call your pharmacy.   Follow-Up: At [facility], you and your health needs are our priority.  As part of our continuing mission to provide you with exceptional heart care, we have created designated Provider Care Teams.  These Care Teams include your primary Cardiologist (physician) and Advanced Practice Providers (APPs -  Physician Assistants and Nurse Practitioners) who all work together to provide you with the care you need, when you need it. You will need a follow up appointment in 6 months.  We will place a recall to remind you an appointment is needed, Please call our office 2 months in advance to schedule this appointment.  You may see Dr.Kelly or one of the following Advanced Practice Providers on your designated Care Team: Almyra Deforest, [place] . Fabian Sharp, PA-C  Any Other Special Instructions Will Be Listed Below (If Applicable). Changes made to machine- sent already.

## 2018-10-26 ENCOUNTER — Other Ambulatory Visit: Payer: Self-pay | Admitting: Cardiovascular Disease

## 2018-10-30 ENCOUNTER — Other Ambulatory Visit: Payer: Self-pay | Admitting: Physician Assistant

## 2018-12-04 ENCOUNTER — Other Ambulatory Visit: Payer: Self-pay

## 2018-12-04 ENCOUNTER — Encounter: Payer: Self-pay | Admitting: Internal Medicine

## 2018-12-04 ENCOUNTER — Telehealth (INDEPENDENT_AMBULATORY_CARE_PROVIDER_SITE_OTHER): Payer: Medicare Other | Admitting: Internal Medicine

## 2018-12-04 VITALS — BP 154/74 | HR 53 | Ht 61.0 in | Wt 187.0 lb

## 2018-12-04 DIAGNOSIS — G4733 Obstructive sleep apnea (adult) (pediatric): Secondary | ICD-10-CM | POA: Diagnosis not present

## 2018-12-04 DIAGNOSIS — I10 Essential (primary) hypertension: Secondary | ICD-10-CM

## 2018-12-04 DIAGNOSIS — I480 Paroxysmal atrial fibrillation: Secondary | ICD-10-CM

## 2018-12-04 NOTE — Progress Notes (Signed)
Electrophysiology TeleHealth Note  Due to national recommendations of social distancing due to [place] 19, an audio telehealth visit is felt to be most appropriate for this patient at this time.  Verbal consent was obtained by me for the telehealth visit today.  The patient does not have capability for a Mychart visit.  A phone visit is therefore required today.   Date:  12/04/2018   ID:  Brianna Reyes, DOB Jan 29, 1931, MRN [id number]  Location: patient's home  Provider location:  [place]  Evaluation Performed: Follow-up visit  PCP:  Deland Pretty, MD   Electrophysiologist:  Dr Rayann Heman  Chief Complaint:  palpitations  History of Present Illness:    Brianna Reyes is a 85 y.o. female who presents via telehealth conferencing today.  Since last being seen in our clinic, the patient reports doing very well.   Her afib is well controlled with flecainide.  She mostly only takes the evening dose and skips the AM dose.  She has declined anticoagulation. Today, she denies symptoms of palpitations, chest pain, shortness of breath,  lower extremity edema, dizziness, presyncope, or syncope.  The patient is otherwise without complaint today.  The patient denies symptoms of fevers, chills, cough, or new SOB worrisome for COVID 19.  Past Medical History:  Diagnosis Date  . Arthritis   . Brain tumor ([place])   . Essential hypertension    a. 10/2011 Echo: EF >55%; b. 04/2014 Echo: EF 60-65%, triv AI, mildly dil RA.  Marland Kitchen Hypothyroidism   . Meningioma ([place])   . Palpitations    a. 10/2011 Cardionet; b. 02/2014 Cardionet: sinus rhythm, occas unifocal isolated PVC's, no signif brady/tachy.  . Pedal edema   . Sleep apnea 04/22/08   SLEEP STUDY done @ [facility]. Read by Dr. Keturah Barre. Repeat study done 05/22/11 @ [facility].    Past Surgical History:  Procedure Laterality Date  . APPENDECTOMY    . TONSILLECTOMY    . TUBAL LIGATION      Current Outpatient Medications   Medication Sig Dispense Refill  . Coenzyme Q10 (CO Q10) 200 MG CAPS Take 1 capsule by mouth daily.    Marland Kitchen CRANBERRY PO Take 1 capsule by mouth daily.    . flecainide (TAMBOCOR) 50 MG tablet Take 50 mg by mouth daily.    . hydrALAZINE (APRESOLINE) 25 MG tablet Take 25 mg by mouth daily.    Marland Kitchen levothyroxine (SYNTHROID, LEVOTHROID) 100 MCG tablet Take 1 tablet by mouth every morning.  1  . lisinopril-hydrochlorothiazide (ZESTORETIC) 20-12.5 MG tablet TAKE 1 TABLET BY MOUTH EVERY DAY 90 tablet 0  . LUTEIN-ZEAXANTHIN PO Take 1 tablet by mouth daily.    . Multiple Vitamin (MULTIVITAMIN WITH MINERALS) TABS tablet Take 1 tablet by mouth daily. Contains vitamin C    . Multiple Vitamins-Minerals (ALIVE ENERGY 50+ PO) Take by mouth.    . NON FORMULARY CPAP THERAPY    . omega-3 acid ethyl esters (LOVAZA) 1 G capsule Take 1 g by mouth daily.     . metoprolol tartrate (LOPRESSOR) 50 MG tablet Take 1 tablet by mouth daily.    . nitroGLYCERIN (NITROSTAT) 0.4 MG SL tablet Place 1 tablet (0.4 mg total) under the tongue every 5 (five) minutes as needed for chest pain. 25 tablet 3   No current facility-administered medications for this visit.     Allergies:   Other   Social History:  The patient  reports that she has never smoked.  She has never used smokeless tobacco. She reports that she does not drink alcohol or use drugs.   Family History:  The patient's family history includes Heart disease in her brother; Hypertension in her unknown relative; Stroke in her unknown relative.   ROS:  Please see the history of present illness.   All other systems are personally reviewed and negative.    Exam:    Vital Signs:  BP (!) 154/74   Pulse (!) 53   Ht 5\' 1"  (1.549 m)   Wt 187 lb (84.8 kg)   BMI 35.33 kg/m   Well sounding, alert and conversant   Labs/Other Tests and Data Reviewed:    Recent Labs: No results found for requested labs within last 8760 hours.   Wt Readings from Last 3 Encounters:  12/04/18  187 lb (84.8 kg)  09/12/18 189 lb (85.7 kg)  11/15/17 187 lb 9.6 oz (85.1 kg)     ASSESSMENT & PLAN:    1.  Paroxysmal atrial fibrillation Doing well currently with flecainide (she mostly only takes the evening dose and is doing well with this) chads2vasc score is 4.  She declines [place] therapy  2. HTN Elevated today.  She reports that it is typically controlled at home. No change required today  3. OSA Followed by Dr Claiborne Billings  Follow-up:  Return to see me in a year   Patient Risk:  after full review of this patients clinical status, I feel that they are at moderate risk at this time.  Today, I have spent 15 minutes with the patient with telehealth technology discussing arrhythmia management .    Army Fossa, MD  12/04/2018 12:18 PM     [place] [address] [phone] (office) [phone] (fax)

## 2018-12-20 ENCOUNTER — Telehealth: Payer: Self-pay | Admitting: Internal Medicine

## 2018-12-20 NOTE — Telephone Encounter (Signed)
Call returned to Pt.  Advised per DC to take an extra metoprolol if she is having breakthrough afib.  Long discussion on medications and how to take them correctly.  Pt states Dr. Claiborne Billings had told her she should take her hydralazine twice a day.  Per Pt she is considering doing this.  Advised Pt to call 24 hour on cal if she has any further issues in the middle of the night.  Pt thanked nurse for call.

## 2018-12-20 NOTE — Telephone Encounter (Signed)
New Message   Pt c/o medication issue:  1. Name of Medication: flecainide (TAMBOCOR) 50 MG tablet    2. How are you currently taking this medication (dosage and times per day)?   3. Are you having a reaction (difficulty breathing--STAT)? No    4. What is your medication issue? Patient states that last night she had a rough night. She states that her BP would go up and down last night. She is wondering if she needs to take an extra dose of flecainide. She states that she was also in afib as well and she is very concerned.     Pt c/o BP issue:  1. What are your last 5 BP readings? 190/79 (1am) 55 was her HR. 163/76 63 HR  At noon today 2. Are you having any other symptoms (ex. Dizziness, headache, blurred vision, passed out)? No other issues aside from the afib.  3. What is your medication issue? Patient is wanting to see if she can take an extra dose of the flecainide

## 2019-01-21 ENCOUNTER — Other Ambulatory Visit: Payer: Self-pay | Admitting: Cardiovascular Disease

## 2019-01-31 DIAGNOSIS — G4733 Obstructive sleep apnea (adult) (pediatric): Secondary | ICD-10-CM | POA: Diagnosis not present

## 2019-02-20 ENCOUNTER — Other Ambulatory Visit: Payer: Self-pay | Admitting: Cardiovascular Disease

## 2019-02-20 NOTE — Telephone Encounter (Signed)
Medication was recently sent it on 01/21/2019  lisinopril-hydrochlorothiazide (ZESTORETIC) 20-12.5 MG tablet 90 tablet 0 01/21/2019    Sig: TAKE 1 TABLET BY MOUTH EVERY DAY   Sent to pharmacy as: lisinopril-hydrochlorothiazide (ZESTORETIC) 20-12.5 MG tablet   E-Prescribing Status: Receipt confirmed by pharmacy (01/21/2019  9:45 AM EST)   Pharmacy  [id number] - [place], [place]. AT [address]

## 2019-04-06 ENCOUNTER — Encounter: Payer: Self-pay | Admitting: Cardiovascular Disease

## 2019-04-07 ENCOUNTER — Encounter: Payer: Self-pay | Admitting: Physician Assistant

## 2019-04-07 ENCOUNTER — Telehealth (INDEPENDENT_AMBULATORY_CARE_PROVIDER_SITE_OTHER): Payer: Medicare Other | Admitting: Physician Assistant

## 2019-04-07 VITALS — BP 151/73 | HR 59 | Ht 62.0 in | Wt 189.0 lb

## 2019-04-07 DIAGNOSIS — E039 Hypothyroidism, unspecified: Secondary | ICD-10-CM | POA: Diagnosis not present

## 2019-04-07 DIAGNOSIS — G4733 Obstructive sleep apnea (adult) (pediatric): Secondary | ICD-10-CM | POA: Diagnosis not present

## 2019-04-07 DIAGNOSIS — E785 Hyperlipidemia, unspecified: Secondary | ICD-10-CM | POA: Diagnosis not present

## 2019-04-07 DIAGNOSIS — I480 Paroxysmal atrial fibrillation: Secondary | ICD-10-CM | POA: Diagnosis not present

## 2019-04-07 DIAGNOSIS — I10 Essential (primary) hypertension: Secondary | ICD-10-CM

## 2019-04-07 DIAGNOSIS — Z9989 Dependence on other enabling machines and devices: Secondary | ICD-10-CM

## 2019-04-07 NOTE — Progress Notes (Signed)
Virtual Visit via Telephone Note   This visit type was conducted due to national recommendations for restrictions regarding the COVID-19 Pandemic (e.g. social distancing) in an effort to limit this patient's exposure and mitigate transmission in our community.  Due to her co-morbid illnesses, this patient is at least at moderate risk for complications without adequate follow up.  This format is felt to be most appropriate for this patient at this time.  The patient did not have access to video technology/had technical difficulties with video requiring transitioning to audio format only (telephone).  All issues noted in this document were discussed and addressed.  No physical exam could be performed with this format.  Please refer to the patient's chart for her  consent to telehealth for [facility].   Date:  04/09/2019   ID:  Brianna Reyes, DOB Mar 09, 1931, MRN [id number]  Patient Location: Home Provider Location: Office  PCP:  Deland Pretty, MD  Cardiologist:  Shelva Majestic, MD Electrophysiologist:  None   Evaluation Performed:  Follow-Up Visit  Chief Complaint:  followup  History of Present Illness:    Brianna Reyes is a 86 y.o. female with past medical history of hypertension, PAF, hypothyroidism, hyperlipidemia, diastolic dysfunction and obstructive sleep apnea.  Heart monitor obtained in 2015 demonstrated sinus rhythm with occasional PVCs, otherwise no significant bradycardia or tachycardia.  Echocardiogram obtained in February 2016 showed a normal systolic EF of 60 to [id number], thickened aortic valve but no stenosis, trivial aortic and mitral insufficiency.  Patient was diagnosed with atrial fibrillation in 2018, however she did not wish to start on Eliquis.  She was later seen by Dr. Curt Bears and Dr. Rayann Heman who started her on flecainide 50 mg twice daily.  Patient was last seen by Dr. Claiborne Billings on 09/12/2018 at which time she was doing well.  Given her statin intolerance, she was recommended to  start on a trial of Zetia.  Patient has been staying at home and avoid contact with others in the recent months.  The only person who is in contact with the patient is his son will visit her on a daily basis.  She lives by herself otherwise.  She denies any recent chest pain or shortness of breath.  Blood pressure has been running high.  Instead of taking flecainide 50 mg twice a day, she is only taking it once a day based on last EP note.  She also noticed frequent blood pressure variations as well.  Looking at her medication list, she is only on hydralazine once a day and metoprolol once a day.  According to her, she takes the metoprolol around 9 AM in the morning and to take the lisinopril-hydrochlorothiazide around 3 PM, and lastly take the hydralazine around 8 PM at night.  I recommend a break to metoprolol to half and take 25 mg twice a day.  I also recommend he increase hydralazine to 25 mg twice daily.  She says she has been compliant with her CPAP therapy, however she is curious as how many apnea episodes she continued to have on hourly basis.  I will get in touch with our CPAP coordinator to do a remote download and have this reviewed by Dr. Claiborne Billings.  Otherwise she can follow-up via virtual visit in 6 months.  The patient does not have symptoms concerning for COVID-19 infection (fever, chills, cough, or new shortness of breath).    Past Medical History:  Diagnosis Date  . Arthritis   . Brain tumor ([place])   .  Essential hypertension    a. 10/2011 Echo: EF >55%; b. 04/2014 Echo: EF 60-65%, triv AI, mildly dil RA.  Marland Kitchen Hypothyroidism   . Meningioma ([place])   . Palpitations    a. 10/2011 Cardionet; b. 02/2014 Cardionet: sinus rhythm, occas unifocal isolated PVC's, no signif brady/tachy.  . Pedal edema   . Sleep apnea 04/22/08   SLEEP STUDY done @ [facility]. Read by Dr. Keturah Barre. Repeat study done 05/22/11 @ [facility].   Past Surgical History:  Procedure Laterality Date  .  APPENDECTOMY    . TONSILLECTOMY    . TUBAL LIGATION       Current Meds  Medication Sig  . Coenzyme Q10 (CO Q10) 200 MG CAPS Take 1 capsule by mouth daily.  Marland Kitchen CRANBERRY PO Take 1 capsule by mouth daily.  . flecainide (TAMBOCOR) 50 MG tablet Take 50 mg by mouth daily.  . hydrALAZINE (APRESOLINE) 25 MG tablet Take 25 mg by mouth 2 (two) times daily.  Marland Kitchen levothyroxine (SYNTHROID, LEVOTHROID) 100 MCG tablet Take 1 tablet by mouth every morning.  Marland Kitchen lisinopril-hydrochlorothiazide (ZESTORETIC) 20-12.5 MG tablet TAKE 1 TABLET BY MOUTH EVERY DAY  . LUTEIN-ZEAXANTHIN PO Take 1 tablet by mouth daily.  . metoprolol tartrate (LOPRESSOR) 50 MG tablet Take 25 mg by mouth 2 (two) times daily.   . Multiple Vitamin (MULTIVITAMIN WITH MINERALS) TABS tablet Take 1 tablet by mouth daily. Contains vitamin C  . Multiple Vitamins-Minerals (ALIVE ENERGY 50+ PO) Take by mouth.  . NON FORMULARY CPAP THERAPY     Allergies:   Other   Social History   Tobacco Use  . Smoking status: Never Smoker  . Smokeless tobacco: Never Used  Substance Use Topics  . Alcohol use: No    Alcohol/week: 0.0 standard drinks  . Drug use: No     Family Hx: The patient's family history includes Heart disease in her brother; Hypertension in her unknown relative; Stroke in her unknown relative.  ROS:   Please see the history of present illness.     All other systems reviewed and are negative.   Prior CV studies:   The following studies were reviewed today:  Echo 10/19/2017 LV EF: 60% -   65%  ------------------------------------------------------------------- Indications:      (R07.89).  ------------------------------------------------------------------- History:   PMH:  PVCs. Palpitations. Acquired from the patient and from the patient&'s chart.  Chest pain.  Atrial fibrillation.  Risk factors:  Hypertension. Dyslipidemia.  ------------------------------------------------------------------- Study Conclusions  -  Left ventricle: The cavity size was normal. Wall thickness was   increased in a pattern of mild LVH. Systolic function was normal.   The estimated ejection fraction was in the range of 60% to 65%.   Wall motion was normal; there were no regional wall motion   abnormalities. Features are consistent with a pseudonormal left   ventricular filling pattern, with concomitant abnormal relaxation   and increased filling pressure (grade 2 diastolic dysfunction). - Aortic valve: Mildly calcified annulus. There was mild   regurgitation.  Labs/Other Tests and Data Reviewed:    EKG:  An ECG dated 11/15/2017 was personally reviewed today and demonstrated:  Normal sinus rhythm with poor R wave progression in anterior lead, first-degree AV block.  Recent Labs: No results found for requested labs within last 8760 hours.   Recent Lipid Panel Lab Results  Component Value Date/Time   CHOL 239 (H) 11/13/2017 01:54 PM   TRIG 96 11/13/2017 01:54 PM   HDL 65 11/13/2017  01:54 PM   CHOLHDL 3.7 11/13/2017 01:54 PM   CHOLHDL 4.0 03/26/2014 10:23 AM   LDLCALC 155 (H) 11/13/2017 01:54 PM    Wt Readings from Last 3 Encounters:  04/07/19 189 lb (85.7 kg)  12/04/18 187 lb (84.8 kg)  09/12/18 189 lb (85.7 kg)     Objective:    Vital Signs:  BP (!) 151/73   Pulse (!) 59   Ht 5\' 2"  (1.575 m)   Wt 189 lb (85.7 kg)   BMI 34.57 kg/m    VITAL SIGNS:  reviewed  ASSESSMENT & PLAN:    1. Hypertension: Blood pressure is elevated today, I recommended split metoprolol tartrate to 25 mg twice daily and increase hydralazine to 25 mg twice daily.  2. PAF: On flecainide, followed by electrophysiology service. She declines anticoagulation therapy.   3. Hyperlipidemia: She is no longer taking the [place].  4. Obstructive sleep apnea: On CPAP therapy.  Will obtain a remote download today and forward to Dr. Claiborne Billings  5. Hypothyroidism: Followed by primary care provider  COVID-19 Education: The signs and symptoms of  COVID-19 were discussed with the patient and how to seek care for testing (follow up with PCP or arrange E-visit).  The importance of social distancing was discussed today.  Time:   Today, I have spent 17 minutes with the patient with telehealth technology discussing the above problems.     Medication Adjustments/Labs and Tests Ordered: Current medicines are reviewed at length with the patient today.  Concerns regarding medicines are outlined above.   Tests Ordered: No orders of the defined types were placed in this encounter.   Medication Changes: No orders of the defined types were placed in this encounter.   Follow Up:  Either In Person or Virtual in 6 month(s)  Signed, Almyra Deforest, [place]  04/09/2019 11:25 PM    [place]

## 2019-04-07 NOTE — Patient Instructions (Addendum)
Medication Instructions:   TAKE your Hydralazine 25 mg 2 times a day  TAKE your Metoprolol Tartrate (Lopressor)  25 mg-HALF tablet 2 times a d ay *If you need a refill on your cardiac medications before your next appointment, please call your pharmacy*  Lab Work: NONE ordered at this time of appointment   If you have labs (blood work) drawn today and your tests are completely normal, you will receive your results only by: Marland Kitchen MyChart Message (if you have MyChart) OR . A paper copy in the mail If you have any lab test that is abnormal or we need to change your treatment, we will call you to review the results.  Testing/Procedures: NONE ordered at this time of appointment   Follow-Up: At [facility], you and your health needs are our priority.  As part of our continuing mission to provide you with exceptional heart care, we have created designated Provider Care Teams.  These Care Teams include your primary Cardiologist (physician) and Advanced Practice Providers (APPs -  Physician Assistants and Nurse Practitioners) who all work together to provide you with the care you need, when you need it.  Your next appointment:   6 month(s)  The format for your next appointment:   Either In Person or Virtual  Provider:   Shelva Majestic, MD  Other Instructions

## 2019-04-24 ENCOUNTER — Other Ambulatory Visit: Payer: Self-pay | Admitting: Cardiovascular Disease

## 2019-04-24 NOTE — Telephone Encounter (Signed)
Rx has been sent to the pharmacy electronically. ° °

## 2019-05-26 DIAGNOSIS — G4733 Obstructive sleep apnea (adult) (pediatric): Secondary | ICD-10-CM | POA: Diagnosis not present

## 2019-10-03 ENCOUNTER — Other Ambulatory Visit: Payer: Self-pay | Admitting: Physician Assistant

## 2019-10-23 ENCOUNTER — Emergency Department (HOSPITAL_COMMUNITY): Payer: Medicare Other

## 2019-10-23 ENCOUNTER — Inpatient Hospital Stay (HOSPITAL_COMMUNITY)
Admission: EM | Admit: 2019-10-23 | Discharge: 2019-10-31 | DRG: 440 | Disposition: A | Discharge status: Skilled Nursing Facility | Payer: Medicare Other | Attending: Internal Medicine | Admitting: Internal Medicine

## 2019-10-23 ENCOUNTER — Encounter (HOSPITAL_COMMUNITY): Payer: Self-pay

## 2019-10-23 DIAGNOSIS — Z888 Allergy status to other drugs, medicaments and biological substances status: Secondary | ICD-10-CM | POA: Diagnosis not present

## 2019-10-23 DIAGNOSIS — E876 Hypokalemia: Secondary | ICD-10-CM | POA: Diagnosis not present

## 2019-10-23 DIAGNOSIS — R6889 Other general symptoms and signs: Secondary | ICD-10-CM | POA: Diagnosis not present

## 2019-10-23 DIAGNOSIS — Z86011 Personal history of benign neoplasm of the brain: Secondary | ICD-10-CM | POA: Diagnosis not present

## 2019-10-23 DIAGNOSIS — Z6837 Body mass index (BMI) 37.0-37.9, adult: Secondary | ICD-10-CM | POA: Diagnosis not present

## 2019-10-23 DIAGNOSIS — Z79890 Hormone replacement therapy (postmenopausal): Secondary | ICD-10-CM | POA: Diagnosis not present

## 2019-10-23 DIAGNOSIS — Z8249 Family history of ischemic heart disease and other diseases of the circulatory system: Secondary | ICD-10-CM

## 2019-10-23 DIAGNOSIS — Z743 Need for continuous supervision: Secondary | ICD-10-CM | POA: Diagnosis not present

## 2019-10-23 DIAGNOSIS — I480 Paroxysmal atrial fibrillation: Secondary | ICD-10-CM | POA: Diagnosis present

## 2019-10-23 DIAGNOSIS — K859 Acute pancreatitis without necrosis or infection, unspecified: Secondary | ICD-10-CM | POA: Diagnosis present

## 2019-10-23 DIAGNOSIS — J90 Pleural effusion, not elsewhere classified: Secondary | ICD-10-CM | POA: Diagnosis not present

## 2019-10-23 DIAGNOSIS — E860 Dehydration: Secondary | ICD-10-CM | POA: Diagnosis present

## 2019-10-23 DIAGNOSIS — Z66 Do not resuscitate: Secondary | ICD-10-CM | POA: Diagnosis present

## 2019-10-23 DIAGNOSIS — R531 Weakness: Secondary | ICD-10-CM | POA: Diagnosis present

## 2019-10-23 DIAGNOSIS — M1990 Unspecified osteoarthritis, unspecified site: Secondary | ICD-10-CM | POA: Diagnosis not present

## 2019-10-23 DIAGNOSIS — R109 Unspecified abdominal pain: Secondary | ICD-10-CM | POA: Diagnosis not present

## 2019-10-23 DIAGNOSIS — R110 Nausea: Secondary | ICD-10-CM | POA: Diagnosis not present

## 2019-10-23 DIAGNOSIS — Z20822 Contact with and (suspected) exposure to covid-19: Secondary | ICD-10-CM | POA: Diagnosis present

## 2019-10-23 DIAGNOSIS — K2980 Duodenitis without bleeding: Secondary | ICD-10-CM | POA: Diagnosis not present

## 2019-10-23 DIAGNOSIS — K429 Umbilical hernia without obstruction or gangrene: Secondary | ICD-10-CM | POA: Diagnosis present

## 2019-10-23 DIAGNOSIS — E039 Hypothyroidism, unspecified: Secondary | ICD-10-CM | POA: Diagnosis not present

## 2019-10-23 DIAGNOSIS — R1013 Epigastric pain: Secondary | ICD-10-CM | POA: Diagnosis present

## 2019-10-23 DIAGNOSIS — R112 Nausea with vomiting, unspecified: Secondary | ICD-10-CM | POA: Diagnosis not present

## 2019-10-23 DIAGNOSIS — I517 Cardiomegaly: Secondary | ICD-10-CM | POA: Diagnosis not present

## 2019-10-23 DIAGNOSIS — Z7401 Bed confinement status: Secondary | ICD-10-CM | POA: Diagnosis not present

## 2019-10-23 DIAGNOSIS — F419 Anxiety disorder, unspecified: Secondary | ICD-10-CM | POA: Diagnosis present

## 2019-10-23 DIAGNOSIS — J9811 Atelectasis: Secondary | ICD-10-CM | POA: Diagnosis not present

## 2019-10-23 DIAGNOSIS — G4733 Obstructive sleep apnea (adult) (pediatric): Secondary | ICD-10-CM | POA: Diagnosis not present

## 2019-10-23 DIAGNOSIS — Z79899 Other long term (current) drug therapy: Secondary | ICD-10-CM | POA: Diagnosis not present

## 2019-10-23 DIAGNOSIS — K8689 Other specified diseases of pancreas: Secondary | ICD-10-CM | POA: Diagnosis not present

## 2019-10-23 DIAGNOSIS — M47816 Spondylosis without myelopathy or radiculopathy, lumbar region: Secondary | ICD-10-CM | POA: Diagnosis not present

## 2019-10-23 DIAGNOSIS — R092 Respiratory arrest: Secondary | ICD-10-CM | POA: Diagnosis not present

## 2019-10-23 DIAGNOSIS — K8500 Idiopathic acute pancreatitis without necrosis or infection: Principal | ICD-10-CM | POA: Diagnosis present

## 2019-10-23 DIAGNOSIS — M2550 Pain in unspecified joint: Secondary | ICD-10-CM | POA: Diagnosis not present

## 2019-10-23 DIAGNOSIS — I10 Essential (primary) hypertension: Secondary | ICD-10-CM | POA: Diagnosis not present

## 2019-10-23 DIAGNOSIS — K858 Other acute pancreatitis without necrosis or infection: Secondary | ICD-10-CM | POA: Diagnosis not present

## 2019-10-23 DIAGNOSIS — R079 Chest pain, unspecified: Secondary | ICD-10-CM | POA: Diagnosis not present

## 2019-10-23 DIAGNOSIS — I700 Atherosclerosis of aorta: Secondary | ICD-10-CM | POA: Diagnosis not present

## 2019-10-23 LAB — COMPREHENSIVE METABOLIC PANEL
ALT: 18 U/L (ref 0–44)
AST: 28 U/L (ref 15–41)
Albumin: 4.3 g/dL (ref 3.5–5.0)
Alkaline Phosphatase: 64 U/L (ref 38–126)
Anion gap: 12 (ref 5–15)
BUN: 25 mg/dL — ABNORMAL HIGH (ref 8–23)
CO2: 27 mmol/L (ref 22–32)
Calcium: 10.1 mg/dL (ref 8.9–10.3)
Chloride: 99 mmol/L (ref 98–111)
Creatinine, Ser: 0.89 mg/dL (ref 0.44–1.00)
GFR calc Af Amer: >60 mL/min (ref >60)
GFR calc non Af Amer: 58 mL/min — ABNORMAL LOW (ref >60)
Glucose, Bld: 121 mg/dL — ABNORMAL HIGH (ref 70–99)
Potassium: 3.6 mmol/L (ref 3.5–5.1)
Sodium: 138 mmol/L (ref 135–145)
Total Bilirubin: 0.9 mg/dL (ref 0.3–1.2)
Total Protein: 7.7 g/dL (ref 6.5–8.1)

## 2019-10-23 LAB — LIPID PANEL
Cholesterol: 227 mg/dL — ABNORMAL HIGH (ref 0–200)
HDL: 60 mg/dL (ref >40)
LDL Cholesterol: 157 mg/dL — ABNORMAL HIGH (ref 0–99)
Total CHOL/HDL Ratio: 3.8 RATIO
Triglycerides: 51 mg/dL (ref <150)
VLDL: 10 mg/dL (ref 0–40)

## 2019-10-23 LAB — TROPONIN I (HIGH SENSITIVITY)
Troponin I (High Sensitivity): 6 ng/L (ref <18)
Troponin I (High Sensitivity): 7 ng/L (ref <18)

## 2019-10-23 LAB — CBC
HCT: 43.4 % (ref 36.0–46.0)
Hemoglobin: 13.7 g/dL (ref 12.0–15.0)
MCH: 29.9 pg (ref 26.0–34.0)
MCHC: 31.6 g/dL (ref 30.0–36.0)
MCV: 94.8 fL (ref 80.0–100.0)
Platelets: 243 10*3/uL (ref 150–400)
RBC: 4.58 MIL/uL (ref 3.87–5.11)
RDW: 14.4 % (ref 11.5–15.5)
WBC: 9.5 10*3/uL (ref 4.0–10.5)
nRBC: 0 % (ref 0.0–0.2)

## 2019-10-23 LAB — SARS CORONAVIRUS 2 BY RT PCR (HOSPITAL ORDER, PERFORMED IN ~~LOC~~ HOSPITAL LAB): SARS Coronavirus 2: NEGATIVE

## 2019-10-23 LAB — URINALYSIS, ROUTINE W REFLEX MICROSCOPIC
Bilirubin Urine: NEGATIVE
Glucose, UA: NEGATIVE mg/dL
Hgb urine dipstick: NEGATIVE
Ketones, ur: 5 mg/dL — AB
Leukocytes,Ua: NEGATIVE
Nitrite: NEGATIVE
Protein, ur: NEGATIVE mg/dL
Specific Gravity, Urine: 1.021 (ref 1.005–1.030)
pH: 7 (ref 5.0–8.0)

## 2019-10-23 LAB — LIPASE, BLOOD: Lipase: >10000 U/L — ABNORMAL HIGH (ref 11–51)

## 2019-10-23 MED ORDER — SODIUM CHLORIDE 0.9 % IV SOLN: INTRAVENOUS | Status: AC

## 2019-10-23 MED ORDER — FLECAINIDE ACETATE 50 MG PO TABS
50.0000 mg | ORAL_TABLET | Freq: Two times a day (BID) | ORAL | Status: DC
  Administered 2019-10-24 – 2019-10-31 (×15): 50 mg via ORAL
  Filled 2019-10-23 (×18): qty 1

## 2019-10-23 MED ORDER — LUTEIN-ZEAXANTHIN 15-0.7 MG PO CAPS: ORAL_CAPSULE | Freq: Every day | ORAL | Status: DC

## 2019-10-23 MED ORDER — LEVOTHYROXINE SODIUM 100 MCG PO TABS
100.0000 ug | ORAL_TABLET | Freq: Every morning | ORAL | Status: DC
  Administered 2019-10-25 – 2019-10-31 (×7): 100 ug via ORAL
  Filled 2019-10-23 (×8): qty 1

## 2019-10-23 MED ORDER — HYDROCHLOROTHIAZIDE 12.5 MG PO CAPS
12.5000 mg | ORAL_CAPSULE | Freq: Every day | ORAL | Status: DC
  Administered 2019-10-23: 12.5 mg via ORAL
  Filled 2019-10-23: qty 1

## 2019-10-23 MED ORDER — SODIUM CHLORIDE 0.9 % IV SOLN: Freq: Once | INTRAVENOUS | Status: AC

## 2019-10-23 MED ORDER — METOPROLOL SUCCINATE ER 50 MG PO TB24
50.0000 mg | ORAL_TABLET | Freq: Every day | ORAL | Status: DC
  Administered 2019-10-24 – 2019-10-31 (×7): 50 mg via ORAL
  Filled 2019-10-23 (×8): qty 1

## 2019-10-23 MED ORDER — ONDANSETRON HCL 4 MG/2ML IJ SOLN
4.0000 mg | Freq: Once | INTRAMUSCULAR | Status: AC
  Administered 2019-10-23: 4 mg via INTRAVENOUS
  Filled 2019-10-23: qty 2

## 2019-10-23 MED ORDER — CRANBERRY 250 MG PO TABS: ORAL_TABLET | Freq: Every day | ORAL | Status: DC

## 2019-10-23 MED ORDER — MORPHINE SULFATE (PF) 4 MG/ML IV SOLN
4.0000 mg | Freq: Once | INTRAVENOUS | Status: AC
  Administered 2019-10-23: 4 mg via INTRAVENOUS
  Filled 2019-10-23: qty 1

## 2019-10-23 MED ORDER — ADULT MULTIVITAMIN W/MINERALS CH
1.0000 | ORAL_TABLET | Freq: Every day | ORAL | Status: DC
  Administered 2019-10-24 – 2019-10-31 (×8): 1 via ORAL
  Filled 2019-10-23 (×8): qty 1

## 2019-10-23 MED ORDER — ONDANSETRON HCL 4 MG/2ML IJ SOLN
4.0000 mg | Freq: Four times a day (QID) | INTRAMUSCULAR | Status: DC | PRN
  Administered 2019-10-23 – 2019-10-26 (×6): 4 mg via INTRAVENOUS
  Filled 2019-10-23 (×6): qty 2

## 2019-10-23 MED ORDER — ONDANSETRON 4 MG PO TBDP
4.0000 mg | ORAL_TABLET | Freq: Once | ORAL | Status: AC | PRN
  Administered 2019-10-23: 4 mg via ORAL
  Filled 2019-10-23: qty 1

## 2019-10-23 MED ORDER — IOHEXOL 300 MG/ML  SOLN
100.0000 mL | Freq: Once | INTRAMUSCULAR | Status: AC | PRN
  Administered 2019-10-23: 100 mL via INTRAVENOUS

## 2019-10-23 MED ORDER — HYDRALAZINE HCL 25 MG PO TABS
25.0000 mg | ORAL_TABLET | Freq: Three times a day (TID) | ORAL | Status: DC
  Administered 2019-10-23: 25 mg via ORAL
  Filled 2019-10-23: qty 1

## 2019-10-23 MED ORDER — LORAZEPAM 0.5 MG PO TABS: 0.5000 mg | ORAL_TABLET | Freq: Four times a day (QID) | ORAL | Status: DC | PRN

## 2019-10-23 MED ORDER — HYDROMORPHONE HCL 1 MG/ML IJ SOLN
0.5000 mg | INTRAMUSCULAR | Status: DC | PRN
  Administered 2019-10-23 – 2019-10-26 (×9): 0.5 mg via INTRAVENOUS
  Filled 2019-10-23 (×10): qty 1

## 2019-10-23 MED ORDER — SODIUM CHLORIDE 0.9 % IV BOLUS
500.0000 mL | Freq: Once | INTRAVENOUS | Status: AC
  Administered 2019-10-23: 500 mL via INTRAVENOUS

## 2019-10-23 MED ORDER — LISINOPRIL 20 MG PO TABS
20.0000 mg | ORAL_TABLET | Freq: Every day | ORAL | Status: DC
  Administered 2019-10-23: 20 mg via ORAL
  Filled 2019-10-23: qty 1

## 2019-10-23 MED ORDER — CO Q10 200 MG PO CAPS: 1.0000 | ORAL_CAPSULE | Freq: Every day | ORAL | Status: DC

## 2019-10-23 MED ORDER — MORPHINE SULFATE (PF) 4 MG/ML IV SOLN
4.0000 mg | INTRAVENOUS | Status: DC | PRN
  Administered 2019-10-23: 4 mg via INTRAVENOUS
  Filled 2019-10-23: qty 1

## 2019-10-23 NOTE — ED Notes (Signed)
Patient transported to CT 

## 2019-10-23 NOTE — Progress Notes (Signed)
RT arrived in patients room to place patient on CPAP and she was vomiting. RT holding CPAP at this time.

## 2019-10-23 NOTE — ED Provider Notes (Signed)
[facility] EMERGENCY DEPARTMENT Provider Note   CSN: [id number] Arrival date & time: 10/23/19  1337     History Chief Complaint  Patient presents with  . Abdominal Pain    Brianna Reyes is a 86 y.o. female with PMHx HTN, Hypothyroidism, OSA on CPAP who presents to the ED today via EMS with complaint of gradual onset, constant, achy, severe, diffuse abdominal pain that began this morning upon waking up. Pt also complains of nausea and NBNB emesis (~ 10 episodes today). Last normal bowel movement yesterday; denies diarrhea, constipation, or obstipation. Pt states that the pain has began radiating into her chest and she is having body aches as well. She has been vaccinated against COVID 19. Denies fevers at home however she states she has "chills" inside her body. Pt denies any suspicious food intake. No recent abx use. No recent foreign travel.   The history is provided by the patient, medical records and the EMS personnel.       Past Medical History:  Diagnosis Date  . Arthritis   . Brain tumor ([place])   . Essential hypertension    a. 10/2011 Echo: EF >55%; b. 04/2014 Echo: EF 60-65%, triv AI, mildly dil RA.  Marland Kitchen Hypothyroidism   . Meningioma ([place])   . Palpitations    a. 10/2011 Cardionet; b. 02/2014 Cardionet: sinus rhythm, occas unifocal isolated PVC's, no signif brady/tachy.  . Pedal edema   . Sleep apnea 04/22/08   SLEEP STUDY done @ [facility]. Read by Dr. Keturah Barre. Repeat study done 05/22/11 @ [facility].    Patient Active Problem List   Diagnosis Date Noted  . Pedal edema   . Essential hypertension   . Complex sleep apnea syndrome 06/27/2014  . Aortic valve disease 03/19/2014  . HTN (hypertension) 02/24/2013  . Pyelonephritis 09/19/2012  . UTI (lower urinary tract infection) 09/19/2012  . Obstructive sleep apnea 06/03/2008  . BENIGN NEOPLASM OF BRAIN 05/18/2008  . Hypothyroidism 05/18/2008  . ANXIETY STATE, UNSPECIFIED 05/18/2008    . PALPITATIONS 05/18/2008  . HYPERTENSION NEC 05/18/2008  . PAROXYSMAL NOCTURNAL DYSPNEA 05/15/2008    Past Surgical History:  Procedure Laterality Date  . APPENDECTOMY    . TONSILLECTOMY    . TUBAL LIGATION       OB History   No obstetric history on file.     Family History  Problem Relation Age of Onset  . Heart disease Brother        ischemic  . Hypertension Other   . Stroke Other     Social History   Tobacco Use  . Smoking status: Never Smoker  . Smokeless tobacco: Never Used  Substance Use Topics  . Alcohol use: No    Alcohol/week: 0.0 standard drinks  . Drug use: No    Home Medications Prior to Admission medications   Medication Sig Start Date End Date Taking? Authorizing Provider  Coenzyme Q10 (CO Q10) 200 MG CAPS Take 1 capsule by mouth daily.    [provider]  CRANBERRY PO Take 1 capsule by mouth daily.    [provider]  flecainide (TAMBOCOR) 50 MG tablet TAKE 1 TABLET BY MOUTH TWICE A DAY 10/03/19   Troy Sine, MD  hydrALAZINE (APRESOLINE) 25 MG tablet TAKE 1 TABLET BY MOUTH THREE TIMES A DAY 04/24/19   Troy Sine, MD  levothyroxine (SYNTHROID, LEVOTHROID) 100 MCG tablet Take 1 tablet by mouth every morning. 10/20/17   [provider]  lisinopril-hydrochlorothiazide (ZESTORETIC) 20-12.5 MG tablet TAKE 1 TABLET BY MOUTH EVERY DAY 04/24/19   Troy Sine, MD  LUTEIN-ZEAXANTHIN PO Take 1 tablet by mouth daily.    [provider]  metoprolol tartrate (LOPRESSOR) 50 MG tablet Take 25 mg by mouth 2 (two) times daily.     [provider]  Multiple Vitamin (MULTIVITAMIN WITH MINERALS) TABS tablet Take 1 tablet by mouth daily. Contains vitamin C    [provider]  Multiple Vitamins-Minerals (ALIVE ENERGY 50+ PO) Take by mouth.    [provider]  nitroGLYCERIN (NITROSTAT) 0.4 MG SL tablet Place 1 tablet (0.4 mg total) under the tongue every 5 (five) minutes as needed for chest pain.  07/05/17 04/07/19  Almyra Deforest, PA  NON FORMULARY CPAP THERAPY    [provider]    Allergies    Other  Review of Systems   Review of Systems  Constitutional: Positive for chills. Negative for fever.  Respiratory: Negative for cough and shortness of breath.   Cardiovascular: Positive for chest pain.  Gastrointestinal: Positive for abdominal pain, nausea and vomiting. Negative for constipation and diarrhea.  Genitourinary: Negative for difficulty urinating and dysuria.  All other systems reviewed and are negative.   Physical Exam Updated Vital Signs BP (!) 197/75 (BP Location: Right Wrist)   Pulse (!) 54   Temp 97.8 F (36.6 C) (Oral)   Resp 20   SpO2 98%   Physical Exam Vitals and nursing note reviewed.  Constitutional:      Appearance: She is obese.     Comments: Uncomfortable appearing elderly female  HENT:     Head: Normocephalic and atraumatic.  Eyes:     Conjunctiva/sclera: Conjunctivae normal.  Cardiovascular:     Rate and Rhythm: Normal rate and regular rhythm.     Heart sounds: Normal heart sounds.     Comments: 2+ distal pulses bilaterally Pulmonary:     Effort: Pulmonary effort is normal.     Breath sounds: Normal breath sounds. No wheezing, rhonchi or rales.  Abdominal:     Palpations: Abdomen is soft.     Tenderness: There is generalized abdominal tenderness. There is no guarding or rebound.     Comments: Umbilical hernia appreciated; no overlying skin changes  Musculoskeletal:     Cervical back: Neck supple.  Skin:    General: Skin is warm and dry.     Coloration: Skin is not mottled.  Neurological:     Mental Status: She is alert.     ED Results / Procedures / Treatments   Labs (all labs ordered are listed, but only abnormal results are displayed) Labs Reviewed  COMPREHENSIVE METABOLIC PANEL - Abnormal; Notable for the following components:      Result Value   Glucose, Bld 121 (*)    BUN 25 (*)    GFR calc non Af Amer 58 (*)    All  other components within normal limits  URINALYSIS, ROUTINE W REFLEX MICROSCOPIC - Abnormal; Notable for the following components:   Color, Urine STRAW (*)    Ketones, ur 5 (*)    All other components within normal limits  SARS CORONAVIRUS 2 BY RT PCR (HOSPITAL ORDER, [place] [facility])  CBC  LIPASE, BLOOD  TROPONIN I (HIGH SENSITIVITY)  TROPONIN I (HIGH SENSITIVITY)    EKG None  Radiology DG Chest 2 View  Result Date: 10/23/2019 CLINICAL DATA:  Chest pain EXAM: CHEST - 2 VIEW COMPARISON:  04/05/2014 FINDINGS:  Cardiomegaly, mild vascular congestion. Bibasilar atelectasis. No effusions or overt edema. No acute bony abnormality. IMPRESSION: Cardiomegaly, vascular congestion and bibasilar atelectasis. Electronically Signed   By: Rolm Baptise M.D.   On: 10/23/2019 16:07   CT Abdomen Pelvis W Contrast  Result Date: 10/23/2019 CLINICAL DATA:  86 year old female with abdominal pain. EXAM: CT ABDOMEN AND PELVIS WITH CONTRAST TECHNIQUE: Multidetector CT imaging of the abdomen and pelvis was performed using the standard protocol following bolus administration of intravenous contrast. CONTRAST:  122mL OMNIPAQUE IOHEXOL 300 MG/ML  SOLN COMPARISON:  None. FINDINGS: Lower chest: There are bibasilar linear atelectasis/scarring. There is coronary vascular calcification. No intra-abdominal free air or free fluid. Hepatobiliary: No focal liver abnormality is seen. No gallstones, gallbladder wall thickening, or biliary dilatation. Pancreas: Mild haziness adjacent to the pancreas. Correlation with pancreatic enzymes recommended to evaluate for acute pancreatitis. No drainable fluid collection. Spleen: Normal in size without focal abnormality. Adrenals/Urinary Tract: The adrenal glands unremarkable. There is no hydronephrosis on either side. There is symmetric enhancement and excretion of contrast by both kidneys. There are bilateral renal cysts measure up to 5 cm in the inferior pole of the  right kidney. The visualized ureters and urinary bladder appear unremarkable. Stomach/Bowel: There is mild stranding adjacent to the duodenal C-loop which may be reactive to inflammatory changes of the pancreas or represent enteritis. There are small scattered sigmoid diverticula without active inflammatory changes. There is no bowel obstruction. Appendectomy. Vascular/Lymphatic: There is advanced aortoiliac atherosclerotic disease. The IVC is unremarkable. No portal venous gas. There is no adenopathy. Reproductive: The uterus and ovaries are grossly unremarkable. Other: There is a small fat containing umbilical hernia. There is slight stranding of the herniated fat. No fluid collection. Musculoskeletal: Osteopenia with degenerative changes of the spine. No acute osseous pathology. IMPRESSION: 1. Mild inflammatory changes surrounding the pancreas and duodenal C-loop. Findings favored to represent acute pancreatitis with reactive changes of the duodenum and less likely duodenitis. Clinical correlation is recommended. 2. Small scattered sigmoid diverticula. No bowel obstruction. 3. Aortic Atherosclerosis (ICD10-I70.0). Electronically Signed   By: Anner Crete M.D.   On: 10/23/2019 17:13    Procedures Procedures (including critical care time)  Medications Ordered in ED Medications  morphine 4 MG/ML injection 4 mg (4 mg Intravenous Given 10/23/19 1805)  ondansetron (ZOFRAN-ODT) disintegrating tablet 4 mg (4 mg Oral Given 10/23/19 1344)  morphine 4 MG/ML injection 4 mg (4 mg Intravenous Given 10/23/19 1638)  sodium chloride 0.9 % bolus 500 mL (0 mLs Intravenous Stopped 10/23/19 1732)  ondansetron (ZOFRAN) injection 4 mg (4 mg Intravenous Given 10/23/19 1637)  iohexol (OMNIPAQUE) 300 MG/ML solution 100 mL (100 mLs Intravenous Contrast Given 10/23/19 1651)  0.9 %  sodium chloride infusion ( Intravenous New Bag/Given 10/23/19 1807)    ED Course  I have reviewed the triage vital signs and the nursing  notes.  Pertinent labs & imaging results that were available during my care of the patient were reviewed by me and considered in my medical decision making (see chart for details).  Clinical Course as of Oct 22 1828  Thu Oct 23, 2019  1750 86 yo female presenting to the ED with abrupt onset epigastric pain, nausea and vomiting today.  Patient with significant abdominal pain on arrival, some epigastric TTP on exam, without rigidity or rebound pain.  She was afebrile and hypertensive.  CT scan subsequently showed evidence of acute pancreatitis.  No ductal dilataion or gallstones visualized.  Unclear trigger for this.  She does not  drink alcohol.  She will be started on IVF and admitted to the hospital.  Pain improved after 4mg  IV morphine, she states, "It's easing off."     [MT]  1751 No prior hx of pancreatitis.Daughter at bedside updated.   [MT]    Clinical Course User Index [MT] Wyvonnia Dusky, MD   MDM Rules/Calculators/A&P                          86 year old female presents to the ED today complaining of abdominal pain radiating into her chest with associated nausea and vomiting that began this morning.  On arrival to the ED patient is afebrile, nontachycardic nontachypneic.  She does appear uncomfortable on exam and has some old emesis on her ribs.  Blood pressure is noted to be elevated 197/75, patient told EMS she has not taken her morning medications.  This includes hydralazine and lisinopril/HCTZ.  On exam patient has diffuse abdominal tenderness palpation.  She does have umbilical hernia that she states has been there approximately 1 to 2 years, no overlying skin changes.  Doubt strangulated or incarcerated hernia at this time.  Given she states she is having chest pain as well will obtain EKG, troponin, chest x-ray however I suspect this is all radiating pain from her abdomen. Pt has good distal pulses; extremities warm without signs of mottling. VSS. Doubt dissection.   CBC without  leukocytosis. Hgb stable at 13.7 CMP with glucose 121 and BUN 25. Creatinine WNL at 0.89. No other findings. LFTs within normal limits.  CXR with cardiomegaly and vascular congestion.  Troponin 6.   Called lab regarding Lipase as it has not resulted yet - they are currently on their 12th dilution; suspect it will be very high.  CT scan with findings consistent with pancreatitis; still awaiting U/A. No signs of gallstones seen on CT scan. Will plan to admit. Attending physician Dr. Langston Masker has evaluated patient as well and updated on plan.   U/A without signs of infection Discussed case with [facility] Dr. Roosevelt Locks who agrees to evaluate patient for admission. Still pending lipase at this time.   This note was prepared using Dragon voice recognition software and may include unintentional dictation errors due to the inherent limitations of voice recognition software.  Final Clinical Impression(s) / ED Diagnoses Final diagnoses:  Acute pancreatitis without infection or necrosis, unspecified pancreatitis type    Rx / DC Orders ED Discharge Orders    None       Eustaquio Maize, PA-C 10/23/19 1830    Wyvonnia Dusky, MD 10/24/19 [phone]

## 2019-10-23 NOTE — H&P (Addendum)
History and Physical    Brianna Reyes LZJ:[id number] DOB: [birth year] DOA: 10/23/2019  PCP: Deland Pretty, MD (Confirm with patient/family/NH records and if not entered, this has to be entered at [facility] point of entry) Patient coming from: Home  I have personally briefly reviewed patient's old medical records in [place]  Chief Complaint: Belly hurts  HPI: Brianna Reyes is a 86 y.o. female with medical history significant of HTN, paroxysmal A. fib on flecainide and aspirin, hypothyroidism, umbilical hernia, presented with sudden onset of abdominal pain.  Started this morning, patient woke up with sudden onset of severe abdominal pain 10/10, cramping like, constant, initially around the whole abdomen, then more localized to epigastric area and periumbilical area radiating to the back, associated with feeling of nausea but no vomiting no diarrhea.  No chest pain or short of breath.  No fever or chills, no urinary symptoms.  Patient reported her son died last week and she has been feeling very stressful and attributed new onset abdominal pain to the new mental stress. ED Course: Lipase significant high out of reference range, has to be dilated couple times and still pending.  LFTs, AST 28, ALT 18, bilirubin 0.8.  CT abdomen with contrast shows mild inflammatory changes surrounding the pancreas and duodenum C-loop for foreign acute pancreatitis with reactive changes of the duodenum.  Review of Systems: As per HPI otherwise 14 point review of systems negative.    Past Medical History:  Diagnosis Date  . Arthritis   . Brain tumor ([place])   . Essential hypertension    a. 10/2011 Echo: EF >55%; b. 04/2014 Echo: EF 60-65%, triv AI, mildly dil RA.  Marland Kitchen Hypothyroidism   . Meningioma ([place])   . Palpitations    a. 10/2011 Cardionet; b. 02/2014 Cardionet: sinus rhythm, occas unifocal isolated PVC's, no signif brady/tachy.  . Pedal edema   . Sleep apnea 04/22/08   SLEEP STUDY done @ [facility]. Read by Dr.  Keturah Barre. Repeat study done 05/22/11 @ [facility].    Past Surgical History:  Procedure Laterality Date  . APPENDECTOMY    . TONSILLECTOMY    . TUBAL LIGATION       reports that she has never smoked. She has never used smokeless tobacco. She reports that she does not drink alcohol and does not use drugs.  Allergies  Allergen Reactions  . Other Other (See Comments)    CORTISONE INJECTION; severe dizziness and made her very sick; pt states "it about killed me"    Family History  Problem Relation Age of Onset  . Heart disease Brother        ischemic  . Hypertension Other   . Stroke Other      Prior to Admission medications   Medication Sig Start Date End Date Taking? Authorizing Provider  Coenzyme Q10 (CO Q10) 200 MG CAPS Take 1 capsule by mouth daily.    [provider]  CRANBERRY PO Take 1 capsule by mouth daily.    [provider]  flecainide (TAMBOCOR) 50 MG tablet TAKE 1 TABLET BY MOUTH TWICE A DAY 10/03/19   Troy Sine, MD  hydrALAZINE (APRESOLINE) 25 MG tablet TAKE 1 TABLET BY MOUTH THREE TIMES A DAY 04/24/19   Troy Sine, MD  levothyroxine (SYNTHROID, LEVOTHROID) 100 MCG tablet Take 1 tablet by mouth every morning. 10/20/17   [provider]  lisinopril-hydrochlorothiazide (ZESTORETIC) 20-12.5 MG tablet TAKE 1 TABLET BY MOUTH EVERY DAY 04/24/19  Troy Sine, MD  LUTEIN-ZEAXANTHIN PO Take 1 tablet by mouth daily.    [provider]  metoprolol tartrate (LOPRESSOR) 50 MG tablet Take 25 mg by mouth 2 (two) times daily.     [provider]  Multiple Vitamin (MULTIVITAMIN WITH MINERALS) TABS tablet Take 1 tablet by mouth daily. Contains vitamin C    [provider]  Multiple Vitamins-Minerals (ALIVE ENERGY 50+ PO) Take by mouth.    [provider]  nitroGLYCERIN (NITROSTAT) 0.4 MG SL tablet Place 1 tablet (0.4 mg total) under the tongue every 5 (five) minutes as needed for chest pain.  07/05/17 04/07/19  Almyra Deforest, PA  NON FORMULARY CPAP THERAPY    [provider]    Physical Exam: Vitals:   10/23/19 1338 10/23/19 1537 10/23/19 1815  BP: (!) 197/75 (!) 201/62 (!) 164/66  Pulse: (!) 54 (!) 57 65  Resp: 20 20 20   Temp: 97.8 F (36.6 C)    TempSrc: Oral    SpO2: 98% 100% 94%    Constitutional: NAD, calm, comfortable Vitals:   10/23/19 1338 10/23/19 1537 10/23/19 1815  BP: (!) 197/75 (!) 201/62 (!) 164/66  Pulse: (!) 54 (!) 57 65  Resp: 20 20 20   Temp: 97.8 F (36.6 C)    TempSrc: Oral    SpO2: 98% 100% 94%   Eyes: PERRL, lids and conjunctivae normal ENMT: Mucous membranes are dry. Posterior pharynx clear of any exudate or lesions.Normal dentition.  Neck: normal, supple, no masses, no thyromegaly Respiratory: clear to auscultation bilaterally, no wheezing, no crackles. Normal respiratory effort. No accessory muscle use.  Cardiovascular: Regular rate and rhythm, no murmurs / rubs / gallops. No extremity edema. 2+ pedal pulses. No carotid bruits.  Abdomen: Mild tenderness on deep palpation on epigastric area no rebound no guarding, no masses palpated. No hepatosplenomegaly. Bowel sounds positive.  Musculoskeletal: no clubbing / cyanosis. No joint deformity upper and lower extremities. Good ROM, no contractures. Normal muscle tone.  Skin: no rashes, lesions, ulcers. No induration Neurologic: CN 2-12 grossly intact. Sensation intact, DTR normal. Strength 5/5 in all 4.  Psychiatric: Normal judgment and insight. Alert and oriented x 3. Normal mood.    Labs on Admission: I have personally reviewed following labs and imaging studies  CBC: Recent Labs  Lab 10/23/19 1352  WBC 9.5  HGB 13.7  HCT 43.4  MCV 94.8  PLT 025   Basic Metabolic Panel: Recent Labs  Lab 10/23/19 1352  NA 138  K 3.6  CL 99  CO2 27  GLUCOSE 121*  BUN 25*  CREATININE 0.89  CALCIUM 10.1   GFR: CrCl cannot be calculated (Unknown ideal weight.). Liver Function  Tests: Recent Labs  Lab 10/23/19 1352  AST 28  ALT 18  ALKPHOS 64  BILITOT 0.9  PROT 7.7  ALBUMIN 4.3   No results for input(s): LIPASE, AMYLASE in the last 168 hours. No results for input(s): AMMONIA in the last 168 hours. Coagulation Profile: No results for input(s): INR, PROTIME in the last 168 hours. Cardiac Enzymes: No results for input(s): CKTOTAL, CKMB, CKMBINDEX, TROPONINI in the last 168 hours. BNP (last 3 results) No results for input(s): PROBNP in the last 8760 hours. HbA1C: No results for input(s): HGBA1C in the last 72 hours. CBG: No results for input(s): GLUCAP in the last 168 hours. Lipid Profile: No results for input(s): CHOL, HDL, LDLCALC, TRIG, CHOLHDL, LDLDIRECT in the last 72 hours. Thyroid Function Tests: No results for input(s): TSH, T4TOTAL, FREET4, T3FREE,  THYROIDAB in the last 72 hours. Anemia Panel: No results for input(s): VITAMINB12, FOLATE, FERRITIN, TIBC, IRON, RETICCTPCT in the last 72 hours. Urine analysis:    Component Value Date/Time   COLORURINE STRAW (A) 10/23/2019 1757   APPEARANCEUR CLEAR 10/23/2019 1757   LABSPEC 1.021 10/23/2019 1757   PHURINE 7.0 10/23/2019 1757   GLUCOSEU NEGATIVE 10/23/2019 1757   HGBUR NEGATIVE 10/23/2019 1757   BILIRUBINUR NEGATIVE 10/23/2019 1757   KETONESUR 5 (A) 10/23/2019 1757   PROTEINUR NEGATIVE 10/23/2019 1757   UROBILINOGEN 0.2 04/05/2014 2009   NITRITE NEGATIVE 10/23/2019 1757   LEUKOCYTESUR NEGATIVE 10/23/2019 1757    Radiological Exams on Admission: DG Chest 2 View  Result Date: 10/23/2019 CLINICAL DATA:  Chest pain EXAM: CHEST - 2 VIEW COMPARISON:  04/05/2014 FINDINGS: Cardiomegaly, mild vascular congestion. Bibasilar atelectasis. No effusions or overt edema. No acute bony abnormality. IMPRESSION: Cardiomegaly, vascular congestion and bibasilar atelectasis. Electronically Signed   By: Rolm Baptise M.D.   On: 10/23/2019 16:07   CT Abdomen Pelvis W Contrast  Result Date: 10/23/2019 CLINICAL  DATA:  86 year old female with abdominal pain. EXAM: CT ABDOMEN AND PELVIS WITH CONTRAST TECHNIQUE: Multidetector CT imaging of the abdomen and pelvis was performed using the standard protocol following bolus administration of intravenous contrast. CONTRAST:  133mL OMNIPAQUE IOHEXOL 300 MG/ML  SOLN COMPARISON:  None. FINDINGS: Lower chest: There are bibasilar linear atelectasis/scarring. There is coronary vascular calcification. No intra-abdominal free air or free fluid. Hepatobiliary: No focal liver abnormality is seen. No gallstones, gallbladder wall thickening, or biliary dilatation. Pancreas: Mild haziness adjacent to the pancreas. Correlation with pancreatic enzymes recommended to evaluate for acute pancreatitis. No drainable fluid collection. Spleen: Normal in size without focal abnormality. Adrenals/Urinary Tract: The adrenal glands unremarkable. There is no hydronephrosis on either side. There is symmetric enhancement and excretion of contrast by both kidneys. There are bilateral renal cysts measure up to 5 cm in the inferior pole of the right kidney. The visualized ureters and urinary bladder appear unremarkable. Stomach/Bowel: There is mild stranding adjacent to the duodenal C-loop which may be reactive to inflammatory changes of the pancreas or represent enteritis. There are small scattered sigmoid diverticula without active inflammatory changes. There is no bowel obstruction. Appendectomy. Vascular/Lymphatic: There is advanced aortoiliac atherosclerotic disease. The IVC is unremarkable. No portal venous gas. There is no adenopathy. Reproductive: The uterus and ovaries are grossly unremarkable. Other: There is a small fat containing umbilical hernia. There is slight stranding of the herniated fat. No fluid collection. Musculoskeletal: Osteopenia with degenerative changes of the spine. No acute osseous pathology. IMPRESSION: 1. Mild inflammatory changes surrounding the pancreas and duodenal C-loop.  Findings favored to represent acute pancreatitis with reactive changes of the duodenum and less likely duodenitis. Clinical correlation is recommended. 2. Small scattered sigmoid diverticula. No bowel obstruction. 3. Aortic Atherosclerosis (ICD10-I70.0). Electronically Signed   By: Anner Crete M.D.   On: 10/23/2019 17:13    EKG: Pending  Assessment/Plan Active Problems:   Pancreatitis  (please populate well all problems here in Problem List. (For example, if patient is on BP meds at home and you resume or decide to hold them, it is a problem that needs to be her. Same for CAD, COPD, HLD and so on)  Acute pancreatitis -We will order MRCP without contrast, but given CT finding no CBD dilation and normal LFT, will rule out intrinsic pancrease lesions. -Other etiology of triglyceride and IgG4 level sent -NPO, IVF and pain meds.  HTN, uncontrolled -Likely related to  severe abdominal pain, pain control and resume home BP meds  Severe dehydration -Start maintenance IV fluid  Paroxysmal A. Fib -In sinus, continue flecainide -Not on systemic anticoagulation for personal reasons, f/u with cardio  Hypothyroidism -Continue Synthroid  DVT prophylaxis: SCD Code Status: DNR Family Communication: None at bedside Disposition Plan: Expect more than 2 midnight hospital stay for pain control of pancreatitis Consults called: None Admission status: MedSurg   Lequita Halt MD [facility] Pager [phone]  10/23/2019, 6:50 PM

## 2019-10-23 NOTE — ED Triage Notes (Addendum)
Pt arrives to ED via [facility] from home w/ c/o generalized abdominal pain, n/v that started this morning. Pt HTN w/ EMS, pt states she has not taken BP medications today. Pt has hx of hiatal hernia. EMS VS:  HR 54 CBG 91 SPO2 98% RA BP 185/80

## 2019-10-24 ENCOUNTER — Inpatient Hospital Stay (HOSPITAL_COMMUNITY): Payer: Medicare Other

## 2019-10-24 DIAGNOSIS — E039 Hypothyroidism, unspecified: Secondary | ICD-10-CM

## 2019-10-24 DIAGNOSIS — I10 Essential (primary) hypertension: Secondary | ICD-10-CM

## 2019-10-24 DIAGNOSIS — G4733 Obstructive sleep apnea (adult) (pediatric): Secondary | ICD-10-CM

## 2019-10-24 LAB — COMPREHENSIVE METABOLIC PANEL
ALT: 18 U/L (ref 0–44)
AST: 28 U/L (ref 15–41)
Albumin: 3.5 g/dL (ref 3.5–5.0)
Alkaline Phosphatase: 49 U/L (ref 38–126)
Anion gap: 14 (ref 5–15)
BUN: 19 mg/dL (ref 8–23)
CO2: 23 mmol/L (ref 22–32)
Calcium: 9 mg/dL (ref 8.9–10.3)
Chloride: 101 mmol/L (ref 98–111)
Creatinine, Ser: 0.89 mg/dL (ref 0.44–1.00)
GFR calc Af Amer: >60 mL/min (ref >60)
GFR calc non Af Amer: 58 mL/min — ABNORMAL LOW (ref >60)
Glucose, Bld: 105 mg/dL — ABNORMAL HIGH (ref 70–99)
Potassium: 3.4 mmol/L — ABNORMAL LOW (ref 3.5–5.1)
Sodium: 138 mmol/L (ref 135–145)
Total Bilirubin: 0.9 mg/dL (ref 0.3–1.2)
Total Protein: 6.5 g/dL (ref 6.5–8.1)

## 2019-10-24 LAB — LIPASE, BLOOD: Lipase: 3095 U/L — ABNORMAL HIGH (ref 11–51)

## 2019-10-24 MED ORDER — POTASSIUM CHLORIDE 10 MEQ/100ML IV SOLN
10.0000 meq | INTRAVENOUS | Status: AC
  Administered 2019-10-24 (×2): 10 meq via INTRAVENOUS
  Filled 2019-10-24 (×2): qty 100

## 2019-10-24 MED ORDER — LORAZEPAM 0.5 MG PO TABS
0.5000 mg | ORAL_TABLET | ORAL | Status: DC | PRN
  Administered 2019-10-25 – 2019-10-31 (×8): 0.5 mg via ORAL
  Filled 2019-10-24 (×8): qty 1

## 2019-10-24 MED ORDER — POTASSIUM CHLORIDE 10 MEQ/100ML IV SOLN
INTRAVENOUS | Status: AC
  Administered 2019-10-24: 10 meq
  Filled 2019-10-24: qty 100

## 2019-10-24 MED ORDER — SODIUM CHLORIDE 0.9 % IV SOLN: INTRAVENOUS | Status: DC

## 2019-10-24 MED ORDER — HYDRALAZINE HCL 20 MG/ML IJ SOLN
10.0000 mg | Freq: Four times a day (QID) | INTRAMUSCULAR | Status: DC | PRN
  Administered 2019-10-30: 10 mg via INTRAVENOUS
  Filled 2019-10-24: qty 0.5

## 2019-10-24 MED ORDER — METOPROLOL TARTRATE 5 MG/5ML IV SOLN
INTRAVENOUS | Status: AC
  Administered 2019-10-24: 5 mg
  Filled 2019-10-24: qty 5

## 2019-10-24 MED ORDER — POTASSIUM CHLORIDE 10 MEQ/100ML IV SOLN
INTRAVENOUS | Status: AC
  Administered 2019-10-24: 10 meq via INTRAVENOUS
  Filled 2019-10-24: qty 100

## 2019-10-24 MED ORDER — METOPROLOL TARTRATE 5 MG/5ML IV SOLN
5.0000 mg | Freq: Four times a day (QID) | INTRAVENOUS | Status: DC | PRN
  Administered 2019-10-25: 5 mg via INTRAVENOUS
  Filled 2019-10-24: qty 5

## 2019-10-24 NOTE — Progress Notes (Signed)
Patient called and stated that she felt that there was something wrong with her heart. Telemetry reviewed and assessed that patient was in a new rhythm of Afib with a HR ranging between 90 and 120's. MD notified and placed orders for metoprolol 5 mg IV once. Orders followed. Patient stated, "I feel like my heart rate is getting better." Will continue to monitor.

## 2019-10-24 NOTE — Progress Notes (Addendum)
PROGRESS NOTE  Brianna Reyes:[id number] DOB: [birth year] DOA: 10/23/2019 PCP: Brianna Pretty, MD   LOS: 1 day   Brief narrative: As per HPI,   Brianna Reyes is a 86 y.o. female with medical history significant of HTN, paroxysmal A. fib on flecainide and aspirin, hypothyroidism, umbilical hernia, presented to the hospital with sudden onset of abdominal pain.  Patient reported her son died last week and she has been feeling very stressful and attributed new onset abdominal pain to the new mental stress. In the ED, Lipase significant high out of reference range.  LFTs, AST 28, ALT 18, bilirubin 0.8.  CT abdomen with contrast showed mild inflammatory changes surrounding the pancreas and duodenum C-loop suggestive of acute pancreatitis with reactive changes of the duodenum. Patient was then admitted to the hospital for further evaluation and treatment.  Assessment/Plan:  Principal Problem:   Acute pancreatitis Active Problems:   Hypothyroidism   Obstructive sleep apnea   Essential hypertension  Acute pancreatitis CT abdomen with mild inflammatory changes. MRCP pending. Initial lipase of more than 10,000 which has trended down to 3000 at this time. Lipid panel showed normal triglyceride.Continue n.p.o. IV fluids analgesia.  MRCP pending for further evaluation.  Mild hypokalemia.  Will replace IV.  Check BMP in a.m.  Essential hypertension. Uncontrolled on presentation. On hydralazine, hydrochlorothiazide and lisinopril at home including metoprolol. Hold off with hydrochlorothiazide and lisinopril at this time. Changed to IV hydralazine.  Will closely monitor blood pressure.  Volume depletion. Continue IV fluids.  Reassess fluid need in a.m.  Paroxysmal A. Fib In sinus rhythm at this time. Continue flecainide. Not on systemic anticoagulation for personal reasons, patient follows up with cardiology as outpatient.  Hypothyroidism -Continue Synthroid  Obstructive sleep apnea. Continue  CPAP at nighttime.  DVT prophylaxis: SCDs Start: 10/23/19 1849  Code Status: DNR  Family Communication: I spoke with the patient's son Mr. Brianna Reyes on the phone and updated him about the clinical condition of the patient.  Status is: Inpatient  Remains inpatient appropriate because:Inpatient level of care appropriate due to severity of illness, IV fluids, acute pancreatitis   Dispo: The patient is from: Home              Anticipated d/c is to: Home              Anticipated d/c date is: 2 days              Patient currently is not medically stable to d/c.  Consultants:  None  Procedures:  MRCP  Antibiotics:  . None  Anti-infectives (From admission, onward)   None     Subjective: Today, patient was seen and examined at bedside.  Patient states that she felt nauseated and was not able to do MRI this morning.  Denies overt pain at the time of my examination.  No shortness of breath, cough, fever  Objective: Vitals:   10/24/19 0328 10/24/19 0720  BP: (!) 165/62 (!) 146/55  Pulse: 67 (!) 58  Resp: 16 16  Temp: 98.4 F (36.9 C) 98.5 F (36.9 C)  SpO2: 91% 93%    Intake/Output Summary (Last 24 hours) at 10/24/2019 0742 Last data filed at 10/24/2019 0600 Gross per 24 hour  Intake 1279.02 ml  Output 500 ml  Net 779.02 ml   There were no vitals filed for this visit. There is no height or weight on file to calculate BMI.   Physical Exam: GENERAL: Patient is alert awake and oriented. Not  in obvious distress. HENT: No scleral pallor or icterus. Pupils equally reactive to light. Oral mucosa is moist NECK: is supple, no gross swelling noted. CHEST: Clear to auscultation. No crackles or wheezes.  Diminished breath sounds bilaterally. CVS: S1 and S2 heard, no murmur. Regular rate and rhythm.  ABDOMEN: Epigastric tenderness on palpation, bowel sounds present EXTREMITIES: No edema. CNS: Cranial nerves are intact. No focal motor deficits. SKIN: warm and dry without  rashes.  Data Review: I have personally reviewed the following laboratory data and studies,  CBC: Recent Labs  Lab 10/23/19 1352  WBC 9.5  HGB 13.7  HCT 43.4  MCV 94.8  PLT 782   Basic Metabolic Panel: Recent Labs  Lab 10/23/19 1352 10/24/19 0439  NA 138 138  K 3.6 3.4*  CL 99 101  CO2 27 23  GLUCOSE 121* 105*  BUN 25* 19  CREATININE 0.89 0.89  CALCIUM 10.1 9.0   Liver Function Tests: Recent Labs  Lab 10/23/19 1352 10/24/19 0439  AST 28 28  ALT 18 18  ALKPHOS 64 49  BILITOT 0.9 0.9  PROT 7.7 6.5  ALBUMIN 4.3 3.5   Recent Labs  Lab 10/23/19 1352 10/24/19 0439  LIPASE >10,000* 3,095*   No results for input(s): AMMONIA in the last 168 hours. Cardiac Enzymes: No results for input(s): CKTOTAL, CKMB, CKMBINDEX, TROPONINI in the last 168 hours. BNP (last 3 results) No results for input(s): BNP in the last 8760 hours.  ProBNP (last 3 results) No results for input(s): PROBNP in the last 8760 hours.  CBG: No results for input(s): GLUCAP in the last 168 hours. Recent Results (from the past 240 hour(s))  SARS Coronavirus 2 by RT PCR (hospital order, performed in [facility] hospital lab) Nasopharyngeal Nasopharyngeal Swab     Status: None   Collection Time: 10/23/19  5:42 PM   Specimen: Nasopharyngeal Swab  Result Value Ref Range Status   SARS Coronavirus 2 NEGATIVE NEGATIVE Final    Comment: (NOTE) SARS-CoV-2 target nucleic acids are NOT DETECTED.  The SARS-CoV-2 RNA is generally detectable in upper and lower respiratory specimens during the acute phase of infection. The lowest concentration of SARS-CoV-2 viral copies this assay can detect is 250 copies / mL. A negative result does not preclude SARS-CoV-2 infection and should not be used as the sole basis for treatment or other patient management decisions.  A negative result may occur with improper specimen collection / handling, submission of specimen other than nasopharyngeal swab, presence of viral  mutation(s) within the areas targeted by this assay, and inadequate number of viral copies (<250 copies / mL). A negative result must be combined with clinical observations, patient history, and epidemiological information.  Fact Sheet for Patients:   StrictlyIdeas.no  Fact Sheet for Healthcare Providers: BankingDealers.co.za  This test is not yet approved or  cleared by the [place] FDA and has been authorized for detection and/or diagnosis of SARS-CoV-2 by FDA under an Emergency Use Authorization (EUA).  This EUA will remain in effect (meaning this test can be used) for the duration of the COVID-19 declaration under Section 564(b)(1) of the Act, 21 U.S.C. section 360bbb-3(b)(1), unless the authorization is terminated or revoked sooner.  Performed at [facility], [place] [address] [place], [place] [address]      Studies: DG Chest 2 View  Result Date: 10/23/2019 CLINICAL DATA:  Chest pain EXAM: CHEST - 2 VIEW COMPARISON:  04/05/2014 FINDINGS: Cardiomegaly, mild vascular congestion. Bibasilar atelectasis. No effusions or overt edema.  No acute bony abnormality. IMPRESSION: Cardiomegaly, vascular congestion and bibasilar atelectasis. Electronically Signed   By: Rolm Baptise M.D.   On: 10/23/2019 16:07   CT Abdomen Pelvis W Contrast  Result Date: 10/23/2019 CLINICAL DATA:  86 year old female with abdominal pain. EXAM: CT ABDOMEN AND PELVIS WITH CONTRAST TECHNIQUE: Multidetector CT imaging of the abdomen and pelvis was performed using the standard protocol following bolus administration of intravenous contrast. CONTRAST:  158mL OMNIPAQUE IOHEXOL 300 MG/ML  SOLN COMPARISON:  None. FINDINGS: Lower chest: There are bibasilar linear atelectasis/scarring. There is coronary vascular calcification. No intra-abdominal free air or free fluid. Hepatobiliary: No focal liver abnormality is seen. No gallstones, gallbladder wall thickening, or  biliary dilatation. Pancreas: Mild haziness adjacent to the pancreas. Correlation with pancreatic enzymes recommended to evaluate for acute pancreatitis. No drainable fluid collection. Spleen: Normal in size without focal abnormality. Adrenals/Urinary Tract: The adrenal glands unremarkable. There is no hydronephrosis on either side. There is symmetric enhancement and excretion of contrast by both kidneys. There are bilateral renal cysts measure up to 5 cm in the inferior pole of the right kidney. The visualized ureters and urinary bladder appear unremarkable. Stomach/Bowel: There is mild stranding adjacent to the duodenal C-loop which may be reactive to inflammatory changes of the pancreas or represent enteritis. There are small scattered sigmoid diverticula without active inflammatory changes. There is no bowel obstruction. Appendectomy. Vascular/Lymphatic: There is advanced aortoiliac atherosclerotic disease. The IVC is unremarkable. No portal venous gas. There is no adenopathy. Reproductive: The uterus and ovaries are grossly unremarkable. Other: There is a small fat containing umbilical hernia. There is slight stranding of the herniated fat. No fluid collection. Musculoskeletal: Osteopenia with degenerative changes of the spine. No acute osseous pathology. IMPRESSION: 1. Mild inflammatory changes surrounding the pancreas and duodenal C-loop. Findings favored to represent acute pancreatitis with reactive changes of the duodenum and less likely duodenitis. Clinical correlation is recommended. 2. Small scattered sigmoid diverticula. No bowel obstruction. 3. Aortic Atherosclerosis (ICD10-I70.0). Electronically Signed   By: Anner Crete M.D.   On: 10/23/2019 17:13      Flora Lipps, MD  [facility] 10/24/2019

## 2019-10-24 NOTE — Progress Notes (Signed)
Patient has home unit at bedside.  Patient self manages machine, water is in machine and ready to go.

## 2019-10-24 NOTE — Progress Notes (Signed)
New Admission Note:   Arrival Method: via stretcher from ED  Mental Orientation: Alert & oriented x4 Telemetry: 5M22, CCMD notified Assessment: to be completed Skin: Intact, warm and dry IV: RAC, infusing Pain: 4/10, pt. refused pain med for now. repositioned patient Tubes: None Safety Measures: Safety Fall Prevention Plan has been discussed  Admission: to be completed 5 [place] Orientation: Patient has been orientated to the room, unit and staff.   Family: none at bedside  Orders to be reviewed and implemented. Will continue to monitor the patient. Call light has been placed within reach and bed alarm has been activated.

## 2019-10-25 LAB — COMPREHENSIVE METABOLIC PANEL
ALT: 22 U/L (ref 0–44)
AST: 44 U/L — ABNORMAL HIGH (ref 15–41)
Albumin: 3.2 g/dL — ABNORMAL LOW (ref 3.5–5.0)
Alkaline Phosphatase: 46 U/L (ref 38–126)
Anion gap: 10 (ref 5–15)
BUN: 14 mg/dL (ref 8–23)
CO2: 24 mmol/L (ref 22–32)
Calcium: 8.8 mg/dL — ABNORMAL LOW (ref 8.9–10.3)
Chloride: 102 mmol/L (ref 98–111)
Creatinine, Ser: 0.89 mg/dL (ref 0.44–1.00)
GFR calc Af Amer: >60 mL/min (ref >60)
GFR calc non Af Amer: 58 mL/min — ABNORMAL LOW (ref >60)
Glucose, Bld: 76 mg/dL (ref 70–99)
Potassium: 3.3 mmol/L — ABNORMAL LOW (ref 3.5–5.1)
Sodium: 136 mmol/L (ref 135–145)
Total Bilirubin: 1.2 mg/dL (ref 0.3–1.2)
Total Protein: 6.3 g/dL — ABNORMAL LOW (ref 6.5–8.1)

## 2019-10-25 LAB — CBC
HCT: 39.4 % (ref 36.0–46.0)
Hemoglobin: 12.3 g/dL (ref 12.0–15.0)
MCH: 29.6 pg (ref 26.0–34.0)
MCHC: 31.2 g/dL (ref 30.0–36.0)
MCV: 94.9 fL (ref 80.0–100.0)
Platelets: 207 10*3/uL (ref 150–400)
RBC: 4.15 MIL/uL (ref 3.87–5.11)
RDW: 14.5 % (ref 11.5–15.5)
WBC: 10.2 10*3/uL (ref 4.0–10.5)
nRBC: 0 % (ref 0.0–0.2)

## 2019-10-25 LAB — LIPASE, BLOOD: Lipase: 158 U/L — ABNORMAL HIGH (ref 11–51)

## 2019-10-25 LAB — PHOSPHORUS: Phosphorus: 1.9 mg/dL — ABNORMAL LOW (ref 2.5–4.6)

## 2019-10-25 LAB — MAGNESIUM: Magnesium: 1.8 mg/dL (ref 1.7–2.4)

## 2019-10-25 LAB — IGG 4: IgG, Subclass 4: 37 mg/dL (ref 2–96)

## 2019-10-25 MED ORDER — POTASSIUM & SODIUM PHOSPHATES 280-160-250 MG PO PACK
1.0000 | PACK | Freq: Three times a day (TID) | ORAL | Status: DC
  Administered 2019-10-25 – 2019-10-31 (×22): 1 via ORAL
  Filled 2019-10-25 (×26): qty 1

## 2019-10-25 MED ORDER — POTASSIUM PHOSPHATES 15 MMOLE/5ML IV SOLN
30.0000 mmol | Freq: Once | INTRAVENOUS | Status: AC
  Administered 2019-10-25: 30 mmol via INTRAVENOUS
  Filled 2019-10-25: qty 10

## 2019-10-25 NOTE — Progress Notes (Signed)
PROGRESS NOTE  SHELIE LANSING PJK:[id number] DOB: 14-Jan-1931 DOA: 10/23/2019 PCP: Deland Pretty, MD   LOS: 2 days   Brief narrative: As per HPI,   Brianna Reyes is a 86 y.o. female with medical history significant of HTN, paroxysmal A. fib on flecainide and aspirin, hypothyroidism, umbilical hernia, presented to the hospital with sudden onset of abdominal pain.  Patient reported her son died last week and she has been feeling very stressful and attributed new onset abdominal pain to the new mental stress. In the ED, Lipase significant high out of reference range.  LFTs, AST 28, ALT 18, bilirubin 0.8.  CT abdomen with contrast showed mild inflammatory changes surrounding the pancreas and duodenum C-loop suggestive of acute pancreatitis with reactive changes of the duodenum. Patient was then admitted to the hospital for further evaluation and treatment.  Assessment/Plan:  Principal Problem:   Acute pancreatitis Active Problems:   Hypothyroidism   Obstructive sleep apnea   Essential hypertension  Acute pancreatitis, likely idiopathic pancreatitis CT abdomen with mild inflammatory changes. MRCP pending. Initial lipase of more than 10,000 which has trended down to 3000>158 at this time. Lipid panel showed normal triglyceride.we will start the patient on clears today. Continue IV fluids analgesia.  MRCP without any acute findings peripancreatic edema most notable along the pancreatic tail. Will advance diet as tolerated. Continue to hold hydrochlorothiazide and lisinopril at this time.  Mild hypokalemia. Will replace. Check BMP in a.m.  Essential hypertension.  On hydralazine, hydrochlorothiazide and lisinopril at home including metoprolol. Hold off with hydrochlorothiazide and lisinopril at this time. On  IV hydralazine.  Will closely monitor blood pressure.  Volume depletion. Continue IV fluids. Diet has been advanced to clears today. Decrease IV fluids.  Paroxysmal A. Fib Mild RVR  yesterday. Continue flecainide and metoprolol. IV metoprolol as needed has been added. Not on systemic anticoagulation for personal reasons, patient follows up with cardiology as outpatient. Blood pressure is stable at this time.  Hypothyroidism Continue Synthroid  Hypophosphatemia. Will give 1 dose of potassium phosphate today. Check levels in a.m.  Obstructive sleep apnea. Continue CPAP at nighttime.  DVT prophylaxis: SCDs Start: 10/23/19 1849  Code Status: DNR  Family Communication:  None today. Spoke with the patient's son yesterday.   Status is: Inpatient  Remains inpatient appropriate because:Inpatient level of care appropriate due to severity of illness, IV fluids, acute pancreatitis, electrolyte imbalance   Dispo: The patient is from: Home              Anticipated d/c is to: Home              Anticipated d/c date is: 2 days              Patient currently is not medically stable to d/c.  Consultants:  None  Procedures:  MRCP  Antibiotics:  . None  Anti-infectives (From admission, onward)   None     Subjective:  Today, patient was seen and examined at bedside.  No nausea, vomiting. Denies overt abdominal pain. Wishes to advance diet.  Objective: Vitals:   10/25/19 0516 10/25/19 0858  BP:  115/71  Pulse: (!) 105 83  Resp:  18  Temp:  98.5 F (36.9 C)  SpO2:  94%    Intake/Output Summary (Last 24 hours) at 10/25/2019 1155 Last data filed at 10/25/2019 1145 Gross per 24 hour  Intake 2708.85 ml  Output 0 ml  Net 2708.85 ml   There is no height or weight on file  to calculate BMI.   Physical Exam: GENERAL: Patient is alert awake and oriented. Not in obvious distress. HENT: No scleral pallor or icterus. Pupils equally reactive to light. Oral mucosa is moist NECK: is supple, no gross swelling noted. CHEST: Clear to auscultation. No crackles or wheezes.  Diminished breath sounds bilaterally. CVS: S1 and S2 heard, no murmur. Regular rate and rhythm.   ABDOMEN: Mild epigastric tenderness on palpation, bowel sounds present EXTREMITIES: No edema. CNS: Cranial nerves are intact. No focal motor deficits. SKIN: warm and dry without rashes.  Data Review: I have personally reviewed the following laboratory data and studies,  CBC: Recent Labs  Lab 10/23/19 1352 10/25/19 0455  WBC 9.5 10.2  HGB 13.7 12.3  HCT 43.4 39.4  MCV 94.8 94.9  PLT 243 366   Basic Metabolic Panel: Recent Labs  Lab 10/23/19 1352 10/24/19 0439 10/25/19 0455  NA 138 138 136  K 3.6 3.4* 3.3*  CL 99 101 102  CO2 27 23 24   GLUCOSE 121* 105* 76  BUN 25* 19 14  CREATININE 0.89 0.89 0.89  CALCIUM 10.1 9.0 8.8*  MG  --   --  1.8  PHOS  --   --  1.9*   Liver Function Tests: Recent Labs  Lab 10/23/19 1352 10/24/19 0439 10/25/19 0455  AST 28 28 44*  ALT 18 18 22   ALKPHOS 64 49 46  BILITOT 0.9 0.9 1.2  PROT 7.7 6.5 6.3*  ALBUMIN 4.3 3.5 3.2*   Recent Labs  Lab 10/23/19 1352 10/24/19 0439 10/25/19 0455  LIPASE >10,000* 3,095* 158*   No results for input(s): AMMONIA in the last 168 hours. Cardiac Enzymes: No results for input(s): CKTOTAL, CKMB, CKMBINDEX, TROPONINI in the last 168 hours. BNP (last 3 results) No results for input(s): BNP in the last 8760 hours.  ProBNP (last 3 results) No results for input(s): PROBNP in the last 8760 hours.  CBG: No results for input(s): GLUCAP in the last 168 hours. Recent Results (from the past 240 hour(s))  SARS Coronavirus 2 by RT PCR (hospital order, performed in [facility] hospital lab) Nasopharyngeal Nasopharyngeal Swab     Status: None   Collection Time: 10/23/19  5:42 PM   Specimen: Nasopharyngeal Swab  Result Value Ref Range Status   SARS Coronavirus 2 NEGATIVE NEGATIVE Final    Comment: (NOTE) SARS-CoV-2 target nucleic acids are NOT DETECTED.  The SARS-CoV-2 RNA is generally detectable in upper and lower respiratory specimens during the acute phase of infection. The lowest concentration of  SARS-CoV-2 viral copies this assay can detect is 250 copies / mL. A negative result does not preclude SARS-CoV-2 infection and should not be used as the sole basis for treatment or other patient management decisions.  A negative result may occur with improper specimen collection / handling, submission of specimen other than nasopharyngeal swab, presence of viral mutation(s) within the areas targeted by this assay, and inadequate number of viral copies (<250 copies / mL). A negative result must be combined with clinical observations, patient history, and epidemiological information.  Fact Sheet for Patients:   StrictlyIdeas.no  Fact Sheet for Healthcare Providers: BankingDealers.co.za  This test is not yet approved or  cleared by the [place] FDA and has been authorized for detection and/or diagnosis of SARS-CoV-2 by FDA under an Emergency Use Authorization (EUA).  This EUA will remain in effect (meaning this test can be used) for the duration of the COVID-19 declaration under Section 564(b)(1) of the Act, 21 U.S.C. section  360bbb-3(b)(1), unless the authorization is terminated or revoked sooner.  Performed at [facility], [place] [address] [place], [place] [address]      Studies: DG Chest 2 View  Result Date: 10/23/2019 CLINICAL DATA:  Chest pain EXAM: CHEST - 2 VIEW COMPARISON:  04/05/2014 FINDINGS: Cardiomegaly, mild vascular congestion. Bibasilar atelectasis. No effusions or overt edema. No acute bony abnormality. IMPRESSION: Cardiomegaly, vascular congestion and bibasilar atelectasis. Electronically Signed   By: Rolm Baptise M.D.   On: 10/23/2019 16:07   CT Abdomen Pelvis W Contrast  Result Date: 10/23/2019 CLINICAL DATA:  86 year old female with abdominal pain. EXAM: CT ABDOMEN AND PELVIS WITH CONTRAST TECHNIQUE: Multidetector CT imaging of the abdomen and pelvis was performed using the standard protocol following bolus  administration of intravenous contrast. CONTRAST:  175mL OMNIPAQUE IOHEXOL 300 MG/ML  SOLN COMPARISON:  None. FINDINGS: Lower chest: There are bibasilar linear atelectasis/scarring. There is coronary vascular calcification. No intra-abdominal free air or free fluid. Hepatobiliary: No focal liver abnormality is seen. No gallstones, gallbladder wall thickening, or biliary dilatation. Pancreas: Mild haziness adjacent to the pancreas. Correlation with pancreatic enzymes recommended to evaluate for acute pancreatitis. No drainable fluid collection. Spleen: Normal in size without focal abnormality. Adrenals/Urinary Tract: The adrenal glands unremarkable. There is no hydronephrosis on either side. There is symmetric enhancement and excretion of contrast by both kidneys. There are bilateral renal cysts measure up to 5 cm in the inferior pole of the right kidney. The visualized ureters and urinary bladder appear unremarkable. Stomach/Bowel: There is mild stranding adjacent to the duodenal C-loop which may be reactive to inflammatory changes of the pancreas or represent enteritis. There are small scattered sigmoid diverticula without active inflammatory changes. There is no bowel obstruction. Appendectomy. Vascular/Lymphatic: There is advanced aortoiliac atherosclerotic disease. The IVC is unremarkable. No portal venous gas. There is no adenopathy. Reproductive: The uterus and ovaries are grossly unremarkable. Other: There is a small fat containing umbilical hernia. There is slight stranding of the herniated fat. No fluid collection. Musculoskeletal: Osteopenia with degenerative changes of the spine. No acute osseous pathology. IMPRESSION: 1. Mild inflammatory changes surrounding the pancreas and duodenal C-loop. Findings favored to represent acute pancreatitis with reactive changes of the duodenum and less likely duodenitis. Clinical correlation is recommended. 2. Small scattered sigmoid diverticula. No bowel obstruction. 3.  Aortic Atherosclerosis (ICD10-I70.0). Electronically Signed   By: Anner Crete M.D.   On: 10/23/2019 17:13   MR ABDOMEN MRCP WO CONTRAST  Result Date: 10/24/2019 CLINICAL DATA:  Elevated lipase levels, clinical suspicion for pancreatitis. EXAM: MRI ABDOMEN WITHOUT CONTRAST  (INCLUDING MRCP) TECHNIQUE: Multiplanar multisequence MR imaging of the abdomen was performed. Heavily T2-weighted images of the biliary and pancreatic ducts were obtained, and three-dimensional MRCP images were rendered by post processing. COMPARISON:  CT abdomen 10/23/2019 FINDINGS: Despite efforts by the technologist and patient, motion artifact is present on today's exam and could not be eliminated. This reduces exam sensitivity and specificity. Lower chest: Increasing dependent atelectasis in both lower lobes. Mild cardiomegaly. Trace right pleural effusion, new compared to yesterday. Hepatobiliary: Unremarkable.  No biliary dilatation. Pancreas: Peripancreatic edema, most notable along the pancreatic tail, probably a manifestation of pancreatitis. No dorsal pancreatic duct dilatation. Normal pancreatic duct branching pattern. Spleen:  Unremarkable Adrenals/Urinary Tract: Bosniak category 2 cyst of the right kidney lower pole. Additional benign bilateral renal cysts. Adrenal glands unremarkable. Stomach/Bowel: Unremarkable Vascular/Lymphatic: Aortoiliac atherosclerotic vascular disease. No pathologic adenopathy identified. Other:  Trace perihepatic and perisplenic ascites. Musculoskeletal: Lumbar spondylosis and  degenerative disc disease. Mild dextroconvex lower thoracic scoliosis and mild levoconvex lumbar scoliosis with rotary component. IMPRESSION: 1. Peripancreatic edema, most notable along the pancreatic tail, probably a manifestation of pancreatitis. No dorsal pancreatic duct dilatation. No appreciable abscess or pseudocyst. 2. No biliary dilatation. 3. Trace perihepatic and perisplenic ascites. 4. Increasing dependent  atelectasis in both lower lobes. Trace right pleural effusion. 5. Lumbar spondylosis and degenerative disc disease. 6.  Aortic Atherosclerosis (ICD10-I70.0). Electronically Signed   By: Van Clines M.D.   On: 10/24/2019 15:37      Flora Lipps, MD  [facility] 10/25/2019

## 2019-10-25 NOTE — Progress Notes (Signed)
Patient placed herself on home cpap. RT will monitor as needed.

## 2019-10-26 LAB — CBC
HCT: 36.8 % (ref 36.0–46.0)
Hemoglobin: 11.8 g/dL — ABNORMAL LOW (ref 12.0–15.0)
MCH: 30 pg (ref 26.0–34.0)
MCHC: 32.1 g/dL (ref 30.0–36.0)
MCV: 93.6 fL (ref 80.0–100.0)
Platelets: 181 10*3/uL (ref 150–400)
RBC: 3.93 MIL/uL (ref 3.87–5.11)
RDW: 14.3 % (ref 11.5–15.5)
WBC: 9.5 10*3/uL (ref 4.0–10.5)
nRBC: 0 % (ref 0.0–0.2)

## 2019-10-26 LAB — BASIC METABOLIC PANEL
Anion gap: 10 (ref 5–15)
BUN: 11 mg/dL (ref 8–23)
CO2: 23 mmol/L (ref 22–32)
Calcium: 8.6 mg/dL — ABNORMAL LOW (ref 8.9–10.3)
Chloride: 102 mmol/L (ref 98–111)
Creatinine, Ser: 0.73 mg/dL (ref 0.44–1.00)
GFR calc Af Amer: >60 mL/min (ref >60)
GFR calc non Af Amer: >60 mL/min (ref >60)
Glucose, Bld: 87 mg/dL (ref 70–99)
Potassium: 3.6 mmol/L (ref 3.5–5.1)
Sodium: 135 mmol/L (ref 135–145)

## 2019-10-26 LAB — PHOSPHORUS: Phosphorus: 2.1 mg/dL — ABNORMAL LOW (ref 2.5–4.6)

## 2019-10-26 LAB — MAGNESIUM: Magnesium: 1.8 mg/dL (ref 1.7–2.4)

## 2019-10-26 MED ORDER — LISINOPRIL 20 MG PO TABS
20.0000 mg | ORAL_TABLET | Freq: Every day | ORAL | Status: DC
  Administered 2019-10-26 – 2019-10-31 (×6): 20 mg via ORAL
  Filled 2019-10-26 (×6): qty 1

## 2019-10-26 NOTE — Progress Notes (Signed)
PROGRESS NOTE  PARNEET GLANTZ HBZ:[id number] DOB: 08-23-1930 DOA: 10/23/2019 PCP: Deland Pretty, MD   LOS: 3 days   Brief narrative: As per HPI,   [name] is a 86 y.o. female with medical history significant of HTN, paroxysmal A. fib on flecainide and aspirin, hypothyroidism, umbilical hernia, presented to the hospital with sudden onset of abdominal pain.  Patient reported her son died last week and she has been feeling very stressful and attributed new onset abdominal pain to the new mental stress. In the ED, Lipase significant high out of reference range.  LFTs, AST 28, ALT 18, bilirubin 0.8.  CT abdomen with contrast showed mild inflammatory changes surrounding the pancreas and duodenum C-loop suggestive of acute pancreatitis with reactive changes of the duodenum. Patient was then admitted to the hospital for further evaluation and treatment.  Assessment/Plan:  Principal Problem:   Acute pancreatitis Active Problems:   Hypothyroidism   Obstructive sleep apnea   Essential hypertension  Acute pancreatitis, likely idiopathic pancreatitis CT abdomen with mild inflammatory changes. MRCP without obstructive pathology. Initial lipase of more than 10,000 which has trended down to 3000>158 at this time. Lipid panel showed normal triglyceride.  Patient has tolerated clears yesterday.  Will start on full liquid diet and gradually advance as tolerated.  Continue IV fluids, analgesia as needed.Marland Kitchen  MRCP without any acute findings peripancreatic edema most notable along the pancreatic tail.  Hold hydrochlorothiazide.  Restart the lisinopril 20 mg daily.  IgG4 was negative.  Mild hypokalemia.  Improved with replacement.  Potassium 3.6 today.  Check BMP in a.m.  Essential hypertension. Accelerated.  On hydralazine, hydrochlorothiazide and lisinopril at home including metoprolol. Hold off with hydrochlorothiazide.  Restart lisinopril today. On  IV hydralazine.  Will closely monitor blood  pressure.  Volume depletion. Continue IV fluids.  Decreased IV fluids yesterday.  Diet will be advanced.  Paroxysmal A. Fib Continue flecainide and metoprolol. IV metoprolol as needed has been added. Not on systemic anticoagulation for personal reasons, patient follows up with cardiology as outpatient. Blood pressure is stable at this time.  Patient follows up with Dr. Ellouise Newer cardiology as outpatient.  Hypothyroidism Continue Synthroid  Hypophosphatemia.  Received IV potassium phosphate on 10/25/2018.  Phosphate level at 2.1.  Start oral potassium phosphate packets.  Check levels in a.m.  Obstructive sleep apnea. Continue CPAP at nighttime.  DVT prophylaxis: SCDs Start: 10/23/19 1849  Code Status: DNR  Family Communication:  None today.   Status is: Inpatient  Remains inpatient appropriate because:Inpatient level of care appropriate due to severity of illness, IV fluids, acute pancreatitis, electrolyte imbalances   Dispo: The patient is from: Home              Anticipated d/c is to: Home              Anticipated d/c date is: 2 days              Patient currently is not medically stable to d/c.  Consultants:  None  Procedures:  MRCP  Antibiotics:   None  Anti-infectives (From admission, onward)   None     Subjective:  Today, patient was seen and examined at bedside.  Patient denies any nausea, vomiting or diarrhea.  Has tolerated clears.  Wishes to be advanced on diet.  Has mild abdominal pain on the left side.  Objective: Vitals:   10/26/19 0634 10/26/19 0859  BP: (!) 141/91 (!) 167/101  Pulse: 91 77  Resp: 18 18  Temp: 97.6 F (36.4 C) 98.1 F (36.7 C)  SpO2: 95% 96%    Intake/Output Summary (Last 24 hours) at 10/26/2019 1107 Last data filed at 10/26/2019 0900 Gross per 24 hour  Intake 2283.14 ml  Output 700 ml  Net 1583.14 ml   Body mass index is 37.42 kg/m.   Physical Exam: GENERAL: Patient is alert awake and oriented. Not in obvious  distress.  Morbidly obese HENT: No scleral pallor or icterus. Pupils equally reactive to light. Oral mucosa is moist NECK: is supple, no gross swelling noted. CHEST: Clear to auscultation. No crackles or wheezes.  Diminished breath sounds bilaterally. CVS: S1 and S2 heard,  Irregular rhythm ABDOMEN: Mild left upper quadrant tenderness on deep palpation, bowel sounds present EXTREMITIES: No edema. CNS: Cranial nerves are intact. No focal motor deficits. SKIN: warm and dry without rashes.  Data Review: I have personally reviewed the following laboratory data and studies,  CBC: Recent Labs  Lab 10/23/19 1352 10/25/19 0455 10/26/19 0342  WBC 9.5 10.2 9.5  HGB 13.7 12.3 11.8*  HCT 43.4 39.4 36.8  MCV 94.8 94.9 93.6  PLT 243 207 277   Basic Metabolic Panel: Recent Labs  Lab 10/23/19 1352 10/24/19 0439 10/25/19 0455 10/26/19 0342  NA 138 138 136 135  K 3.6 3.4* 3.3* 3.6  CL 99 101 102 102  CO2 27 23 24 23   GLUCOSE 121* 105* 76 87  BUN 25* 19 14 11   CREATININE 0.89 0.89 0.89 0.73  CALCIUM 10.1 9.0 8.8* 8.6*  MG  --   --  1.8 1.8  PHOS  --   --  1.9* 2.1*   Liver Function Tests: Recent Labs  Lab 10/23/19 1352 10/24/19 0439 10/25/19 0455  AST 28 28 44*  ALT 18 18 22   ALKPHOS 64 49 46  BILITOT 0.9 0.9 1.2  PROT 7.7 6.5 6.3*  ALBUMIN 4.3 3.5 3.2*   Recent Labs  Lab 10/23/19 1352 10/24/19 0439 10/25/19 0455  LIPASE >10,000* 3,095* 158*   No results for input(s): AMMONIA in the last 168 hours. Cardiac Enzymes: No results for input(s): CKTOTAL, CKMB, CKMBINDEX, TROPONINI in the last 168 hours. BNP (last 3 results) No results for input(s): BNP in the last 8760 hours.  ProBNP (last 3 results) No results for input(s): PROBNP in the last 8760 hours.  CBG: No results for input(s): GLUCAP in the last 168 hours. Recent Results (from the past 240 hour(s))  SARS Coronavirus 2 by RT PCR (hospital order, performed in [facility] hospital lab) Nasopharyngeal  Nasopharyngeal Swab     Status: None   Collection Time: 10/23/19  5:42 PM   Specimen: Nasopharyngeal Swab  Result Value Ref Range Status   SARS Coronavirus 2 NEGATIVE NEGATIVE Final    Comment: (NOTE) SARS-CoV-2 target nucleic acids are NOT DETECTED.  The SARS-CoV-2 RNA is generally detectable in upper and lower respiratory specimens during the acute phase of infection. The lowest concentration of SARS-CoV-2 viral copies this assay can detect is 250 copies / mL. A negative result does not preclude SARS-CoV-2 infection and should not be used as the sole basis for treatment or other patient management decisions.  A negative result may occur with improper specimen collection / handling, submission of specimen other than nasopharyngeal swab, presence of viral mutation(s) within the areas targeted by this assay, and inadequate number of viral copies (<250 copies / mL). A negative result must be combined with clinical observations, patient history, and epidemiological information.  Fact Sheet for Patients:  StrictlyIdeas.no  Fact Sheet for Healthcare Providers: BankingDealers.co.za  This test is not yet approved or  cleared by the [place] FDA and has been authorized for detection and/or diagnosis of SARS-CoV-2 by FDA under an Emergency Use Authorization (EUA).  This EUA will remain in effect (meaning this test can be used) for the duration of the COVID-19 declaration under Section 564(b)(1) of the Act, 21 U.S.C. section 360bbb-3(b)(1), unless the authorization is terminated or revoked sooner.  Performed at [facility], [place] [address] [place], [place] [address]      Studies: MR ABDOMEN MRCP WO CONTRAST  Result Date: 10/24/2019 CLINICAL DATA:  Elevated lipase levels, clinical suspicion for pancreatitis. EXAM: MRI ABDOMEN WITHOUT CONTRAST  (INCLUDING MRCP) TECHNIQUE: Multiplanar multisequence MR imaging of the abdomen was  performed. Heavily T2-weighted images of the biliary and pancreatic ducts were obtained, and three-dimensional MRCP images were rendered by post processing. COMPARISON:  CT abdomen 10/23/2019 FINDINGS: Despite efforts by the technologist and patient, motion artifact is present on today's exam and could not be eliminated. This reduces exam sensitivity and specificity. Lower chest: Increasing dependent atelectasis in both lower lobes. Mild cardiomegaly. Trace right pleural effusion, new compared to yesterday. Hepatobiliary: Unremarkable.  No biliary dilatation. Pancreas: Peripancreatic edema, most notable along the pancreatic tail, probably a manifestation of pancreatitis. No dorsal pancreatic duct dilatation. Normal pancreatic duct branching pattern. Spleen:  Unremarkable Adrenals/Urinary Tract: Bosniak category 2 cyst of the right kidney lower pole. Additional benign bilateral renal cysts. Adrenal glands unremarkable. Stomach/Bowel: Unremarkable Vascular/Lymphatic: Aortoiliac atherosclerotic vascular disease. No pathologic adenopathy identified. Other:  Trace perihepatic and perisplenic ascites. Musculoskeletal: Lumbar spondylosis and degenerative disc disease. Mild dextroconvex lower thoracic scoliosis and mild levoconvex lumbar scoliosis with rotary component. IMPRESSION: 1. Peripancreatic edema, most notable along the pancreatic tail, probably a manifestation of pancreatitis. No dorsal pancreatic duct dilatation. No appreciable abscess or pseudocyst. 2. No biliary dilatation. 3. Trace perihepatic and perisplenic ascites. 4. Increasing dependent atelectasis in both lower lobes. Trace right pleural effusion. 5. Lumbar spondylosis and degenerative disc disease. 6.  Aortic Atherosclerosis (ICD10-I70.0). Electronically Signed   By: Van Clines M.D.   On: 10/24/2019 15:37      Flora Lipps, MD  [facility] 10/26/2019

## 2019-10-26 NOTE — Progress Notes (Signed)
Patient transferred to chair with no complications. Tolerating a full liquid diet. Will advance for dinner.

## 2019-10-26 NOTE — Progress Notes (Signed)
RT placed patient on home CPAP. Patient tolerating well at this time. RT will monitor as needed. 

## 2019-10-27 LAB — COMPREHENSIVE METABOLIC PANEL
ALT: 22 U/L (ref 0–44)
AST: 35 U/L (ref 15–41)
Albumin: 2.8 g/dL — ABNORMAL LOW (ref 3.5–5.0)
Alkaline Phosphatase: 47 U/L (ref 38–126)
Anion gap: 10 (ref 5–15)
BUN: 7 mg/dL — ABNORMAL LOW (ref 8–23)
CO2: 25 mmol/L (ref 22–32)
Calcium: 8.6 mg/dL — ABNORMAL LOW (ref 8.9–10.3)
Chloride: 103 mmol/L (ref 98–111)
Creatinine, Ser: 0.69 mg/dL (ref 0.44–1.00)
GFR calc Af Amer: >60 mL/min (ref >60)
GFR calc non Af Amer: >60 mL/min (ref >60)
Glucose, Bld: 115 mg/dL — ABNORMAL HIGH (ref 70–99)
Potassium: 3.7 mmol/L (ref 3.5–5.1)
Sodium: 138 mmol/L (ref 135–145)
Total Bilirubin: 1.2 mg/dL (ref 0.3–1.2)
Total Protein: 5.7 g/dL — ABNORMAL LOW (ref 6.5–8.1)

## 2019-10-27 LAB — CBC
HCT: 35.3 % — ABNORMAL LOW (ref 36.0–46.0)
Hemoglobin: 11.5 g/dL — ABNORMAL LOW (ref 12.0–15.0)
MCH: 30.2 pg (ref 26.0–34.0)
MCHC: 32.6 g/dL (ref 30.0–36.0)
MCV: 92.7 fL (ref 80.0–100.0)
Platelets: 183 10*3/uL (ref 150–400)
RBC: 3.81 MIL/uL — ABNORMAL LOW (ref 3.87–5.11)
RDW: 14.4 % (ref 11.5–15.5)
WBC: 7.4 10*3/uL (ref 4.0–10.5)
nRBC: 0 % (ref 0.0–0.2)

## 2019-10-27 LAB — PHOSPHORUS: Phosphorus: 2.2 mg/dL — ABNORMAL LOW (ref 2.5–4.6)

## 2019-10-27 LAB — MAGNESIUM: Magnesium: 1.7 mg/dL (ref 1.7–2.4)

## 2019-10-27 LAB — GLUCOSE, CAPILLARY: Glucose-Capillary: 95 mg/dL (ref 70–99)

## 2019-10-27 NOTE — TOC Transition Note (Deleted)
Transition of Care [facility]) - CM/SW Discharge Note   Patient Details  Name: Brianna Reyes MRN: [id number] Date of Birth: 09/02/1930  Transition of Care [facility]) CM/SW Contact:  Verdell Carmine, RN Phone Number: 10/27/2019, 1:29 PM   Clinical Narrative:     Ready for discharge  [place] set up for [facility] DME via [employer]    Barriers to Discharge: Continued Medical Work up   Patient Goals and CMS Choice Patient states their goals for this hospitalization and ongoing recovery are:: return home with family support CMS Medicare.gov Compare Post Acute Care list provided to:: Patient Choice offered to / list presented to : Patient, Adult Children  Discharge Placement             Home with [place] DME          Discharge Plan and Services In-house Referral: [facility] Discharge Planning Services: CM Consult Post Acute Care Choice: Home Health, Durable Medical Equipment          DME Arranged: Wheelchair manual DME Agency: [employer] Date DME Agency Contacted: 10/27/19 Time DME Agency Contacted: [phone] Representative spoke with at DME Agency: Thedore Mins HH Arranged: PT, OT, Social Work          Social Determinants of Health ([place]) Interventions     Readmission Risk Interventions No flowsheet data found.

## 2019-10-27 NOTE — Evaluation (Signed)
Physical Therapy Evaluation Patient Details Name: Brianna Reyes MRN: [id number] DOB: 06-08-1930 Today's Date: 10/27/2019   History of Present Illness  Brianna Reyes is a 86 y.o. female with medical history significant of HTN, paroxysmal A. fib on flecainide and aspirin, hypothyroidism, umbilical hernia, presented to the hospital with sudden onset of abdominal pain.  acute pancreatitis with reactive changes of the duodenum  Clinical Impression   Pt admitted with above diagnosis. At baseline, pt lives alone; Her primary support was her son, who was local and visited and assisted her daily until he passed away last week; She has 2 daughters who live in [place], and they are aware of the need for more support for Brianna Reyes at home; Pt presents with weakness, decr muscle endurance, decr functional mobility, incr fall risk; Walked short distance with me with RW; physical assist to successfully reach full standing; She is very motivated to return home, near independent at baseline; Highly recommend CIR stay for post-acute rehab to maximize independence and safety with mobility prior to getting home; I believe her advanced age will bolster the case for CIR; Pt currently with functional limitations due to the deficits listed below (see PT Problem List). Pt will benefit from skilled PT to increase their independence and safety with mobility to allow discharge to the venue listed below.       Follow Up Recommendations CIR    Equipment Recommendations  Rolling walker with 5" wheels;3in1 (PT) (may already have)    Recommendations for Other Services OT consult (as ordered)     Precautions / Restrictions Precautions Precautions: Fall Restrictions Weight Bearing Restrictions: No      Mobility  Bed Mobility               General bed mobility comments: up in recliner on entry  Transfers Overall transfer level: Needs assistance Equipment used: Rolling walker (2 wheeled) Transfers: Sit to/from  Stand Sit to Stand: Min assist         General transfer comment: Min assist for help with forward weight shift to get center of mass over her feet  Ambulation/Gait Ambulation/Gait assistance: Min assist;+2 safety/equipment (chair push) Gait Distance (Feet): 40 Feet Assistive device: Rolling walker (2 wheeled) Gait Pattern/deviations: Decreased step length - right;Decreased step length - left;Decreased stride length;Trunk flexed     General Gait Details: Cues to self-monitor for activity tolerance and for RW proximity and posture; notable DOE 2/4; reported feeling weak - no overt knee buckling noted  Stairs            Wheelchair Mobility    Modified Rankin (Stroke Patients Only)       Balance Overall balance assessment: Needs assistance;History of Falls Sitting-balance support: No upper extremity supported;Feet supported Sitting balance-Leahy Scale: Fair     Standing balance support: Single extremity supported;During functional activity Standing balance-Leahy Scale: Poor Standing balance comment: fair static standing at sink, use of external support for dynamic challenges                             Pertinent Vitals/Pain Pain Assessment: Faces Faces Pain Scale: Hurts a little bit Pain Location: R knee with weight bearing; reports belly pain is much better Pain Descriptors / Indicators: Aching Pain Intervention(s): Monitored during session    Home Living Family/patient expects to be discharged to:: Private residence Living Arrangements: Alone Available Help at Discharge: Family;Available PRN/intermittently (daughters live in [place], they are working on getting  more assist for pt) Type of Home: House Home Access: Stairs to enter Entrance Stairs-Rails:  (to be determined) Entrance Stairs-Number of Steps: 4 Home Layout: One level Home Equipment: Cane - single point;Walker - 2 wheels;Tub bench;Grab bars - tub/shower (reacher) Additional Comments: Pt  reports one fall (not recent) in garden outside, no injuries. Pt reports her children alternate who stays with her (her son recently passed and was a Engineer, water to pt). Family initiating additional assistance through [facility] (can come 4x/week and were to begin services this week) (She has a Life Alert-type device)    Prior Function Level of Independence: Independent with assistive device(s)         Comments: Able to use RW or cane for mobility in the home. Pt reports Modified Independence with ADLs and IADLs (cooking, laundry, med mgmt, bills) (Per her daughter, she needs encouragement to use her RW)     Hand Dominance   Dominant Hand: Right    Extremity/Trunk Assessment   Upper Extremity Assessment Upper Extremity Assessment: Defer to OT evaluation    Lower Extremity Assessment Lower Extremity Assessment: Generalized weakness;RLE deficits/detail RLE Deficits / Details: Notable ache in R knee with weight bearing while walking; Pt di dnot indicate she has arthritis    Cervical / Trunk Assessment Cervical / Trunk Assessment: Kyphotic  Communication   Communication: No difficulties  Cognition Arousal/Alertness: Awake/alert Behavior During Therapy: WFL for tasks assessed/performed Overall Cognitive Status: Within Functional Limits for tasks assessed (for simple mobility activities) Area of Impairment: Safety/judgement                   Current Attention Level: Selective     Safety/Judgement: Decreased awareness of safety;Decreased awareness of deficits   Problem Solving: Requires verbal cues;Difficulty sequencing General Comments: Pt A&Ox4. Reports level entry, but daughter entering at end of session and reports 4 steps to enter home. Pt distractible and requires cues to attend to task with tangential conversation. Some decreased implementation of safety techniques (was putting pants around feet in standing at home)      General Comments General  comments (skin integrity, edema, etc.): Daughter Brianna Reyes present throughout session    Exercises     Assessment/Plan    PT Assessment Patient needs continued PT services  PT Problem List Decreased strength;Decreased activity tolerance;Decreased balance;Decreased mobility;Decreased coordination;Decreased knowledge of use of DME;Decreased safety awareness;Decreased knowledge of precautions       PT Treatment Interventions DME instruction;Gait training;Stair training;Functional mobility training;Therapeutic activities;Therapeutic exercise;Balance training;Patient/family education    PT Goals (Current goals can be found in the Care Plan section)  Acute Rehab PT Goals Patient Stated Goal: wants to go home PT Goal Formulation: With patient/family Time For Goal Achievement: 11/10/19 Potential to Achieve Goals: Good    Frequency Min 3X/week   Barriers to discharge Decreased caregiver support Daughters are working to arrange for near 24 hour support; Son who was her primary support passed away last week    Co-evaluation               AM-PAC PT "6 Clicks" Mobility  Outcome Measure Help needed turning from your back to your side while in a flat bed without using bedrails?: A Little Help needed moving from lying on your back to sitting on the side of a flat bed without using bedrails?: A Little Help needed moving to and from a bed to a chair (including a wheelchair)?: A Little Help needed standing up from a chair using your arms (e.g.,  wheelchair or bedside chair)?: A Little Help needed to walk in hospital room?: A Little Help needed climbing 3-5 steps with a railing? : A Lot 6 Click Score: 17    End of Session Equipment Utilized During Treatment: Gait belt Activity Tolerance: Patient tolerated treatment well Patient left: in chair;with call bell/phone within reach;with chair alarm set Nurse Communication: Mobility status PT Visit Diagnosis: Unsteadiness on feet (R26.81);Other  abnormalities of gait and mobility (R26.89);Muscle weakness (generalized) (M62.81)    Time: 3276-1470 PT Time Calculation (min) (ACUTE ONLY): 31 min   Charges:   PT Evaluation $PT Eval Moderate Complexity: 1 Mod PT Treatments $Gait Training: 8-22 mins        Roney Marion, PT  Acute Rehabilitation Services Pager [phone] Office [place] 10/27/2019, 10:50 AM

## 2019-10-27 NOTE — TOC Initial Note (Signed)
Transition of Care [facility]) - Initial/Assessment Note    Patient Details  Name: Brianna Reyes MRN: [id number] Date of Birth: May 17, 1930  Transition of [facility]) CM/SW Contact:    Bartholomew Crews, RN Phone Number: [phone] 10/27/2019, 12:12 PM  Clinical Narrative:                  Spoke with patient and daughter, [name], at the bedside to discuss transition planning. PTA home alone. Her son, Randall Hiss, passed away suddenly last week, and he was the one who assisted her. She has 3 other children who are supportive, however, they do not live locally.   [name] and her sister are working to arrange home care assistance with [employer]. NCM provided [name] with contact information for [employer] [facility] [place].   Discussed HH for skilled therapy, PT and OT. Agreeable. Agency choice list provided. Referral pending acceptance.  Discussed DME. Patient already has a RW, canes, and 3N1. Patient will benefit from lightweight manual wheelchair. Patient and daughter agreeable to using [facility]. Referral accepted for delivery to the room.   [name] to provide transportation home at time of discharge. [name] expressed concerns about patient getting up 3-4 steps into house. Spoke with PT who will plan to work on stairs during session tomorrow.   TOC following for transition needs.   Expected Discharge Plan: [place] Barriers to Discharge: Continued Medical Work up   Patient Goals and CMS Choice Patient states their goals for this hospitalization and ongoing recovery are:: return home with family support CMS Medicare.gov Compare Post Acute Care list provided to:: Patient Choice offered to / list presented to : Patient, Adult Children  Expected Discharge Plan and Services Expected Discharge Plan: [place] In-house Referral: [facility] Discharge Planning Services: CM Consult Post Acute Care Choice: Home Health, Durable Medical Equipment Living arrangements for the  past 2 months: Single Family Home                 DME Arranged: Wheelchair manual DME Agency: [facility] Date DME Agency Contacted: 10/27/19 Time DME Agency Contacted: [id number] Representative spoke with at DME Agency: Thedore Mins HH Arranged: PT, OT, Social Work          Prior Living Arrangements/Services Living arrangements for the past 2 months: [place] with:: Self Patient language and need for interpreter reviewed:: Yes Do you feel safe going back to the place where you live?: Yes      Need for Family Participation in Patient Care: Yes (Comment) Care giver support system in place?: Yes (comment) Current home services: DME Criminal Activity/Legal Involvement Pertinent to Current Situation/Hospitalization: No - Comment as needed  Activities of Daily Living      Permission Sought/Granted Permission sought to share information with : Family Supports Permission granted to share information with : Yes, Verbal Permission Granted  Share Information with NAME: [name]     Permission granted to share info w Relationship: daughter     Emotional Assessment Appearance:: Appears stated age Attitude/Demeanor/Rapport: Engaged Affect (typically observed): Accepting Orientation: : Oriented to Self, Oriented to  Time, Oriented to Place, Oriented to Situation Alcohol / Substance Use: Not Applicable Psych Involvement: No (comment)  Admission diagnosis:  Pancreatitis [K85.90] Acute pancreatitis without infection or necrosis, unspecified pancreatitis type [K85.90] Patient Active Problem List   Diagnosis Date Noted  . Acute pancreatitis 10/23/2019  . Pedal edema   . Essential hypertension   . Complex sleep apnea syndrome 06/27/2014  .  Aortic valve disease 03/19/2014  . HTN (hypertension) 02/24/2013  . Pyelonephritis 09/19/2012  . UTI (lower urinary tract infection) 09/19/2012  . Obstructive sleep apnea 06/03/2008  . BENIGN NEOPLASM OF BRAIN 05/18/2008  . Hypothyroidism  05/18/2008  . ANXIETY STATE, UNSPECIFIED 05/18/2008  . PALPITATIONS 05/18/2008  . HYPERTENSION NEC 05/18/2008  . PAROXYSMAL NOCTURNAL DYSPNEA 05/15/2008   PCP:  Deland Pretty, MD Pharmacy:   [id number] - [place], [address]. AT [address] [place] [address] Phone: [phone] Fax: [phone]     Social Determinants of Health (SDOH) Interventions    Readmission Risk Interventions No flowsheet data found.

## 2019-10-27 NOTE — Care Management Important Message (Signed)
Important Message  Patient Details  Name: Brianna Reyes MRN: [id number] Date of Birth: 10-25-1930   Medicare Important Message Given:  Yes     [name] [address] 10/27/2019, 3:16 PM

## 2019-10-27 NOTE — NC FL2 (Addendum)
[place] MEDICAID FL2 LEVEL OF CARE SCREENING TOOL     IDENTIFICATION  Patient Name: Brianna Reyes Birthdate: June 20, 1930 Sex: female Admission Date (Current Location): 10/23/2019  [facility] and [place] Number:  Herbalist and Address:  [facility], [place] [address], [place], [place] [address]      Provider Number: [id number]  Attending Physician Name and Address:  Flora Lipps, MD  Relative Name and Phone Number:  Thomasene Lot [phone]    Current Level of Care: Hospital Recommended Level of Care: [place] Prior Approval Number:    Date Approved/Denied:   PASRR Number: [id number]  Discharge Plan: SNF    Current Diagnoses: Patient Active Problem List   Diagnosis Date Noted  . Acute pancreatitis 10/23/2019  . Pedal edema   . Essential hypertension   . Complex sleep apnea syndrome 06/27/2014  . Aortic valve disease 03/19/2014  . HTN (hypertension) 02/24/2013  . Pyelonephritis 09/19/2012  . UTI (lower urinary tract infection) 09/19/2012  . Obstructive sleep apnea 06/03/2008  . BENIGN NEOPLASM OF BRAIN 05/18/2008  . Hypothyroidism 05/18/2008  . ANXIETY STATE, UNSPECIFIED 05/18/2008  . PALPITATIONS 05/18/2008  . HYPERTENSION NEC 05/18/2008  . PAROXYSMAL NOCTURNAL DYSPNEA 05/15/2008    Orientation RESPIRATION BLADDER Height & Weight     Self, Time, Situation, Place  Normal, Other (Home CPAP) Continent, External catheter Weight: 204 lb 9.4 oz (92.8 kg) Height:     BEHAVIORAL SYMPTOMS/MOOD NEUROLOGICAL BOWEL NUTRITION STATUS      Continent Diet (See dc summary)  AMBULATORY STATUS COMMUNICATION OF NEEDS Skin   Limited Assist Verbally                         Personal Care Assistance Level of Assistance  Bathing, Feeding, Dressing Bathing Assistance: Limited assistance Feeding assistance: Independent Dressing Assistance: Limited assistance     Functional Limitations Info  Sight, Hearing, Speech Sight  Info: Impaired Hearing Info: Adequate Speech Info: Adequate    SPECIAL CARE FACTORS FREQUENCY  PT (By licensed PT), OT (By licensed OT)     PT Frequency: 5X week OT Frequency: 5X week            Contractures Contractures Info: Not present    Additional Factors Info  Code Status, Allergies Code Status Info: DNR Allergies Info: NKA           Current Medications (10/27/2019):  This is the current hospital active medication list Current Facility-Administered Medications  Medication Dose Route Frequency Provider Last Rate Last Admin  . flecainide (TAMBOCOR) tablet 50 mg  50 mg Oral BID Wynetta Fines T, MD   50 mg at 10/27/19 0913  . hydrALAZINE (APRESOLINE) injection 10 mg  10 mg Intravenous Q6H PRN Pokhrel, Laxman, MD      . levothyroxine (SYNTHROID) tablet 100 mcg  100 mcg Oral q morning - 10a Lequita Halt, MD   100 mcg at 10/27/19 7782  . lisinopril (ZESTRIL) tablet 20 mg  20 mg Oral Daily Pokhrel, Laxman, MD   20 mg at 10/27/19 0913  . LORazepam (ATIVAN) tablet 0.5 mg  0.5 mg Oral Q4H PRN Pokhrel, Laxman, MD   0.5 mg at 10/26/19 0916  . metoprolol succinate (TOPROL-XL) 24 hr tablet 50 mg  50 mg Oral Daily Wynetta Fines T, MD   50 mg at 10/27/19 0912  . metoprolol tartrate (LOPRESSOR) injection 5 mg  5 mg Intravenous Q6H PRN Pokhrel, Laxman, MD   5 mg  at 10/25/19 0502  . multivitamin with minerals tablet 1 tablet  1 tablet Oral Daily Lequita Halt, MD   1 tablet at 10/27/19 0912  . ondansetron (ZOFRAN) injection 4 mg  4 mg Intravenous Q6H PRN Wynetta Fines T, MD   4 mg at 10/26/19 0914  . potassium & sodium phosphates (PHOS-NAK) 280-160-250 MG packet 1 packet  1 packet Oral TID WC & HS Pokhrel, Laxman, MD   1 packet at 10/27/19 3646     Discharge Medications: Please see discharge summary for a list of discharge medications.  Relevant Imaging Results:  Relevant Lab Results:   Additional Information ssn [place] Dargan, LCSWA

## 2019-10-27 NOTE — Evaluation (Signed)
Occupational Therapy Evaluation Patient Details Name: Brianna Reyes MRN: [id number] DOB: 10/26/1930 Today's Date: 10/27/2019    History of Present Illness Brianna Reyes is a 86 y.o. female with medical history significant of HTN, paroxysmal A. fib on flecainide and aspirin, hypothyroidism, umbilical hernia, presented to the hospital with sudden onset of abdominal pain.  acute pancreatitis with reactive changes of the duodenum   Clinical Impression   PTA, pt lives alone but children rotate staying with pt. Family also initiating additional assistance through [employer]. Pt typically Modified Independent for ADLs, IADLs, and mobility using RW. Pt reports one fall that occurred outside. Pt presents now with minor deficits in strength, dynamic standing balance, endurance, and safety awareness. Pt requires Supervision for UB ADLs, up to Mod A for LB ADLs and grossly Min guard for transfers/short distance mobility with RW. If 24/7 supervision available for pt, recommend [facility] [place] aide. If 24/7 cannot be provided, recommend SNF for short term rehab to maximize safety with daily tasks.     Follow Up Recommendations  Home health OT;Supervision/Assistance - 24 hour;Other (comment) (SNF if 24/7 assist not available)    Equipment Recommendations  3 in 1 bedside commode    Recommendations for Other Services  HH aide     Precautions / Restrictions Precautions Precautions: Fall Restrictions Weight Bearing Restrictions: No      Mobility Bed Mobility               General bed mobility comments: up in recliner on entry  Transfers Overall transfer level: Needs assistance Equipment used: Rolling walker (2 wheeled) Transfers: Sit to/from Stand Sit to Stand: Min guard         General transfer comment: min guard for safety, no physical assist needed. Pt implementing safe hand placement without cues    Balance Overall balance assessment: Needs assistance;History of  Falls Sitting-balance support: No upper extremity supported;Feet supported Sitting balance-Leahy Scale: Fair     Standing balance support: Single extremity supported;During functional activity Standing balance-Leahy Scale: Fair Standing balance comment: fair static standing at sink, use of external support for dynamic challenges                           ADL either performed or assessed with clinical judgement   ADL Overall ADL's : Needs assistance/impaired Eating/Feeding: Independent;Sitting   Grooming: Min guard;Standing;Oral care;Wash/dry face Grooming Details (indicate cue type and reason): Min guard progressing to supervision standing at sink for brushing teeth, no LOB Upper Body Bathing: Supervision/ safety;Sitting   Lower Body Bathing: Minimal assistance;Sit to/from stand   Upper Body Dressing : Supervision/safety;Sitting   Lower Body Dressing: Moderate assistance;Sit to/from stand Lower Body Dressing Details (indicate cue type and reason): Grossly Mod A for donning around feet safely. Pt Max A to don socks, reports usually wearing slippers Toilet Transfer: Min guard;Ambulation;BSC;RW Toilet Transfer Details (indicate cue type and reason): simulated in room Toileting- Clothing Manipulation and Hygiene: Min guard;Sit to/from stand       Functional mobility during ADLs: Min guard;Rolling walker General ADL Comments: Pt with mild deficits in strength, dynamic standing balance, and endurance. Pt does ask good questions about safety during daily tasks, receptive of all education     Vision Baseline Vision/History: Wears glasses Wears Glasses: Reading only Patient Visual Report: No change from baseline Vision Assessment?: No apparent visual deficits     Perception     Praxis      Pertinent Vitals/Pain  Pain Assessment: No/denies pain     Hand Dominance Right   Extremity/Trunk Assessment Upper Extremity Assessment Upper Extremity Assessment: Generalized  weakness   Lower Extremity Assessment Lower Extremity Assessment: Defer to PT evaluation   Cervical / Trunk Assessment Cervical / Trunk Assessment: Kyphotic   Communication Communication Communication: No difficulties   Cognition Arousal/Alertness: Awake/alert Behavior During Therapy: WFL for tasks assessed/performed Overall Cognitive Status: No family/caregiver present to determine baseline cognitive functioning Area of Impairment: Problem solving;Attention;Safety/judgement                   Current Attention Level: Selective     Safety/Judgement: Decreased awareness of safety   Problem Solving: Requires verbal cues;Difficulty sequencing General Comments: Pt A&Ox4. Reports level entry, but daughter entering at end of session and reports 4 steps to enter home. Pt distractible and requires cues to attend to task with tangential conversation. Some decreased implementation of safety techniques (was putting pants around feet in standing at home)   General Comments  Daughter entering at end of OT session    Exercises     Shoulder Instructions      Home Living Family/patient expects to be discharged to:: Private residence Living Arrangements: Alone Available Help at Discharge: Family;Available PRN/intermittently Type of Home: House Home Access: Stairs to enter Entrance Stairs-Number of Steps: 4   Home Layout: One level     Bathroom Shower/Tub: Tub/shower unit (low tub)   Bathroom Toilet: Handicapped height (toilet riser on top)     Home Equipment: Cane - single point;Walker - 2 wheels;Tub bench;Grab bars - tub/shower (reacher)   Additional Comments: Pt reports one fall (not recent) in garden outside, no injuries. Pt reports her children alternate who stays with her (her son recently passed and was a Engineer, water to pt). Family initiating additional assistance through [facility] (can come 4x/week and were to begin services this week)      Prior  Functioning/Environment Level of Independence: Independent with assistive device(s)        Comments: Able to use RW or cane for mobility in the home. Pt reports Modified Independence with ADLs and IADLs (cooking, laundry, med mgmt, bills)        OT Problem List: Decreased strength;Decreased activity tolerance;Impaired balance (sitting and/or standing);Decreased safety awareness;Decreased cognition;Decreased knowledge of use of DME or AE      OT Treatment/Interventions: Self-care/ADL training;Therapeutic exercise;Energy conservation;DME and/or AE instruction;Therapeutic activities;Patient/family education    OT Goals(Current goals can be found in the care plan section) Acute Rehab OT Goals Patient Stated Goal: feel ready to go home OT Goal Formulation: With patient/family Time For Goal Achievement: 11/10/19 Potential to Achieve Goals: Good ADL Goals Pt Will Perform Grooming: with modified independence;standing Pt Will Perform Lower Body Bathing: with modified independence;sit to/from stand Pt Will Perform Lower Body Dressing: with modified independence;sit to/from stand Pt Will Transfer to Toilet: with modified independence;ambulating;bedside commode Pt Will Perform Toileting - Clothing Manipulation and hygiene: with modified independence;sit to/from stand Additional ADL Goal #1: Pt to verbalize at least 3 fall prevention strategies  OT Frequency: Min 2X/week   Barriers to D/C:            Co-evaluation              AM-PAC OT "6 Clicks" Daily Activity     Outcome Measure Help from another person eating meals?: None Help from another person taking care of personal grooming?: A Little Help from another person toileting, which includes using toliet, bedpan, or  urinal?: A Little Help from another person bathing (including washing, rinsing, drying)?: A Little Help from another person to put on and taking off regular upper body clothing?: A Little Help from another person to  put on and taking off regular lower body clothing?: A Lot 6 Click Score: 18   End of Session Equipment Utilized During Treatment: Gait belt;Rolling walker Nurse Communication: Mobility status  Activity Tolerance: Patient tolerated treatment well Patient left: in chair;with call bell/phone within reach;with chair alarm set;with family/visitor present  OT Visit Diagnosis: Unsteadiness on feet (R26.81);Other abnormalities of gait and mobility (R26.89);Muscle weakness (generalized) (M62.81);History of falling (Z91.81)                Time: 4431-5400 OT Time Calculation (min): 35 min Charges:  OT General Charges $OT Visit: 1 Visit OT Evaluation $OT Eval Moderate Complexity: 1 Mod OT Treatments $Self Care/Home Management : 8-22 mins  Layla Maw, OTR/L  Layla Maw 10/27/2019, 8:57 AM

## 2019-10-27 NOTE — Progress Notes (Addendum)
Inpatient Rehab Admissions Coordinator Note:   Per therapy recommendations, pt was screened for CIR candidacy by Clemens Catholic, [place] CCC-SLP. At this time, Pt. Appears to have functional decline but does not demonstrate medical necessity for CIR admission. Pt. May be re-screened  if Pt. has a change of status or if medical complexity increases.   Clemens Catholic, [place], [place] Admissions Coordinator  [phone] ([place]) [phone] (office)

## 2019-10-27 NOTE — Progress Notes (Signed)
PROGRESS NOTE  Brianna Reyes EXH:[id number] DOB: [birth year] DOA: 10/23/2019 PCP: Deland Pretty, MD   LOS: 4 days   Brief narrative: As per HPI,   Brianna Reyes is a 86 y.o. female with medical history significant of HTN, paroxysmal A. fib on flecainide and aspirin, hypothyroidism, umbilical hernia, presented to the hospital with sudden onset of abdominal pain.  Patient reported her son died last week and she has been feeling very stressful and attributed new onset abdominal pain to the new mental stress. In the ED, Lipase significant high out of reference range.  LFTs, AST 28, ALT 18, bilirubin 0.8.  CT abdomen with contrast showed mild inflammatory changes surrounding the pancreas and duodenum C-loop suggestive of acute pancreatitis with reactive changes of the duodenum. Patient was then admitted to the hospital for further evaluation and treatment.  Assessment/Plan:  Principal Problem:   Acute pancreatitis Active Problems:   Hypothyroidism   Obstructive sleep apnea   Essential hypertension  Acute pancreatitis, likely idiopathic pancreatitis CT abdomen with mild inflammatory changes. MRCP without obstructive pathology. Initial lipase of more than 10,000 which has trended down to 3000>158 at this time. Lipid panel showed normal triglyceride.  Patient has tolerated oral diet at this time.   MRCP without any acute findings peripancreatic edema most notable along the pancreatic tail.  Started lisinopril 20 mg since yesterday.  HCTZ still on hold.  IgG4 was negative.  Mild hypokalemia.  Improved with replacement.  Potassium 3.7 today.  Check BMP in a.m.  Essential hypertension. Better today..  On hydralazine, hydrochlorothiazide, lisinopril and metoprolol at home..  Continue to hold off with hydrochlorothiazide.  Restarted lisinopril on 10/26/19.   Volume depletion. Improved with IV fluids.  Currently on decreased IV fluids.  Will discontinue today.  Encourage oral hydration  Paroxysmal A.  Fib Continue flecainide and metoprolol. Not on systemic anticoagulation for personal reasons, patient follows up with cardiology as outpatient.  Patient follows up with Dr. Ellouise Newer cardiology as outpatient.  Hypothyroidism Continue Synthroid  Hypophosphatemia.  Improved.  On oral potassium phosphate at this time.  Obstructive sleep apnea. Continue CPAP at nighttime.  DVT prophylaxis: SCDs Start: 10/23/19 1849   Code Status: DNR  Family Communication:  None today.   Status is: Inpatient  Remains inpatient appropriate because:Inpatient level of care appropriate due to severity of illness, debility deconditioning likely need home health/skilled nursing facility  Dispo: The patient is from: Home              Anticipated d/c is to: Home health/skilled nursing facility              Anticipated d/c date is: 1-2 days              Patient currently is not medically stable to d/c.  Consultants:  None  Procedures:  MRCP  Antibiotics:  . None  Anti-infectives (From admission, onward)   None     Subjective:  Today, patient was seen and examined at bedside.  Patient denies any nausea, vomiting or abdominal pain.  Has tolerated oral diet.   Objective: Vitals:   10/27/19 0530 10/27/19 0820  BP: 135/81 140/78  Pulse: 71 80  Resp: 18 18  Temp: (!) 97.5 F (36.4 C) 98 F (36.7 C)  SpO2: 94% 95%    Intake/Output Summary (Last 24 hours) at 10/27/2019 1359 Last data filed at 10/27/2019 0600 Gross per 24 hour  Intake 602.33 ml  Output 400 ml  Net 202.33 ml   Body  mass index is 37.42 kg/m.   Physical Exam: General: Morbidly obese, not in obvious distress, morbidly obese.  Alert awake, oriented. HENT:   No scleral pallor or icterus noted. Oral mucosa is moist.  Chest:  Clear breath sounds.  Diminished breath sounds bilaterally. No crackles or wheezes.  CVS: S1 &S2 heard. No murmur.  Regular rate and rhythm. Abdomen: Soft, nontender, nondistended.  Bowel sounds are  heard.   Extremities: No cyanosis, clubbing or edema.  Peripheral pulses are palpable. Psych: Alert, awake and oriented, normal mood CNS:  No cranial nerve deficits.  Power equal in all extremities.   Skin: Warm and dry.  No rashes noted.  Data Review: I have personally reviewed the following laboratory data and studies,  CBC: Recent Labs  Lab 10/23/19 1352 10/25/19 0455 10/26/19 0342 10/27/19 0300  WBC 9.5 10.2 9.5 7.4  HGB 13.7 12.3 11.8* 11.5*  HCT 43.4 39.4 36.8 35.3*  MCV 94.8 94.9 93.6 92.7  PLT 243 207 181 793   Basic Metabolic Panel: Recent Labs  Lab 10/23/19 1352 10/24/19 0439 10/25/19 0455 10/26/19 0342 10/27/19 0300  NA 138 138 136 135 138  K 3.6 3.4* 3.3* 3.6 3.7  CL 99 101 102 102 103  CO2 27 23 24 23 25   GLUCOSE 121* 105* 76 87 115*  BUN 25* 19 14 11  7*  CREATININE 0.89 0.89 0.89 0.73 0.69  CALCIUM 10.1 9.0 8.8* 8.6* 8.6*  MG  --   --  1.8 1.8 1.7  PHOS  --   --  1.9* 2.1* 2.2*   Liver Function Tests: Recent Labs  Lab 10/23/19 1352 10/24/19 0439 10/25/19 0455 10/27/19 0300  AST 28 28 44* 35  ALT 18 18 22 22   ALKPHOS 64 49 46 47  BILITOT 0.9 0.9 1.2 1.2  PROT 7.7 6.5 6.3* 5.7*  ALBUMIN 4.3 3.5 3.2* 2.8*   Recent Labs  Lab 10/23/19 1352 10/24/19 0439 10/25/19 0455  LIPASE >10,000* 3,095* 158*   No results for input(s): AMMONIA in the last 168 hours. Cardiac Enzymes: No results for input(s): CKTOTAL, CKMB, CKMBINDEX, TROPONINI in the last 168 hours. BNP (last 3 results) No results for input(s): BNP in the last 8760 hours.  ProBNP (last 3 results) No results for input(s): PROBNP in the last 8760 hours.  CBG: Recent Labs  Lab 10/27/19 0629  GLUCAP 95   Recent Results (from the past 240 hour(s))  SARS Coronavirus 2 by RT PCR (hospital order, performed in [facility] hospital lab) Nasopharyngeal Nasopharyngeal Swab     Status: None   Collection Time: 10/23/19  5:42 PM   Specimen: Nasopharyngeal Swab  Result Value Ref Range Status     SARS Coronavirus 2 NEGATIVE NEGATIVE Final    Comment: (NOTE) SARS-CoV-2 target nucleic acids are NOT DETECTED.  The SARS-CoV-2 RNA is generally detectable in upper and lower respiratory specimens during the acute phase of infection. The lowest concentration of SARS-CoV-2 viral copies this assay can detect is 250 copies / mL. A negative result does not preclude SARS-CoV-2 infection and should not be used as the sole basis for treatment or other patient management decisions.  A negative result may occur with improper specimen collection / handling, submission of specimen other than nasopharyngeal swab, presence of viral mutation(s) within the areas targeted by this assay, and inadequate number of viral copies (<250 copies / mL). A negative result must be combined with clinical observations, patient history, and epidemiological information.  Fact Sheet for Patients:   StrictlyIdeas.no  Fact Sheet for Healthcare Providers: BankingDealers.co.za  This test is not yet approved or  cleared by the [place] FDA and has been authorized for detection and/or diagnosis of SARS-CoV-2 by FDA under an Emergency Use Authorization (EUA).  This EUA will remain in effect (meaning this test can be used) for the duration of the COVID-19 declaration under Section 564(b)(1) of the Act, 21 U.S.C. section 360bbb-3(b)(1), unless the authorization is terminated or revoked sooner.  Performed at [facility], [place] [address], [place], [place] [address]      Studies: No results found.    Flora Lipps, MD  [facility] 10/27/2019

## 2019-10-27 NOTE — TOC Progression Note (Addendum)
Transition of Care [facility]) - Progression Note    Patient Details  Name: Brianna Reyes MRN: [id number] Date of Birth: [birth year]  Transition of Care [facility]) CM/SW [place], [place] Phone Number: 10/27/2019, 3:57 PM  Clinical Narrative:    CSW met with pt and pt's daughter Brianna Reyes at bedside.  Pt's daughter Brianna Reyes has given verbal consent to send out SNF referrals and would prefer SNF's in [place] and [place] area.  CSW will continue to assist for disposition planning.   Expected Discharge Plan: [place] Barriers to Discharge: Continued Medical Work up  Expected Discharge Plan and Services Expected Discharge Plan: [place] In-house Referral: [facility] Discharge Planning Services: CM Consult Post Acute Care Choice: Home Health, Durable Medical Equipment Living arrangements for the past 2 months: Single Family Home                 DME Arranged: Wheelchair manual DME Agency: [employer] Date DME Agency Contacted: 10/27/19 Time DME Agency Contacted: 1210 Representative spoke with at DME Agency: Thedore Mins HH Arranged: PT, OT, Social Work           Social Determinants of Health ([place]) Interventions    Readmission Risk Interventions No flowsheet data found.

## 2019-10-27 NOTE — Progress Notes (Signed)
Patient placed herself on home CPAP for the night  

## 2019-10-27 NOTE — Progress Notes (Signed)
    Durable Medical Equipment  (From admission, onward)         Start     Ordered   10/27/19 1152  For home use only DME lightweight manual wheelchair with seat cushion  Once       Comments: Patient suffers from functional decline which impairs their ability to perform daily activities like adls in the home.  A walker will not resolve  issue with performing activities of daily living. A wheelchair will allow patient to safely perform daily activities. Patient is not able to propel themselves in the home using a standard weight wheelchair due to weakness. Patient can self propel in the lightweight wheelchair. Length of need - lifetime. Accessories: elevating leg rests (ELRs), wheel locks, extensions and anti-tippers.   10/27/19 1206

## 2019-10-28 DIAGNOSIS — F419 Anxiety disorder, unspecified: Secondary | ICD-10-CM

## 2019-10-28 LAB — CBC
HCT: 34.1 % — ABNORMAL LOW (ref 36.0–46.0)
Hemoglobin: 11 g/dL — ABNORMAL LOW (ref 12.0–15.0)
MCH: 30.5 pg (ref 26.0–34.0)
MCHC: 32.3 g/dL (ref 30.0–36.0)
MCV: 94.5 fL (ref 80.0–100.0)
Platelets: 196 10*3/uL (ref 150–400)
RBC: 3.61 MIL/uL — ABNORMAL LOW (ref 3.87–5.11)
RDW: 14.4 % (ref 11.5–15.5)
WBC: 6.5 10*3/uL (ref 4.0–10.5)
nRBC: 0 % (ref 0.0–0.2)

## 2019-10-28 LAB — BASIC METABOLIC PANEL
Anion gap: 7 (ref 5–15)
BUN: 8 mg/dL (ref 8–23)
CO2: 29 mmol/L (ref 22–32)
Calcium: 8.8 mg/dL — ABNORMAL LOW (ref 8.9–10.3)
Chloride: 103 mmol/L (ref 98–111)
Creatinine, Ser: 0.73 mg/dL (ref 0.44–1.00)
GFR calc Af Amer: >60 mL/min (ref >60)
GFR calc non Af Amer: >60 mL/min (ref >60)
Glucose, Bld: 109 mg/dL — ABNORMAL HIGH (ref 70–99)
Potassium: 3.7 mmol/L (ref 3.5–5.1)
Sodium: 139 mmol/L (ref 135–145)

## 2019-10-28 LAB — MAGNESIUM: Magnesium: 2 mg/dL (ref 1.7–2.4)

## 2019-10-28 MED ORDER — PANTOPRAZOLE SODIUM 40 MG PO TBEC
40.0000 mg | DELAYED_RELEASE_TABLET | Freq: Every day | ORAL | Status: DC
  Administered 2019-10-28 – 2019-10-31 (×4): 40 mg via ORAL
  Filled 2019-10-28 (×4): qty 1

## 2019-10-28 MED ORDER — ACETAMINOPHEN 325 MG PO TABS
650.0000 mg | ORAL_TABLET | ORAL | Status: DC | PRN
  Administered 2019-10-28 – 2019-10-31 (×4): 650 mg via ORAL
  Filled 2019-10-28 (×4): qty 2

## 2019-10-28 NOTE — Progress Notes (Signed)
PROGRESS NOTE  [name] DOB: 28-Nov-1930 DOA: 10/23/2019 PCP: Deland Pretty, MD   LOS: 5 days   Brief narrative: As per HPI,   Brianna Reyes is a 86 y.o. female with medical history significant of HTN, paroxysmal A. fib on flecainide and aspirin, hypothyroidism, umbilical hernia, presented to the hospital with sudden onset of abdominal pain.  Patient reported her son died last week and she has been feeling very stressful and attributed new onset abdominal pain to the new mental stress. In the ED, Lipase significantly high out of reference range.  LFTs, AST 28, ALT 18, bilirubin 0.8.  CT abdomen with contrast showed mild inflammatory changes surrounding the pancreas and duodenum C-loop suggestive of acute pancreatitis with reactive changes of the duodenum. Patient was then admitted to the hospital for further evaluation and treatment.  Assessment/Plan:  Principal Problem:   Acute pancreatitis Active Problems:   Hypothyroidism   Obstructive sleep apnea   Essential hypertension  Acute pancreatitis, likely idiopathic focal pancreatitis CT abdomen with mild inflammatory changes. MRCP without obstructive pathology. Initial lipase of more than 10,000 which has trended down to 3000>158 at this time. Lipid panel showed normal triglyceride.  Patient has tolerated oral diet at this time.   MRCP without any acute findings but peripancreatic edema most notable along the pancreatic tail.   IgG4 was negative.  Mild hypokalemia.  Improved with replacement.  Potassium 3.7 today. Monitor BMP in a.m.  Essential hypertension. Blood pressure is better. On hydralazine, hydrochlorothiazide, lisinopril and metoprolol at home..  Continue to hold off with hydrochlorothiazide.  Restarted lisinopril on 10/26/19.   Volume depletion. Improved. Off IV fluids at this time. On oral diet.  Paroxysmal A. Fib Continue flecainide and metoprolol. Not on systemic anticoagulation for personal reasons,  patient follows up with cardiology as outpatient.  Patient follows up with Dr. Ellouise Newer cardiology as outpatient. Episodes of tachycardia secondary to anxiety.  Anxiety, patient acute distress. Patient was advised to take Ativan as needed. She is trying to not take it  Hypothyroidism Continue Synthroid  Hypophosphatemia. Continue oral phosphorus. Latest phosphate of 2.2. Magnesium 1.7. Continue.  Obstructive sleep apnea. Continue CPAP at nighttime.  DVT prophylaxis: SCDs Start: 10/23/19 1849   Code Status: DNR  Family Communication: Spoke with the patient's daughter Ms. [name] on the phone and updated her about the clinical condition of the patient. She would like her mom to go to a skilled nursing facility.  Status is: Inpatient  Remains inpatient appropriate because:Inpatient level of care appropriate due to severity of illness, debility/ deconditioning likely need skilled nursing facility  Dispo: The patient is from: Home              Anticipated d/c is to: Skilled nursing facility              Anticipated d/c date is: 1-2 days, pending placement              Patient currently is medically stable for disposition.  Consultants:  None  Procedures:  MRCP  Antibiotics:  . None  Anti-infectives (From admission, onward)   None     Subjective:  Today, patient was seen and examined at bedside. Denies any abdominal pain but feels anxious and complains of mild chest discomfort. Denies nausea, vomiting diarrhea.   Objective: Vitals:   10/28/19 0406 10/28/19 0748  BP: (!) 147/67 (!) 152/70  Pulse: 86 89  Resp: 16 16  Temp: 99.7 F (37.6 C) 99 F (37.2 C)  SpO2: 96% 95%    Intake/Output Summary (Last 24 hours) at 10/28/2019 0941 Last data filed at 10/28/2019 0405 Gross per 24 hour  Intake 200 ml  Output 1600 ml  Net -1400 ml   Body mass index is 38.23 kg/m.   Physical Exam: General: Morbidly obese, not in obvious distress,   Alert awake, oriented. Mildly  anxious HENT:   No scleral pallor or icterus noted. Oral mucosa is moist.  Chest:  Clear breath sounds.  Diminished breath sounds bilaterally. No crackles or wheezes.  CVS: S1 &S2 heard. No murmur.  Regular rate and rhythm. Abdomen: Soft, nontender, nondistended.  Bowel sounds are heard.   Extremities: No cyanosis, clubbing or edema.  Peripheral pulses are palpable. Psych: Alert, awake and oriented, normal mood CNS:  No cranial nerve deficits.  Power equal in all extremities. Generalized weakness noted Skin: Warm and dry.  No rashes noted.  Data Review: I have personally reviewed the following laboratory data and studies,  CBC: Recent Labs  Lab 10/23/19 1352 10/25/19 0455 10/26/19 0342 10/27/19 0300 10/28/19 0616  WBC 9.5 10.2 9.5 7.4 6.5  HGB 13.7 12.3 11.8* 11.5* 11.0*  HCT 43.4 39.4 36.8 35.3* 34.1*  MCV 94.8 94.9 93.6 92.7 94.5  PLT 243 207 181 183 709   Basic Metabolic Panel: Recent Labs  Lab 10/24/19 0439 10/25/19 0455 10/26/19 0342 10/27/19 0300 10/28/19 0616  NA 138 136 135 138 139  K 3.4* 3.3* 3.6 3.7 3.7  CL 101 102 102 103 103  CO2 23 24 23 25 29   GLUCOSE 105* 76 87 115* 109*  BUN 19 14 11  7* 8  CREATININE 0.89 0.89 0.73 0.69 0.73  CALCIUM 9.0 8.8* 8.6* 8.6* 8.8*  MG  --  1.8 1.8 1.7 2.0  PHOS  --  1.9* 2.1* 2.2*  --    Liver Function Tests: Recent Labs  Lab 10/23/19 1352 10/24/19 0439 10/25/19 0455 10/27/19 0300  AST 28 28 44* 35  ALT 18 18 22 22   ALKPHOS 64 49 46 47  BILITOT 0.9 0.9 1.2 1.2  PROT 7.7 6.5 6.3* 5.7*  ALBUMIN 4.3 3.5 3.2* 2.8*   Recent Labs  Lab 10/23/19 1352 10/24/19 0439 10/25/19 0455  LIPASE >10,000* 3,095* 158*   No results for input(s): AMMONIA in the last 168 hours. Cardiac Enzymes: No results for input(s): CKTOTAL, CKMB, CKMBINDEX, TROPONINI in the last 168 hours. BNP (last 3 results) No results for input(s): BNP in the last 8760 hours.  ProBNP (last 3 results) No results for input(s): PROBNP in the last 8760  hours.  CBG: Recent Labs  Lab 10/27/19 0629  GLUCAP 95   Recent Results (from the past 240 hour(s))  SARS Coronavirus 2 by RT PCR (hospital order, performed in [facility] hospital lab) Nasopharyngeal Nasopharyngeal Swab     Status: None   Collection Time: 10/23/19  5:42 PM   Specimen: Nasopharyngeal Swab  Result Value Ref Range Status   SARS Coronavirus 2 NEGATIVE NEGATIVE Final    Comment: (NOTE) SARS-CoV-2 target nucleic acids are NOT DETECTED.  The SARS-CoV-2 RNA is generally detectable in upper and lower respiratory specimens during the acute phase of infection. The lowest concentration of SARS-CoV-2 viral copies this assay can detect is 250 copies / mL. A negative result does not preclude SARS-CoV-2 infection and should not be used as the sole basis for treatment or other patient management decisions.  A negative result may occur with improper specimen collection / handling, submission of specimen other than  nasopharyngeal swab, presence of viral mutation(s) within the areas targeted by this assay, and inadequate number of viral copies (<250 copies / mL). A negative result must be combined with clinical observations, patient history, and epidemiological information.  Fact Sheet for Patients:   StrictlyIdeas.no  Fact Sheet for Healthcare Providers: BankingDealers.co.za  This test is not yet approved or  cleared by the [place] FDA and has been authorized for detection and/or diagnosis of SARS-CoV-2 by FDA under an Emergency Use Authorization (EUA).  This EUA will remain in effect (meaning this test can be used) for the duration of the COVID-19 declaration under Section 564(b)(1) of the Act, 21 U.S.C. section 360bbb-3(b)(1), unless the authorization is terminated or revoked sooner.  Performed at [facility], [place] [address] [place], [place] [address]      Studies: No results found.    Flora Lipps,  MD  [facility] 10/28/2019

## 2019-10-28 NOTE — Plan of Care (Signed)
  Problem: Education: Goal: Knowledge of General Education information will improve Description: Including pain rating scale, medication(s)/side effects and non-pharmacologic comfort measures Outcome: Progressing   Problem: Activity: Goal: Risk for activity intolerance will decrease Outcome: Progressing   Problem: Pain Managment: Goal: General experience of comfort will improve Outcome: Progressing   

## 2019-10-28 NOTE — Progress Notes (Signed)
Physical Therapy Treatment Patient Details Name: Brianna Reyes MRN: [id number] DOB: Jul 15, 1930 Today's Date: 10/28/2019    History of Present Illness Brianna Reyes is a 86 y.o. female with medical history significant of HTN, paroxysmal A. fib on flecainide and aspirin, hypothyroidism, umbilical hernia, presented to the hospital with sudden onset of abdominal pain.  acute pancreatitis with reactive changes of the duodenum    PT Comments    Continuing work on functional mobility and activity tolerance;  Very fatigued today, tells me she did not sleep well last night, but still wanting to participate; Performed serial sit<>stands x5 with focus on initiating with forward lean, then pt agreeing to walk, though shorter distance than yesterday;   We discussed dc planning, and pt and daughter are agreeing to SNF for rehab to maximize independence and safety with mobility prior to getting back home (not a candidate for CIR); have updated dc plan  Follow Up Recommendations  SNF     Equipment Recommendations  Rolling walker with 5" wheels;3in1 (PT) (may already have)    Recommendations for Other Services       Precautions / Restrictions Precautions Precautions: Fall    Mobility  Bed Mobility               General bed mobility comments: up in recliner on entry  Transfers Overall transfer level: Needs assistance Equipment used: Rolling walker (2 wheeled) Transfers: Sit to/from Stand Sit to Stand: Min assist         General transfer comment: Min assist for help with forward weight shift to get center of mass over her feet; performed serial sit<>stands; audible crepitus R knee  Ambulation/Gait Ambulation/Gait assistance: Min assist;+2 safety/equipment (chair push) Gait Distance (Feet): 20 Feet Assistive device: Rolling walker (2 wheeled) Gait Pattern/deviations: Decreased step length - right;Decreased step length - left;Decreased stride length;Trunk flexed Gait velocity:  slowed   General Gait Details: Cues to self-monitor for activity tolerance and for RW proximity and posture; notable DOE 2/4; reported feeling weak - no overt knee buckling noted; did not walk as far today; pt attributes to lack of sleep   Stairs             Wheelchair Mobility    Modified Rankin (Stroke Patients Only)       Balance     Sitting balance-Leahy Scale: Fair       Standing balance-Leahy Scale: Poor                              Cognition Arousal/Alertness: Awake/alert Behavior During Therapy: WFL for tasks assessed/performed Overall Cognitive Status: Within Functional Limits for tasks assessed                                        Exercises      General Comments General comments (skin integrity, edema, etc.): Daughter present throughout session      Pertinent Vitals/Pain Pain Assessment: Faces Faces Pain Scale: Hurts a little bit Pain Location: R knee with weight bearing; Audible crepitus with weight bearing knee extension Pain Descriptors / Indicators: Aching Pain Intervention(s): Monitored during session    Home Living                      Prior Function            PT  Goals (current goals can now be found in the care plan section) Acute Rehab PT Goals Patient Stated Goal: agreeable to post-acute rehab; interested in [place] area PT Goal Formulation: With patient/family Time For Goal Achievement: 11/10/19 Potential to Achieve Goals: Good Progress towards PT goals: Progressing toward goals (though limited by fatigue today)    Frequency    Min 2X/week      PT Plan Discharge plan needs to be updated;Frequency needs to be updated    Co-evaluation              AM-PAC PT "6 Clicks" Mobility   Outcome Measure  Help needed turning from your back to your side while in a flat bed without using bedrails?: A Little Help needed moving from lying on your back to sitting on the side of a flat  bed without using bedrails?: A Little Help needed moving to and from a bed to a chair (including a wheelchair)?: A Little Help needed standing up from a chair using your arms (e.g., wheelchair or bedside chair)?: A Little Help needed to walk in hospital room?: A Little Help needed climbing 3-5 steps with a railing? : A Lot 6 Click Score: 17    End of Session Equipment Utilized During Treatment: Gait belt Activity Tolerance: Patient tolerated treatment well (though quite fatigued) Patient left: in chair;with call bell/phone within reach;with chair alarm set Nurse Communication: Mobility status PT Visit Diagnosis: Unsteadiness on feet (R26.81);Other abnormalities of gait and mobility (R26.89);Muscle weakness (generalized) (M62.81)     Time: 4967-5916 PT Time Calculation (min) (ACUTE ONLY): 19 min  Charges:  $Gait Training: 8-22 mins                     Roney Marion, [place]  Acute Rehabilitation Services Pager [phone] Office [place] 10/28/2019, 4:01 PM

## 2019-10-28 NOTE — Progress Notes (Signed)
Patient resting comfortably on home CPAP. No assistance needed from RT at this time. °

## 2019-10-29 LAB — CBC
HCT: 34.8 % — ABNORMAL LOW (ref 36.0–46.0)
Hemoglobin: 11.5 g/dL — ABNORMAL LOW (ref 12.0–15.0)
MCH: 30.7 pg (ref 26.0–34.0)
MCHC: 33 g/dL (ref 30.0–36.0)
MCV: 93 fL (ref 80.0–100.0)
Platelets: 209 10*3/uL (ref 150–400)
RBC: 3.74 MIL/uL — ABNORMAL LOW (ref 3.87–5.11)
RDW: 14.6 % (ref 11.5–15.5)
WBC: 6.5 10*3/uL (ref 4.0–10.5)
nRBC: 0 % (ref 0.0–0.2)

## 2019-10-29 LAB — COMPREHENSIVE METABOLIC PANEL
ALT: 20 U/L (ref 0–44)
AST: 27 U/L (ref 15–41)
Albumin: 2.5 g/dL — ABNORMAL LOW (ref 3.5–5.0)
Alkaline Phosphatase: 43 U/L (ref 38–126)
Anion gap: 7 (ref 5–15)
BUN: 8 mg/dL (ref 8–23)
CO2: 27 mmol/L (ref 22–32)
Calcium: 8.7 mg/dL — ABNORMAL LOW (ref 8.9–10.3)
Chloride: 104 mmol/L (ref 98–111)
Creatinine, Ser: 0.66 mg/dL (ref 0.44–1.00)
GFR calc Af Amer: >60 mL/min (ref >60)
GFR calc non Af Amer: >60 mL/min (ref >60)
Glucose, Bld: 96 mg/dL (ref 70–99)
Potassium: 3.7 mmol/L (ref 3.5–5.1)
Sodium: 138 mmol/L (ref 135–145)
Total Bilirubin: 1.1 mg/dL (ref 0.3–1.2)
Total Protein: 5.4 g/dL — ABNORMAL LOW (ref 6.5–8.1)

## 2019-10-29 LAB — PHOSPHORUS: Phosphorus: 3.4 mg/dL (ref 2.5–4.6)

## 2019-10-29 LAB — MAGNESIUM: Magnesium: 1.8 mg/dL (ref 1.7–2.4)

## 2019-10-29 MED ORDER — ENOXAPARIN SODIUM 40 MG/0.4ML ~~LOC~~ SOLN
40.0000 mg | SUBCUTANEOUS | Status: DC
  Administered 2019-10-29 – 2019-10-30 (×2): 40 mg via SUBCUTANEOUS
  Filled 2019-10-29 (×3): qty 0.4

## 2019-10-29 NOTE — Progress Notes (Signed)
PROGRESS NOTE  ROME SCHLAUCH QQI:[id number] DOB: May 08, 1930 DOA: 10/23/2019 PCP: Deland Pretty, MD   LOS: 6 days   Brief narrative: As per HPI,   Brianna Reyes is a 86 y.o. female with medical history significant of HTN, paroxysmal A. fib on flecainide and aspirin, hypothyroidism, umbilical hernia, presented to the hospital with sudden onset of abdominal pain.  Patient reported her son died last week and she has been feeling very stressful and attributed new onset abdominal pain to the new mental stress. In the ED, Lipase significantly high out of reference range.  LFTs, AST 28, ALT 18, bilirubin 0.8.  CT abdomen with contrast showed mild inflammatory changes surrounding the pancreas and duodenum C-loop suggestive of acute pancreatitis with reactive changes of the duodenum. Patient was then admitted to the hospital for further evaluation and treatment.  Assessment/Plan:  Acute pancreatitis, likely idiopathic focal pancreatitis CT abdomen with mild inflammatory changes. MRCP without obstructive pathology. Initial lipase of more than 10,000 which has trended down to 3000>158 at this time. Lipid panel showed normal triglyceride.  -Treated with supportive care, bowel rest and IV fluids -Clinically improved -MRCP without acute findings, peripancreatic edema noted along the pancreatic tail -IgG4 was negative -Discharge planning, recommend outpatient follow-up with gastroenterology in 1 to 2 months -SNF recommended by PT   Mild hypokalemia. -Replaced  Essential hypertension. Stable - on hydralazine, hydrochlorothiazide, lisinopril and metoprolol at home..  Continue to hold off with hydrochlorothiazide.  Restarted lisinopril on 10/26/19.   Volume depletion. Improved. Off IV fluids at this time. On oral diet.  Paroxysmal A. Fib Continue flecainide and metoprolol. Not on systemic anticoagulation for personal reasons, patient follows up with cardiology as outpatient.  Patient follows up with Dr.  Ellouise Newer cardiology as outpatient.  -Rate controlled  Anxiety -Improved on as needed Ativan, discouraged long-term use given advanced age  Hypothyroidism Continue Synthroid  Hypophosphatemia.  -Replaced  Obstructive sleep apnea. Continue CPAP at nighttime.  DVT prophylaxis: start lovenox   Code Status: DNR  Family Communication: Spoke with the patient's daughter [name] at bedside Status is: Inpatient Remains inpatient appropriate because:Inpatient level of care appropriate due to severity of illness, debility/ deconditioning likely need skilled nursing facility  Dispo: The patient is from: Home              Anticipated d/c is to: Skilled nursing facility              Anticipated d/c date is: When SNF bed is available              Patient currently is medically stable for discharge  Consultants:  None  Procedures:  MRCP  Antibiotics:   None  Anti-infectives (From admission, onward)   None     Subjective:  -Feels well overall, denies any abdominal pain nausea vomiting  Objective: Vitals:   10/29/19 0549 10/29/19 0935  BP: (!) 150/76 (!) 142/80  Pulse: 98 87  Resp: 15 18  Temp: 98.4 F (36.9 C) 98.3 F (36.8 C)  SpO2: 95% 93%    Intake/Output Summary (Last 24 hours) at 10/29/2019 1455 Last data filed at 10/29/2019 1300 Gross per 24 hour  Intake 800 ml  Output 650 ml  Net 150 ml   Body mass index is 38.23 kg/m.   Physical Exam: General: Morbidly obese pleasant female sitting up in bed, AAOx3, no distress HEENT: No pallor or icterus CVS: S1-S2, irregularly irregular Lungs: Diminished breath sounds the bases, otherwise clear Abdomen: Soft, nontender, nondistended, obese,  bowel sounds present Extremities: No edema Psych: Normal mood and affect  Data Review: I have personally reviewed the following laboratory data and studies,  CBC: Recent Labs  Lab 10/25/19 0455 10/26/19 0342 10/27/19 0300 10/28/19 0616 10/29/19 0652  WBC 10.2 9.5 7.4  6.5 6.5  HGB 12.3 11.8* 11.5* 11.0* 11.5*  HCT 39.4 36.8 35.3* 34.1* 34.8*  MCV 94.9 93.6 92.7 94.5 93.0  PLT 207 181 183 196 527   Basic Metabolic Panel: Recent Labs  Lab 10/25/19 0455 10/26/19 0342 10/27/19 0300 10/28/19 0616 10/29/19 0652  NA 136 135 138 139 138  K 3.3* 3.6 3.7 3.7 3.7  CL 102 102 103 103 104  CO2 24 23 25 29 27   GLUCOSE 76 87 115* 109* 96  BUN 14 11 7* 8 8  CREATININE 0.89 0.73 0.69 0.73 0.66  CALCIUM 8.8* 8.6* 8.6* 8.8* 8.7*  MG 1.8 1.8 1.7 2.0 1.8  PHOS 1.9* 2.1* 2.2*  --  3.4   Liver Function Tests: Recent Labs  Lab 10/23/19 1352 10/24/19 0439 10/25/19 0455 10/27/19 0300 10/29/19 0652  AST 28 28 44* 35 27  ALT 18 18 22 22 20   ALKPHOS 64 49 46 47 43  BILITOT 0.9 0.9 1.2 1.2 1.1  PROT 7.7 6.5 6.3* 5.7* 5.4*  ALBUMIN 4.3 3.5 3.2* 2.8* 2.5*   Recent Labs  Lab 10/23/19 1352 10/24/19 0439 10/25/19 0455  LIPASE >10,000* 3,095* 158*   No results for input(s): AMMONIA in the last 168 hours. Cardiac Enzymes: No results for input(s): CKTOTAL, CKMB, CKMBINDEX, TROPONINI in the last 168 hours. BNP (last 3 results) No results for input(s): BNP in the last 8760 hours.  ProBNP (last 3 results) No results for input(s): PROBNP in the last 8760 hours.  CBG: Recent Labs  Lab 10/27/19 0629  GLUCAP 95   Recent Results (from the past 240 hour(s))  SARS Coronavirus 2 by RT PCR (hospital order, performed in [facility] hospital lab) Nasopharyngeal Nasopharyngeal Swab     Status: None   Collection Time: 10/23/19  5:42 PM   Specimen: Nasopharyngeal Swab  Result Value Ref Range Status   SARS Coronavirus 2 NEGATIVE NEGATIVE Final    Comment: (NOTE) SARS-CoV-2 target nucleic acids are NOT DETECTED.  The SARS-CoV-2 RNA is generally detectable in upper and lower respiratory specimens during the acute phase of infection. The lowest concentration of SARS-CoV-2 viral copies this assay can detect is 250 copies / mL. A negative result does not preclude  SARS-CoV-2 infection and should not be used as the sole basis for treatment or other patient management decisions.  A negative result may occur with improper specimen collection / handling, submission of specimen other than nasopharyngeal swab, presence of viral mutation(s) within the areas targeted by this assay, and inadequate number of viral copies (<250 copies / mL). A negative result must be combined with clinical observations, patient history, and epidemiological information.  Fact Sheet for Patients:   StrictlyIdeas.no  Fact Sheet for Healthcare Providers: BankingDealers.co.za  This test is not yet approved or  cleared by the [place] FDA and has been authorized for detection and/or diagnosis of SARS-CoV-2 by FDA under an Emergency Use Authorization (EUA).  This EUA will remain in effect (meaning this test can be used) for the duration of the COVID-19 declaration under Section 564(b)(1) of the Act, 21 U.S.C. section 360bbb-3(b)(1), unless the authorization is terminated or revoked sooner.  Performed at [facility], [place] [address] [place], [place] [address]  Studies: No results found. Domenic Polite, MD  [facility] 10/29/2019

## 2019-10-29 NOTE — Progress Notes (Signed)
OT Cancellation Note  Patient Details Name: Brianna Reyes MRN: [id number] DOB: 1930-05-08   Cancelled Treatment:    Reason Eval/Treat Not Completed: Patient declined, no reason specified. Pt reporting just gotten to bed with CPAP on. Pt reports her visitors just left and she would prefer to rest at this time. Will re-attempt OT session tomorrow.   Layla Maw 10/29/2019, 2:41 PM

## 2019-10-29 NOTE — TOC Progression Note (Signed)
Transition of Care [facility]) - Progression Note    Patient Details  Name: Brianna Reyes MRN: [id number] Date of Birth: May 07, 1930  [facility] CM/SW Contact  Sharlet [place]ina Mila Homer, LCSW Phone Number: 10/29/2019, 5:20 PM  Clinical Narrative: CSW talked with Orbie Hurst, admissions director at [facility] in [place] [phone]) regarding patient and was advised that she has not heard from the nursing director regarding patient. Received another call from [facility] asking if patient has been vaccinated. Contacted daughter (patient was asleep) and was advised that patient had Wynetta Emery and [place] 1 dose vaccination about 6 months ago.Return call made to [facility] and message left regarding vaccination.   CSW will follow-up with admissions director at [facility] in [place] Thursday, 819.     Expected Discharge Plan: [place] Barriers to Discharge: Continued Medical Work up  Expected Discharge Plan and Services Expected Discharge Plan: [place] In-house Referral: [facility] Discharge Planning Services: CM Consult Post Acute Care Choice: Home Health, Durable Medical Equipment Living arrangements for the past 2 months: Single Family Home                 DME Arranged: Wheelchair manual DME Agency: [employer] Date DME Agency Contacted: 10/27/19 Time DME Agency Contacted: 1210 Representative spoke with at DME Agency: Thedore Mins HH Arranged: PT, OT, Social Work           Social Determinants of Health ([place]) Interventions    Readmission Risk Interventions No flowsheet data found.

## 2019-10-29 NOTE — Plan of Care (Signed)
°  Problem: Coping: °Goal: Level of anxiety will decrease °Outcome: Progressing °  °

## 2019-10-29 NOTE — Plan of Care (Signed)
  Problem: Education: Goal: Knowledge of General Education information will improve Description: Including pain rating scale, medication(s)/side effects and non-pharmacologic comfort measures Outcome: Progressing   Problem: Activity: Goal: Risk for activity intolerance will decrease Outcome: Progressing   

## 2019-10-29 NOTE — TOC Progression Note (Signed)
Transition of Care [facility]) - Progression Note    Patient Details  Name: Brianna Reyes MRN: [id number] Date of Birth: 1930-09-18  Transition of Care [facility]) CM/SW Contact  Sharlet Salina Mila Homer, LCSW Phone Number: 10/29/2019, 4:01 PM  Clinical Narrative:   Talked with daughter at the bedside on 10/28/19 regarding SNF placement. Patient was in bed, napping and on BiPap//C-Pap. Daughter was provided with her [facility] bed offers and indicated that her preferences would be (1) [facility] and (2) [facility]. [facility] was contacted and CSW informed that no beds available today.  Talked with daughter regarding non-emergency ambulance transport and [facility] called while in room and the estimated cost will be over $2,000. Daughter informed regarding cost and payment up-front, and indicated that this would not be a problem.  CSW left room and prior to returning was provided with information by patient's nurse requesting facility in [place], [place] be contacted.   Call made to [facility] and talked with Orbie Hurst in admissions [phone]) regarding patient. She requested clinicals and they were faxed to facility (fax # [phone]).  8/17: 12:52 pm: Call made to [facility] and they do manage patient.  10/29/19: 3:40 pm: Talked with Orbie Hurst, admissions director at [facility] and [facility] is reviewing clinicals. She asked if patient had been vaccinated and advised Maxine that I would ask patient or daughter and get back with her. 3:48 pm: Called daughter and was advised that her mother has been vaccinated [employer]) and the card is in her wallet.  3:58 pm: Contacted Maxine at [facility] and message left. Awaiting call back from [place].  Expected Discharge Plan: [place] Barriers to Discharge: Continued Medical Work up  Expected Discharge Plan and Services Expected Discharge Plan: [place] In-house Referral: [facility] Discharge Planning Services: CM  Consult Post Acute Care Choice: Home Health, Durable Medical Equipment Living arrangements for the past 2 months: Single Family Home                 DME Arranged: Wheelchair manual DME Agency: [facility] Date DME Agency Contacted: 10/27/19 Time DME Agency Contacted: 1210 Representative spoke with at DME Agency: Thedore Mins HH Arranged: PT, OT, Social Work           Social Determinants of Health ([place]) Interventions    Readmission Risk Interventions No flowsheet data found.

## 2019-10-30 NOTE — TOC Transition Note (Deleted)
Transition of Care [facility]) - CM/SW Discharge Note   Patient Details  Name: Brianna Reyes MRN: [id number] Date of Birth: Feb 02, 1931  Transition of Care [facility]) CM/SW Contact:  Sable Feil, LCSW Phone Number: 10/30/2019, 4:37 PM   Clinical Narrative: Talked with Orbie Hurst at [facility] in [place] and they can take patient, pending insurance authorization. Maxine advised that clinicals has been faxed to [place] with SNF pending. Maxine informed that MD has done the d/c summary and she requested that it be sent to her. [facility] contacted and provided with name of skilled nursing facility. Daughter contacted and updated.   Barriers to Discharge: Continued Medical Work up   Patient Goals and CMS Choice Patient states their goals for this hospitalization and ongoing recovery are:: return home with family support CMS Medicare.gov Compare Post Acute Care list provided to:: Patient Choice offered to / list presented to : Patient, Adult Children  Discharge Placement - SNF: [place], [place], [place]                       Discharge Plan and Services In-house Referral: [facility] Discharge Planning Services: CM Consult Post Acute Care Choice: Home Health, Durable Medical Equipment          DME Arranged: Wheelchair manual DME Agency: [facility] Date DME Agency Contacted: 10/27/19 Time DME Agency Contacted: [id number] Representative spoke with at DME Agency: Thedore Mins HH Arranged: PT, OT, Social Work          Social Determinants of Health ([place]) Interventions  No SDOH interventions requested or needed at this time   Readmission Risk Interventions No flowsheet data found.

## 2019-10-30 NOTE — TOC Progression Note (Addendum)
Transition of Care [facility]) - Progression Note    Patient Details  Name: Brianna Reyes MRN: [id number] Date of Birth: 01/13/1931  Transition of Care [facility]) CM/SW Contact  Brianna Salina Mila Homer, LCSW Phone Number: 10/30/2019, 5:05 PM  Clinical Narrative:  Talked with Brianna Reyes at [facility] in [place] [phone]) and they can take patient, pending insurance authorization. Brianna Reyes advised that clinicals has been faxed to [place] with SNF pending. Brianna Reyes informed that MD has done the d/c summary and she requested that it be sent to her. [facility] contacted and provided with name of skilled nursing facility. Daughter Brianna Reyes [phone]) contacted and updated.   CSW will continue to follow and facilitate discharge to [facility] once [employer] authorization received.   Expected Discharge Plan: [place] SNF Barriers to Discharge: Continued Medical Work up  Expected Discharge Plan and Services Expected Discharge Plan: [place] In-house Referral: [facility] Discharge Planning Services: CM Consult Post Acute Care Choice: Home Health, Durable Medical Equipment Living arrangements for the past 2 months: Single Family Home Expected Discharge Date: 10/30/19               DME Arranged: Wheelchair manual DME Agency: [facility] Date DME Agency Contacted: 10/27/19 Time DME Agency Contacted: 1210 Representative spoke with at DME Agency: Brianna Reyes HH Arranged: PT, OT, Social Work         Social Determinants of Health ([place]) Interventions  No SDOH interventions requested or needed at this time.  Readmission Risk Interventions No flowsheet data found.

## 2019-10-30 NOTE — Progress Notes (Signed)
Occupational Therapy Treatment Patient Details Name: Brianna Reyes MRN: [id number] DOB: May 09, 1930 Today's Date: 10/30/2019    History of present illness Brianna Reyes is a 86 y.o. female with medical history significant of HTN, paroxysmal A. fib on flecainide and aspirin, hypothyroidism, umbilical hernia, presented to the hospital with sudden onset of abdominal pain.  acute pancreatitis with reactive changes of the duodenum   OT comments  Pt progressing well with OT goals. Pt initially required min guard for transfers and mobility in room with RW today, progressing to Supervision by end of session. Pt attentive to safety cues and demonstrated ability to complete various grooming tasks after setup standing at sink for 10 minutes. Pt reported R LE discomfort and stiffness that improved after mobilizing. Pt reports plan to go to SNF in [place] near family for rehab. Pt motivated to return to independence.   Follow Up Recommendations  SNF    Equipment Recommendations  3 in 1 bedside commode    Recommendations for Other Services      Precautions / Restrictions Precautions Precautions: Fall Restrictions Weight Bearing Restrictions: No       Mobility Bed Mobility               General bed mobility comments: up in recliner on entry  Transfers Overall transfer level: Needs assistance Equipment used: Rolling walker (2 wheeled) Transfers: Sit to/from [facility] Sit to Stand: Min guard Stand pivot transfers: Min guard       General transfer comment: min guard initially for steadying and increased effort due to R LE stiffness/discomfort. Pt progressed to supervision for mobility/transfers by end of session    Balance Overall balance assessment: Needs assistance;History of Falls Sitting-balance support: No upper extremity supported;Feet supported Sitting balance-Leahy Scale: Good     Standing balance support: No upper extremity supported;During functional  activity Standing balance-Leahy Scale: Fair Standing balance comment: able to complete grooming tasks unsupported at sink, use of RW for mobility                            ADL either performed or assessed with clinical judgement   ADL Overall ADL's : Needs assistance/impaired     Grooming: Set up;Standing;Wash/dry hands;Wash/dry face Grooming Details (indicate cue type and reason): Pt progresed to setup for brushing hair, oral care and applying makeup standing at sink with RW. Pt stood for 10 minutes to complete tasks, no LOB                 Toilet Transfer: Min Geophysical data processor Details (indicate cue type and reason): min guard for initial SPT to [facility] due to urinary urgency Toileting- Clothing Manipulation and Hygiene: Min guard;Sit to/from stand Toileting - Clothing Manipulation Details (indicate cue type and reason): min guard for peri care and clothing mgmt progressing to supervision     Functional mobility during ADLs: Min guard;Rolling walker General ADL Comments: Pt improving endurance and stability today, attentive to safety cues and following commands/staying on task well.      Vision   Vision Assessment?: No apparent visual deficits   Perception     Praxis      Cognition Arousal/Alertness: Awake/alert Behavior During Therapy: WFL for tasks assessed/performed Overall Cognitive Status: Within Functional Limits for tasks assessed  General Comments: A&Ox4, attentive to safety cues        Exercises     Shoulder Instructions       General Comments "I think moving around helped my right leg."    Pertinent Vitals/ Pain       Pain Assessment: Faces Faces Pain Scale: Hurts a little bit Pain Location: R thigh/knee at start of mobility Pain Descriptors / Indicators: Sore Pain Intervention(s): Monitored during session  Home Living                                           Prior Functioning/Environment              Frequency  Min 2X/week        Progress Toward Goals  OT Goals(current goals can now be found in the care plan section)  Progress towards OT goals: Progressing toward goals  Acute Rehab OT Goals Patient Stated Goal: agreeable to post-acute rehab; interested in [place] area OT Goal Formulation: With patient/family Time For Goal Achievement: 11/10/19 Potential to Achieve Goals: Good ADL Goals Pt Will Perform Grooming: with modified independence;standing Pt Will Perform Lower Body Bathing: with modified independence;sit to/from stand Pt Will Perform Lower Body Dressing: with modified independence;sit to/from stand Pt Will Transfer to Toilet: with modified independence;ambulating;bedside commode Pt Will Perform Toileting - Clothing Manipulation and hygiene: with modified independence;sit to/from stand Additional ADL Goal #1: Pt to verbalize at least 3 fall prevention strategies  Plan Discharge plan remains appropriate    Co-evaluation                 AM-PAC OT "6 Clicks" Daily Activity     Outcome Measure   Help from another person eating meals?: None Help from another person taking care of personal grooming?: A Little Help from another person toileting, which includes using toliet, bedpan, or urinal?: A Little Help from another person bathing (including washing, rinsing, drying)?: A Little Help from another person to put on and taking off regular upper body clothing?: A Little Help from another person to put on and taking off regular lower body clothing?: A Lot 6 Click Score: 18    End of Session Equipment Utilized During Treatment: Rolling walker  OT Visit Diagnosis: Unsteadiness on feet (R26.81);Other abnormalities of gait and mobility (R26.89);Muscle weakness (generalized) (M62.81);History of falling (Z91.81)   Activity Tolerance Patient tolerated treatment well   Patient Left in chair;with call  bell/phone within reach   Nurse Communication Mobility status        Time: 2694-8546 OT Time Calculation (min): 28 min  Charges: OT General Charges $OT Visit: 1 Visit OT Treatments $Self Care/Home Management : 23-37 mins  Layla Maw, OTR/L   Layla Maw 10/30/2019, 10:32 AM

## 2019-10-30 NOTE — Progress Notes (Signed)
Pt placed self on home cpap machine. 

## 2019-10-30 NOTE — Progress Notes (Deleted)
Physician Discharge Summary  Brianna Reyes RJJ:[id number] DOB: 09-06-1930 DOA: 10/23/2019  PCP: Deland Pretty, MD  Admit date: 10/23/2019 Discharge date: 10/30/2019  Time spent: 73minutes  Recommendations for Outpatient Follow-up:  FU with GI in 1 month   Discharge Diagnoses:  Principal Problem:   Acute pancreatitis   Paroxysmal AFib-not on antocoagulation   Hypothyroidism   Obstructive sleep apnea   Essential hypertension   DNR   Chronic R leg weakness  Discharge Condition: stable  Diet recommendation: heart healthy  Filed Weights   10/25/19 2014 10/27/19 2028 10/28/19 2051  Weight: 92.8 kg 94.8 kg 94.8 kg    History of present illness:  Brianna Reyes a 86 y.o.femalewith medical history significant ofHTN,paroxysmal A. fib on flecainide and aspirin, hypothyroidism, umbilical hernia, presented to the hospital with sudden onset of abdominal pain. Patient reported her son died last week and she has been feeling very stressful and attributed new onset abdominal pain to the new mentalstress. In the ED,Lipase significantly high out of reference range.LFTs, AST 28, ALT 18, bilirubin 0.8.CT abdomen with contrast showed mild inflammatory changes surrounding the pancreas and duodenum C-loop suggestive of acute pancreatitis with reactive changes of the duodenum. Patient was then admitted to the hospital for further evaluation and treatment.  Hospital Course:   Acute pancreatitis, likely idiopathic focal pancreatitis CT abdomen with mild inflammatory changes. MRCP without obstructive pathology. Initial lipase of more than 10,000 which has trended down to 3000>158 at this time. Lipid panel showed normal triglyceride.  -Treated with supportive care, bowel rest and IV fluids -Clinically improved, tolerating diet -MRCP without acute findings, peripancreatic edema noted along the pancreatic tail -IgG4 was negative -Discharge to [facility],  outpatient follow-up with gastroenterology  in 1 to 2 months  Mild hypokalemia. -Replaced  Essential hypertension. Stable - on hydralazine, hydrochlorothiazide, lisinopril and metoprolol at home.Marland Kitchen    Restarted lisinopril on 10/26/19.   Volume depletion. Improved. Off IV fluids at this time. On oral diet.  Paroxysmal A. Fib Continue flecainide and metoprolol. Not on systemic anticoagulationfor personal reasons, patient follows up with cardiology Dr. Ellouise Newer cardiology as outpatient.  -Rate controlled  Anxiety -Improved on as needed Ativan, discouraged long-term use given advanced age  Hypothyroidism Continue Synthroid  Hypophosphatemia.  -Replaced  Obstructive sleep apnea. Continue CPAP at nighttime.  CHronic R leg weakness -pt reports that her R leg has been weak for 6-12 months, recommend Neurology FU for this  Code Status: DNR   Discharge Exam: Vitals:   10/30/19 0444 10/30/19 0910  BP: (!) 139/59 (!) 145/51  Pulse: 65 61  Resp: 17 18  Temp: 98.7 F (37.1 C) 98.2 F (36.8 C)  SpO2: 99% 96%    General: AAOx3 Cardiovascular: S1S2/RRR Respiratory: CTAB  Discharge Instructions    Allergies as of 10/30/2019      Reactions   Other Other (See Comments)   CORTISONE INJECTION = Severe dizziness and made her very sick; pt states: "it about killed me"      Medication List    TAKE these medications   ALIVE ENERGY 50+ PO Take by mouth.   Co Q10 200 MG Caps Take 1 capsule by mouth daily.   CRANBERRY PO Take 1 capsule by mouth daily.   flecainide 50 MG tablet Commonly known as: TAMBOCOR TAKE 1 TABLET BY MOUTH TWICE A DAY   hydrALAZINE 25 MG tablet Commonly known as: APRESOLINE TAKE 1 TABLET BY MOUTH THREE TIMES A DAY   levothyroxine 100 MCG tablet Commonly known  as: SYNTHROID Take 100 mcg by mouth daily before breakfast.   lisinopril-hydrochlorothiazide 20-12.5 MG tablet Commonly known as: ZESTORETIC TAKE 1 TABLET BY MOUTH EVERY DAY   LUTEIN-ZEAXANTHIN PO Take 1 tablet by  mouth daily.   metoprolol succinate 50 MG 24 hr tablet Commonly known as: TOPROL-XL Take 50 mg by mouth daily.   multivitamin with minerals Tabs tablet Take 1 tablet by mouth daily. Contains vitamin C   nitroGLYCERIN 0.4 MG SL tablet Commonly known as: NITROSTAT Place 1 tablet (0.4 mg total) under the tongue every 5 (five) minutes as needed for chest pain.   PRESCRIPTION MEDICATION See admin instructions. CPAP-            Durable Medical Equipment  (From admission, onward)         Start     Ordered   10/27/19 1152  For home use only DME lightweight manual wheelchair with seat cushion  Once       Comments: Patient suffers from functional decline which impairs their ability to perform daily activities like adls in the home.  A walker will not resolve  issue with performing activities of daily living. A wheelchair will allow patient to safely perform daily activities. Patient is not able to propel themselves in the home using a standard weight wheelchair due to weakness. Patient can self propel in the lightweight wheelchair. Length of need - lifetime. Accessories: elevating leg rests (ELRs), wheel locks, extensions and anti-tippers.   10/27/19 1206         Allergies  Allergen Reactions  . Other Other (See Comments)    CORTISONE INJECTION = Severe dizziness and made her very sick; pt states: "it about killed me"      The results of significant diagnostics from this hospitalization (including imaging, microbiology, ancillary and laboratory) are listed below for reference.    Significant Diagnostic Studies: DG Chest 2 View  Result Date: 10/23/2019 CLINICAL DATA:  Chest pain EXAM: CHEST - 2 VIEW COMPARISON:  04/05/2014 FINDINGS: Cardiomegaly, mild vascular congestion. Bibasilar atelectasis. No effusions or overt edema. No acute bony abnormality. IMPRESSION: Cardiomegaly, vascular congestion and bibasilar atelectasis. Electronically Signed   By: Rolm Baptise M.D.   On:  10/23/2019 16:07   CT Abdomen Pelvis W Contrast  Result Date: 10/23/2019 CLINICAL DATA:  86 year old female with abdominal pain. EXAM: CT ABDOMEN AND PELVIS WITH CONTRAST TECHNIQUE: Multidetector CT imaging of the abdomen and pelvis was performed using the standard protocol following bolus administration of intravenous contrast. CONTRAST:  158mL OMNIPAQUE IOHEXOL 300 MG/ML  SOLN COMPARISON:  None. FINDINGS: Lower chest: There are bibasilar linear atelectasis/scarring. There is coronary vascular calcification. No intra-abdominal free air or free fluid. Hepatobiliary: No focal liver abnormality is seen. No gallstones, gallbladder wall thickening, or biliary dilatation. Pancreas: Mild haziness adjacent to the pancreas. Correlation with pancreatic enzymes recommended to evaluate for acute pancreatitis. No drainable fluid collection. Spleen: Normal in size without focal abnormality. Adrenals/Urinary Tract: The adrenal glands unremarkable. There is no hydronephrosis on either side. There is symmetric enhancement and excretion of contrast by both kidneys. There are bilateral renal cysts measure up to 5 cm in the inferior pole of the right kidney. The visualized ureters and urinary bladder appear unremarkable. Stomach/Bowel: There is mild stranding adjacent to the duodenal C-loop which may be reactive to inflammatory changes of the pancreas or represent enteritis. There are small scattered sigmoid diverticula without active inflammatory changes. There is no bowel obstruction. Appendectomy. Vascular/Lymphatic: There is advanced aortoiliac atherosclerotic disease. The IVC  is unremarkable. No portal venous gas. There is no adenopathy. Reproductive: The uterus and ovaries are grossly unremarkable. Other: There is a small fat containing umbilical hernia. There is slight stranding of the herniated fat. No fluid collection. Musculoskeletal: Osteopenia with degenerative changes of the spine. No acute osseous pathology.  IMPRESSION: 1. Mild inflammatory changes surrounding the pancreas and duodenal C-loop. Findings favored to represent acute pancreatitis with reactive changes of the duodenum and less likely duodenitis. Clinical correlation is recommended. 2. Small scattered sigmoid diverticula. No bowel obstruction. 3. Aortic Atherosclerosis (ICD10-I70.0). Electronically Signed   By: Anner Crete M.D.   On: 10/23/2019 17:13   MR ABDOMEN MRCP WO CONTRAST  Result Date: 10/24/2019 CLINICAL DATA:  Elevated lipase levels, clinical suspicion for pancreatitis. EXAM: MRI ABDOMEN WITHOUT CONTRAST  (INCLUDING MRCP) TECHNIQUE: Multiplanar multisequence MR imaging of the abdomen was performed. Heavily T2-weighted images of the biliary and pancreatic ducts were obtained, and three-dimensional MRCP images were rendered by post processing. COMPARISON:  CT abdomen 10/23/2019 FINDINGS: Despite efforts by the technologist and patient, motion artifact is present on today's exam and could not be eliminated. This reduces exam sensitivity and specificity. Lower chest: Increasing dependent atelectasis in both lower lobes. Mild cardiomegaly. Trace right pleural effusion, new compared to yesterday. Hepatobiliary: Unremarkable.  No biliary dilatation. Pancreas: Peripancreatic edema, most notable along the pancreatic tail, probably a manifestation of pancreatitis. No dorsal pancreatic duct dilatation. Normal pancreatic duct branching pattern. Spleen:  Unremarkable Adrenals/Urinary Tract: Bosniak category 2 cyst of the right kidney lower pole. Additional benign bilateral renal cysts. Adrenal glands unremarkable. Stomach/Bowel: Unremarkable Vascular/Lymphatic: Aortoiliac atherosclerotic vascular disease. No pathologic adenopathy identified. Other:  Trace perihepatic and perisplenic ascites. Musculoskeletal: Lumbar spondylosis and degenerative disc disease. Mild dextroconvex lower thoracic scoliosis and mild levoconvex lumbar scoliosis with rotary  component. IMPRESSION: 1. Peripancreatic edema, most notable along the pancreatic tail, probably a manifestation of pancreatitis. No dorsal pancreatic duct dilatation. No appreciable abscess or pseudocyst. 2. No biliary dilatation. 3. Trace perihepatic and perisplenic ascites. 4. Increasing dependent atelectasis in both lower lobes. Trace right pleural effusion. 5. Lumbar spondylosis and degenerative disc disease. 6.  Aortic Atherosclerosis (ICD10-I70.0). Electronically Signed   By: Van Clines M.D.   On: 10/24/2019 15:37    Microbiology: Recent Results (from the past 240 hour(s))  SARS Coronavirus 2 by RT PCR (hospital order, performed in [facility] hospital lab) Nasopharyngeal Nasopharyngeal Swab     Status: None   Collection Time: 10/23/19  5:42 PM   Specimen: Nasopharyngeal Swab  Result Value Ref Range Status   SARS Coronavirus 2 NEGATIVE NEGATIVE Final    Comment: (NOTE) SARS-CoV-2 target nucleic acids are NOT DETECTED.  The SARS-CoV-2 RNA is generally detectable in upper and lower respiratory specimens during the acute phase of infection. The lowest concentration of SARS-CoV-2 viral copies this assay can detect is 250 copies / mL. A negative result does not preclude SARS-CoV-2 infection and should not be used as the sole basis for treatment or other patient management decisions.  A negative result may occur with improper specimen collection / handling, submission of specimen other than nasopharyngeal swab, presence of viral mutation(s) within the areas targeted by this assay, and inadequate number of viral copies (<250 copies / mL). A negative result must be combined with clinical observations, patient history, and epidemiological information.  Fact Sheet for Patients:   StrictlyIdeas.no  Fact Sheet for Healthcare Providers: BankingDealers.co.za  This test is not yet approved or  cleared by the [place] FDA and  has been  authorized for detection and/or diagnosis of SARS-CoV-2 by FDA under an Emergency Use Authorization (EUA).  This EUA will remain in effect (meaning this test can be used) for the duration of the COVID-19 declaration under Section 564(b)(1) of the Act, 21 U.S.C. section 360bbb-3(b)(1), unless the authorization is terminated or revoked sooner.  Performed at [facility], [place] [address] [place], [place] [address]      Labs: Basic Metabolic Panel: Recent Labs  Lab 10/25/19 0455 10/26/19 0342 10/27/19 0300 10/28/19 0616 10/29/19 0652  NA 136 135 138 139 138  K 3.3* 3.6 3.7 3.7 3.7  CL 102 102 103 103 104  CO2 24 23 25 29 27   GLUCOSE 76 87 115* 109* 96  BUN 14 11 7* 8 8  CREATININE 0.89 0.73 0.69 0.73 0.66  CALCIUM 8.8* 8.6* 8.6* 8.8* 8.7*  MG 1.8 1.8 1.7 2.0 1.8  PHOS 1.9* 2.1* 2.2*  --  3.4   Liver Function Tests: Recent Labs  Lab 10/23/19 1352 10/24/19 0439 10/25/19 0455 10/27/19 0300 10/29/19 0652  AST 28 28 44* 35 27  ALT 18 18 22 22 20   ALKPHOS 64 49 46 47 43  BILITOT 0.9 0.9 1.2 1.2 1.1  PROT 7.7 6.5 6.3* 5.7* 5.4*  ALBUMIN 4.3 3.5 3.2* 2.8* 2.5*   Recent Labs  Lab 10/23/19 1352 10/24/19 0439 10/25/19 0455  LIPASE >10,000* 3,095* 158*   No results for input(s): AMMONIA in the last 168 hours. CBC: Recent Labs  Lab 10/25/19 0455 10/26/19 0342 10/27/19 0300 10/28/19 0616 10/29/19 0652  WBC 10.2 9.5 7.4 6.5 6.5  HGB 12.3 11.8* 11.5* 11.0* 11.5*  HCT 39.4 36.8 35.3* 34.1* 34.8*  MCV 94.9 93.6 92.7 94.5 93.0  PLT 207 181 183 196 209   Cardiac Enzymes: No results for input(s): CKTOTAL, CKMB, CKMBINDEX, TROPONINI in the last 168 hours. BNP: BNP (last 3 results) No results for input(s): BNP in the last 8760 hours.  ProBNP (last 3 results) No results for input(s): PROBNP in the last 8760 hours.  CBG: Recent Labs  Lab 10/27/19 0629  GLUCAP 95       Signed:  Domenic Polite MD.  [facility] 10/30/2019, 11:18 AM

## 2019-10-30 NOTE — Discharge Summary (Addendum)
Physician Discharge Summary  Brianna Reyes:[id number] DOB: 02/08/1931 DOA: 10/23/2019  PCP: Deland Pretty, MD  Admit date: 10/23/2019 Discharge date: 10/31/2019  Time spent: 58minutes  Recommendations for Outpatient Follow-up:  FU with GI in 1 month Consider Neurology FU for chronic R leg weakness   Discharge Diagnoses:  Principal Problem:   Acute pancreatitis   Paroxysmal AFib-not on antocoagulation   Hypothyroidism   Obstructive sleep apnea   Essential hypertension   DNR   Chronic R leg weakness  Discharge Condition: stable  Diet recommendation: heart healthy  Filed Weights   10/25/19 2014 10/27/19 2028 10/28/19 2051  Weight: 92.8 kg 94.8 kg 94.8 kg    History of present illness:  Brianna Reyes a 86 y.o.femalewith medical history significant ofHTN,paroxysmal A. fib on flecainide and aspirin, hypothyroidism, umbilical hernia, presented to the hospital with sudden onset of abdominal pain. Patient reported her son died last week and she has been feeling very stressful and attributed new onset abdominal pain to the new mentalstress. In the ED,Lipase significantly high out of reference range.LFTs, AST 28, ALT 18, bilirubin 0.8.CT abdomen with contrast showed mild inflammatory changes surrounding the pancreas and duodenum C-loop suggestive of acute pancreatitis with reactive changes of the duodenum. Patient was then admitted to the hospital for further evaluation and treatment.  Hospital Course:   Acute pancreatitis, likely idiopathic focal pancreatitis CT abdomen with mild inflammatory changes. MRCP without obstructive pathology. Initial lipase of more than 10,000 which has trended down to 3000>158 at this time. Lipid panel showed normal triglyceride.  -Treated with supportive care, bowel rest and IV fluids -Clinically improved, tolerating diet -MRCP without acute findings, peripancreatic edema noted along the pancreatic tail -IgG4 was negative -Discharge to [facility],  outpatient follow-up with gastroenterology in 1 to 2 months  Mild hypokalemia. -Replaced  Essential hypertension. Stable - on hydralazine, hydrochlorothiazide, lisinopril and metoprolol at home.Marland Kitchen    Restarted home meds.   Volume depletion. Improved. Off IV fluids at this time. On oral diet.  Paroxysmal A. Fib Continue flecainide and metoprolol. Not on systemic anticoagulationfor personal reasons, patient follows up with cardiology Dr. Ellouise Newer cardiology as outpatient.  -Rate controlled  Anxiety -Improved on as needed Ativan, discouraged long-term use given advanced age -will order QHS prn for few days  Hypothyroidism Continue Synthroid  Hypophosphatemia.  -Replaced  Obstructive sleep apnea. Continue CPAP at nighttime.  CHronic R leg weakness -pt reports that her R leg has been weak for 6-12 months, recommend Neurology FU for this  Code Status: DNR   Discharge Exam: Vitals:   10/31/19 0906 10/31/19 0913  BP: (!) 153/60 (!) 153/60  Pulse: 65 65  Resp:  18  Temp:  97.9 F (36.6 C)  SpO2:  98%    General: AAOx3 Cardiovascular: S1S2/RRR Respiratory: CTAB  Discharge Instructions    Allergies as of 10/31/2019      Reactions   Other Other (See Comments)   CORTISONE INJECTION = Severe dizziness and made her very sick; pt states: "it about killed me"      Medication List    TAKE these medications   ALIVE ENERGY 50+ PO Take by mouth.   Co Q10 200 MG Caps Take 1 capsule by mouth daily.   CRANBERRY PO Take 1 capsule by mouth daily.   flecainide 50 MG tablet Commonly known as: TAMBOCOR TAKE 1 TABLET BY MOUTH TWICE A DAY   hydrALAZINE 25 MG tablet Commonly known as: APRESOLINE TAKE 1 TABLET BY MOUTH THREE  TIMES A DAY   levothyroxine 100 MCG tablet Commonly known as: SYNTHROID Take 100 mcg by mouth daily before breakfast.   lisinopril-hydrochlorothiazide 20-12.5 MG tablet Commonly known as: ZESTORETIC TAKE 1 TABLET BY MOUTH EVERY  DAY   LORazepam 0.5 MG tablet Commonly known as: Ativan Take 1 tablet (0.5 mg total) by mouth at bedtime as needed for up to 10 days for anxiety.   LUTEIN-ZEAXANTHIN PO Take 1 tablet by mouth daily.   metoprolol succinate 50 MG 24 hr tablet Commonly known as: TOPROL-XL Take 50 mg by mouth daily.   multivitamin with minerals Tabs tablet Take 1 tablet by mouth daily. Contains vitamin C   nitroGLYCERIN 0.4 MG SL tablet Commonly known as: NITROSTAT Place 1 tablet (0.4 mg total) under the tongue every 5 (five) minutes as needed for chest pain.   PRESCRIPTION MEDICATION See admin instructions. CPAP-            Durable Medical Equipment  (From admission, onward)         Start     Ordered   10/27/19 1152  For home use only DME lightweight manual wheelchair with seat cushion  Once       Comments: Patient suffers from functional decline which impairs their ability to perform daily activities like adls in the home.  A walker will not resolve  issue with performing activities of daily living. A wheelchair will allow patient to safely perform daily activities. Patient is not able to propel themselves in the home using a standard weight wheelchair due to weakness. Patient can self propel in the lightweight wheelchair. Length of need - lifetime. Accessories: elevating leg rests (ELRs), wheel locks, extensions and anti-tippers.   10/27/19 1206         Allergies  Allergen Reactions  . Other Other (See Comments)    CORTISONE INJECTION = Severe dizziness and made her very sick; pt states: "it about killed me"      The results of significant diagnostics from this hospitalization (including imaging, microbiology, ancillary and laboratory) are listed below for reference.    Significant Diagnostic Studies: DG Chest 2 View  Result Date: 10/23/2019 CLINICAL DATA:  Chest pain EXAM: CHEST - 2 VIEW COMPARISON:  04/05/2014 FINDINGS: Cardiomegaly, mild vascular congestion. Bibasilar  atelectasis. No effusions or overt edema. No acute bony abnormality. IMPRESSION: Cardiomegaly, vascular congestion and bibasilar atelectasis. Electronically Signed   By: Rolm Baptise M.D.   On: 10/23/2019 16:07   CT Abdomen Pelvis W Contrast  Result Date: 10/23/2019 CLINICAL DATA:  86 year old female with abdominal pain. EXAM: CT ABDOMEN AND PELVIS WITH CONTRAST TECHNIQUE: Multidetector CT imaging of the abdomen and pelvis was performed using the standard protocol following bolus administration of intravenous contrast. CONTRAST:  142mL OMNIPAQUE IOHEXOL 300 MG/ML  SOLN COMPARISON:  None. FINDINGS: Lower chest: There are bibasilar linear atelectasis/scarring. There is coronary vascular calcification. No intra-abdominal free air or free fluid. Hepatobiliary: No focal liver abnormality is seen. No gallstones, gallbladder wall thickening, or biliary dilatation. Pancreas: Mild haziness adjacent to the pancreas. Correlation with pancreatic enzymes recommended to evaluate for acute pancreatitis. No drainable fluid collection. Spleen: Normal in size without focal abnormality. Adrenals/Urinary Tract: The adrenal glands unremarkable. There is no hydronephrosis on either side. There is symmetric enhancement and excretion of contrast by both kidneys. There are bilateral renal cysts measure up to 5 cm in the inferior pole of the right kidney. The visualized ureters and urinary bladder appear unremarkable. Stomach/Bowel: There is mild stranding adjacent to the  duodenal C-loop which may be reactive to inflammatory changes of the pancreas or represent enteritis. There are small scattered sigmoid diverticula without active inflammatory changes. There is no bowel obstruction. Appendectomy. Vascular/Lymphatic: There is advanced aortoiliac atherosclerotic disease. The IVC is unremarkable. No portal venous gas. There is no adenopathy. Reproductive: The uterus and ovaries are grossly unremarkable. Other: There is a small fat  containing umbilical hernia. There is slight stranding of the herniated fat. No fluid collection. Musculoskeletal: Osteopenia with degenerative changes of the spine. No acute osseous pathology. IMPRESSION: 1. Mild inflammatory changes surrounding the pancreas and duodenal C-loop. Findings favored to represent acute pancreatitis with reactive changes of the duodenum and less likely duodenitis. Clinical correlation is recommended. 2. Small scattered sigmoid diverticula. No bowel obstruction. 3. Aortic Atherosclerosis (ICD10-I70.0). Electronically Signed   By: Anner Crete M.D.   On: 10/23/2019 17:13   MR ABDOMEN MRCP WO CONTRAST  Result Date: 10/24/2019 CLINICAL DATA:  Elevated lipase levels, clinical suspicion for pancreatitis. EXAM: MRI ABDOMEN WITHOUT CONTRAST  (INCLUDING MRCP) TECHNIQUE: Multiplanar multisequence MR imaging of the abdomen was performed. Heavily T2-weighted images of the biliary and pancreatic ducts were obtained, and three-dimensional MRCP images were rendered by post processing. COMPARISON:  CT abdomen 10/23/2019 FINDINGS: Despite efforts by the technologist and patient, motion artifact is present on today's exam and could not be eliminated. This reduces exam sensitivity and specificity. Lower chest: Increasing dependent atelectasis in both lower lobes. Mild cardiomegaly. Trace right pleural effusion, new compared to yesterday. Hepatobiliary: Unremarkable.  No biliary dilatation. Pancreas: Peripancreatic edema, most notable along the pancreatic tail, probably a manifestation of pancreatitis. No dorsal pancreatic duct dilatation. Normal pancreatic duct branching pattern. Spleen:  Unremarkable Adrenals/Urinary Tract: Bosniak category 2 cyst of the right kidney lower pole. Additional benign bilateral renal cysts. Adrenal glands unremarkable. Stomach/Bowel: Unremarkable Vascular/Lymphatic: Aortoiliac atherosclerotic vascular disease. No pathologic adenopathy identified. Other:  Trace  perihepatic and perisplenic ascites. Musculoskeletal: Lumbar spondylosis and degenerative disc disease. Mild dextroconvex lower thoracic scoliosis and mild levoconvex lumbar scoliosis with rotary component. IMPRESSION: 1. Peripancreatic edema, most notable along the pancreatic tail, probably a manifestation of pancreatitis. No dorsal pancreatic duct dilatation. No appreciable abscess or pseudocyst. 2. No biliary dilatation. 3. Trace perihepatic and perisplenic ascites. 4. Increasing dependent atelectasis in both lower lobes. Trace right pleural effusion. 5. Lumbar spondylosis and degenerative disc disease. 6.  Aortic Atherosclerosis (ICD10-I70.0). Electronically Signed   By: Van Clines M.D.   On: 10/24/2019 15:37    Microbiology: Recent Results (from the past 240 hour(s))  SARS Coronavirus 2 by RT PCR (hospital order, performed in [facility] hospital lab) Nasopharyngeal Nasopharyngeal Swab     Status: None   Collection Time: 10/23/19  5:42 PM   Specimen: Nasopharyngeal Swab  Result Value Ref Range Status   SARS Coronavirus 2 NEGATIVE NEGATIVE Final    Comment: (NOTE) SARS-CoV-2 target nucleic acids are NOT DETECTED.  The SARS-CoV-2 RNA is generally detectable in upper and lower respiratory specimens during the acute phase of infection. The lowest concentration of SARS-CoV-2 viral copies this assay can detect is 250 copies / mL. A negative result does not preclude SARS-CoV-2 infection and should not be used as the sole basis for treatment or other patient management decisions.  A negative result may occur with improper specimen collection / handling, submission of specimen other than nasopharyngeal swab, presence of viral mutation(s) within the areas targeted by this assay, and inadequate number of viral copies (<250 copies / mL). A negative result must be  combined with clinical observations, patient history, and epidemiological information.  Fact Sheet for Patients:    StrictlyIdeas.no  Fact Sheet for Healthcare Providers: BankingDealers.co.za  This test is not yet approved or  cleared by the [place] FDA and has been authorized for detection and/or diagnosis of SARS-CoV-2 by FDA under an Emergency Use Authorization (EUA).  This EUA will remain in effect (meaning this test can be used) for the duration of the COVID-19 declaration under Section 564(b)(1) of the Act, 21 U.S.C. section 360bbb-3(b)(1), unless the authorization is terminated or revoked sooner.  Performed at [facility], [place] [address], [place], [place] [address]      Labs: Basic Metabolic Panel: Recent Labs  Lab 10/25/19 0455 10/26/19 0342 10/27/19 0300 10/28/19 0616 10/29/19 0652  NA 136 135 138 139 138  K 3.3* 3.6 3.7 3.7 3.7  CL 102 102 103 103 104  CO2 24 23 25 29 27   GLUCOSE 76 87 115* 109* 96  BUN 14 11 7* 8 8  CREATININE 0.89 0.73 0.69 0.73 0.66  CALCIUM 8.8* 8.6* 8.6* 8.8* 8.7*  MG 1.8 1.8 1.7 2.0 1.8  PHOS 1.9* 2.1* 2.2*  --  3.4   Liver Function Tests: Recent Labs  Lab 10/25/19 0455 10/27/19 0300 10/29/19 0652  AST 44* 35 27  ALT 22 22 20   ALKPHOS 46 47 43  BILITOT 1.2 1.2 1.1  PROT 6.3* 5.7* 5.4*  ALBUMIN 3.2* 2.8* 2.5*   Recent Labs  Lab 10/25/19 0455  LIPASE 158*   No results for input(s): AMMONIA in the last 168 hours. CBC: Recent Labs  Lab 10/25/19 0455 10/26/19 0342 10/27/19 0300 10/28/19 0616 10/29/19 0652  WBC 10.2 9.5 7.4 6.5 6.5  HGB 12.3 11.8* 11.5* 11.0* 11.5*  HCT 39.4 36.8 35.3* 34.1* 34.8*  MCV 94.9 93.6 92.7 94.5 93.0  PLT 207 181 183 196 209   Cardiac Enzymes: No results for input(s): CKTOTAL, CKMB, CKMBINDEX, TROPONINI in the last 168 hours. BNP: BNP (last 3 results) No results for input(s): BNP in the last 8760 hours.  ProBNP (last 3 results) No results for input(s): PROBNP in the last 8760 hours.  CBG: Recent Labs  Lab 10/27/19 0629  GLUCAP 95        Signed:  Domenic Polite MD.  [facility] 10/31/2019, 10:51 AM

## 2019-10-31 DIAGNOSIS — G3184 Mild cognitive impairment, so stated: Secondary | ICD-10-CM | POA: Diagnosis not present

## 2019-10-31 DIAGNOSIS — G4733 Obstructive sleep apnea (adult) (pediatric): Secondary | ICD-10-CM | POA: Diagnosis not present

## 2019-10-31 DIAGNOSIS — K8591 Acute pancreatitis with uninfected necrosis, unspecified: Secondary | ICD-10-CM | POA: Diagnosis not present

## 2019-10-31 DIAGNOSIS — Z7401 Bed confinement status: Secondary | ICD-10-CM | POA: Diagnosis not present

## 2019-10-31 DIAGNOSIS — R092 Respiratory arrest: Secondary | ICD-10-CM | POA: Diagnosis not present

## 2019-10-31 DIAGNOSIS — Z03818 Encounter for observation for suspected exposure to other biological agents ruled out: Secondary | ICD-10-CM | POA: Diagnosis not present

## 2019-10-31 DIAGNOSIS — I10 Essential (primary) hypertension: Secondary | ICD-10-CM | POA: Diagnosis not present

## 2019-10-31 DIAGNOSIS — M1990 Unspecified osteoarthritis, unspecified site: Secondary | ICD-10-CM | POA: Diagnosis not present

## 2019-10-31 DIAGNOSIS — M6281 Muscle weakness (generalized): Secondary | ICD-10-CM | POA: Diagnosis not present

## 2019-10-31 DIAGNOSIS — I480 Paroxysmal atrial fibrillation: Secondary | ICD-10-CM | POA: Diagnosis not present

## 2019-10-31 DIAGNOSIS — K859 Acute pancreatitis without necrosis or infection, unspecified: Secondary | ICD-10-CM | POA: Diagnosis not present

## 2019-10-31 DIAGNOSIS — E039 Hypothyroidism, unspecified: Secondary | ICD-10-CM | POA: Diagnosis not present

## 2019-10-31 DIAGNOSIS — Z743 Need for continuous supervision: Secondary | ICD-10-CM | POA: Diagnosis not present

## 2019-10-31 DIAGNOSIS — M2550 Pain in unspecified joint: Secondary | ICD-10-CM | POA: Diagnosis not present

## 2019-10-31 LAB — SARS CORONAVIRUS 2 BY RT PCR (HOSPITAL ORDER, PERFORMED IN ~~LOC~~ HOSPITAL LAB): SARS Coronavirus 2: NEGATIVE

## 2019-10-31 MED ORDER — HYDRALAZINE HCL 25 MG PO TABS
25.0000 mg | ORAL_TABLET | Freq: Three times a day (TID) | ORAL | Status: DC
  Administered 2019-10-31 (×2): 25 mg via ORAL
  Filled 2019-10-31 (×2): qty 1

## 2019-10-31 MED ORDER — LORAZEPAM 0.5 MG PO TABS
0.5000 mg | ORAL_TABLET | Freq: Every evening | ORAL | 0 refills | Status: AC | PRN
Start: 2019-10-31 — End: 2019-11-10

## 2019-10-31 NOTE — Progress Notes (Signed)
DISCHARGE NOTE SNF [name] to be discharged [place], [place] per MD order. Patient verbalized understanding.  Skin clean, dry and intact without evidence of skin break down, no evidence of skin tears noted. IV catheter discontinued intact. Site without signs and symptoms of complications. Dressing and pressure applied. Pt denies pain at the site currently. No complaints noted.  Patient free of lines, drains, and wounds.   Discharge packet assembled. An After Visit Summary (AVS) was printed and given to the EMS personnel. Patient escorted via stretcher and discharged to [facility] via ambulance. Report called to accepting facility Cathi Roan RN); all questions and concerns addressed.   Berneta Levins, RN

## 2019-10-31 NOTE — TOC Transition Note (Signed)
Transition of Care [facility]) - CM/SW Discharge Note *Discharged to [facility]  Patient Details  Name: Brianna Reyes MRN: [id number] Date of Birth: [birth year]  Transition of Care [facility]) CM/SW Contact:  Sable Feil, LCSW Phone Number: 10/31/2019, 1:09 PM   Clinical Narrative:  Patient medically stable for discharge and insurance auth received 10/31/19. COVID test resulted negative. Discharge summary transmitted to facility and daughter/patient advised of receipt of insurance auth and ambulance transport arranged.     Final next level of care: [facility] ([place], [place]) Barriers to Discharge: Barriers Resolved   Patient Goals and CMS Choice Patient states their goals for this hospitalization and ongoing recovery are:: Goal is to get stronger and return home CMS Medicare.gov Compare Post Acute Care list provided to:: Other (Comment Required) (Daughter chose facility in [place], [place]) Choice offered to / list presented to : Adult Children  Discharge Placement PASRR number recieved: 10/27/19            Patient chooses bed at: Other - please specify in the comment section below: [facility] [place], [place]) Patient to be transferred to facility by: non-emergency ambulance transport Name of family member notified: Daughter [name] Patient and family notified of of transfer: 10/31/19  Discharge Plan and Services In-house Referral: [facility] Discharge Planning Services: CM Consult Post Acute Care Choice: Home Health, Durable Medical Equipment          DME Arranged: Wheelchair manual DME Agency: [employer] Date DME Agency Contacted: 10/27/19 Time DME Agency Contacted: 1324 Representative spoke with at DME Agency: Thedore Mins HH Arranged: PT, OT, Social Work         Social Determinants of Health ([place]) Interventions  No SDOH interventions requested or needed at discharge   Readmission Risk  Interventions No flowsheet data found.

## 2019-10-31 NOTE — Plan of Care (Signed)

## 2019-11-06 DIAGNOSIS — G3184 Mild cognitive impairment, so stated: Secondary | ICD-10-CM | POA: Diagnosis not present

## 2019-11-06 DIAGNOSIS — I480 Paroxysmal atrial fibrillation: Secondary | ICD-10-CM | POA: Diagnosis not present

## 2019-11-06 DIAGNOSIS — K8591 Acute pancreatitis with uninfected necrosis, unspecified: Secondary | ICD-10-CM | POA: Diagnosis not present

## 2019-11-06 DIAGNOSIS — I10 Essential (primary) hypertension: Secondary | ICD-10-CM | POA: Diagnosis not present

## 2019-11-07 DIAGNOSIS — I10 Essential (primary) hypertension: Secondary | ICD-10-CM | POA: Diagnosis not present

## 2019-11-07 DIAGNOSIS — K859 Acute pancreatitis without necrosis or infection, unspecified: Secondary | ICD-10-CM | POA: Diagnosis not present

## 2019-11-07 DIAGNOSIS — M6281 Muscle weakness (generalized): Secondary | ICD-10-CM | POA: Diagnosis not present

## 2019-11-07 DIAGNOSIS — E039 Hypothyroidism, unspecified: Secondary | ICD-10-CM | POA: Diagnosis not present

## 2019-11-07 DIAGNOSIS — I480 Paroxysmal atrial fibrillation: Secondary | ICD-10-CM | POA: Diagnosis not present

## 2019-11-10 DIAGNOSIS — I480 Paroxysmal atrial fibrillation: Secondary | ICD-10-CM | POA: Diagnosis not present

## 2019-11-10 DIAGNOSIS — Z03818 Encounter for observation for suspected exposure to other biological agents ruled out: Secondary | ICD-10-CM | POA: Diagnosis not present

## 2019-11-10 DIAGNOSIS — M6281 Muscle weakness (generalized): Secondary | ICD-10-CM | POA: Diagnosis not present

## 2019-11-10 DIAGNOSIS — K859 Acute pancreatitis without necrosis or infection, unspecified: Secondary | ICD-10-CM | POA: Diagnosis not present

## 2019-11-10 DIAGNOSIS — I10 Essential (primary) hypertension: Secondary | ICD-10-CM | POA: Diagnosis not present

## 2019-11-24 DIAGNOSIS — I10 Essential (primary) hypertension: Secondary | ICD-10-CM | POA: Diagnosis not present

## 2019-11-24 DIAGNOSIS — E039 Hypothyroidism, unspecified: Secondary | ICD-10-CM | POA: Diagnosis not present

## 2019-11-24 DIAGNOSIS — G4733 Obstructive sleep apnea (adult) (pediatric): Secondary | ICD-10-CM | POA: Diagnosis not present

## 2019-11-24 DIAGNOSIS — Z9181 History of falling: Secondary | ICD-10-CM | POA: Diagnosis not present

## 2019-11-24 DIAGNOSIS — K859 Acute pancreatitis without necrosis or infection, unspecified: Secondary | ICD-10-CM | POA: Diagnosis not present

## 2019-11-24 DIAGNOSIS — M6281 Muscle weakness (generalized): Secondary | ICD-10-CM | POA: Diagnosis not present

## 2019-11-24 DIAGNOSIS — I480 Paroxysmal atrial fibrillation: Secondary | ICD-10-CM | POA: Diagnosis not present

## 2019-11-24 DIAGNOSIS — M1990 Unspecified osteoarthritis, unspecified site: Secondary | ICD-10-CM | POA: Diagnosis not present

## 2019-11-25 DIAGNOSIS — Z9181 History of falling: Secondary | ICD-10-CM | POA: Diagnosis not present

## 2019-11-25 DIAGNOSIS — M6281 Muscle weakness (generalized): Secondary | ICD-10-CM | POA: Diagnosis not present

## 2019-11-25 DIAGNOSIS — M1990 Unspecified osteoarthritis, unspecified site: Secondary | ICD-10-CM | POA: Diagnosis not present

## 2019-11-25 DIAGNOSIS — I480 Paroxysmal atrial fibrillation: Secondary | ICD-10-CM | POA: Diagnosis not present

## 2019-11-25 DIAGNOSIS — E039 Hypothyroidism, unspecified: Secondary | ICD-10-CM | POA: Diagnosis not present

## 2019-11-25 DIAGNOSIS — K859 Acute pancreatitis without necrosis or infection, unspecified: Secondary | ICD-10-CM | POA: Diagnosis not present

## 2019-11-25 DIAGNOSIS — I10 Essential (primary) hypertension: Secondary | ICD-10-CM | POA: Diagnosis not present

## 2019-11-25 DIAGNOSIS — G4733 Obstructive sleep apnea (adult) (pediatric): Secondary | ICD-10-CM | POA: Diagnosis not present

## 2019-12-01 DIAGNOSIS — G4733 Obstructive sleep apnea (adult) (pediatric): Secondary | ICD-10-CM | POA: Diagnosis not present

## 2019-12-09 DIAGNOSIS — K859 Acute pancreatitis without necrosis or infection, unspecified: Secondary | ICD-10-CM | POA: Diagnosis not present

## 2019-12-09 DIAGNOSIS — M1990 Unspecified osteoarthritis, unspecified site: Secondary | ICD-10-CM | POA: Diagnosis not present

## 2019-12-09 DIAGNOSIS — M6281 Muscle weakness (generalized): Secondary | ICD-10-CM | POA: Diagnosis not present

## 2019-12-09 DIAGNOSIS — G4733 Obstructive sleep apnea (adult) (pediatric): Secondary | ICD-10-CM | POA: Diagnosis not present

## 2019-12-09 DIAGNOSIS — Z9181 History of falling: Secondary | ICD-10-CM | POA: Diagnosis not present

## 2019-12-09 DIAGNOSIS — E039 Hypothyroidism, unspecified: Secondary | ICD-10-CM | POA: Diagnosis not present

## 2019-12-09 DIAGNOSIS — I10 Essential (primary) hypertension: Secondary | ICD-10-CM | POA: Diagnosis not present

## 2019-12-09 DIAGNOSIS — I480 Paroxysmal atrial fibrillation: Secondary | ICD-10-CM | POA: Diagnosis not present

## 2019-12-10 DIAGNOSIS — M6281 Muscle weakness (generalized): Secondary | ICD-10-CM | POA: Diagnosis not present

## 2019-12-10 DIAGNOSIS — K859 Acute pancreatitis without necrosis or infection, unspecified: Secondary | ICD-10-CM | POA: Diagnosis not present

## 2019-12-10 DIAGNOSIS — G4733 Obstructive sleep apnea (adult) (pediatric): Secondary | ICD-10-CM | POA: Diagnosis not present

## 2019-12-10 DIAGNOSIS — I10 Essential (primary) hypertension: Secondary | ICD-10-CM | POA: Diagnosis not present

## 2019-12-10 DIAGNOSIS — M1990 Unspecified osteoarthritis, unspecified site: Secondary | ICD-10-CM | POA: Diagnosis not present

## 2019-12-10 DIAGNOSIS — I480 Paroxysmal atrial fibrillation: Secondary | ICD-10-CM | POA: Diagnosis not present

## 2019-12-10 DIAGNOSIS — E039 Hypothyroidism, unspecified: Secondary | ICD-10-CM | POA: Diagnosis not present

## 2019-12-10 DIAGNOSIS — Z9181 History of falling: Secondary | ICD-10-CM | POA: Diagnosis not present

## 2019-12-15 DIAGNOSIS — E039 Hypothyroidism, unspecified: Secondary | ICD-10-CM | POA: Diagnosis not present

## 2019-12-15 DIAGNOSIS — I10 Essential (primary) hypertension: Secondary | ICD-10-CM | POA: Diagnosis not present

## 2019-12-15 DIAGNOSIS — K859 Acute pancreatitis without necrosis or infection, unspecified: Secondary | ICD-10-CM | POA: Diagnosis not present

## 2019-12-15 DIAGNOSIS — Z9181 History of falling: Secondary | ICD-10-CM | POA: Diagnosis not present

## 2019-12-15 DIAGNOSIS — G4733 Obstructive sleep apnea (adult) (pediatric): Secondary | ICD-10-CM | POA: Diagnosis not present

## 2019-12-15 DIAGNOSIS — M1990 Unspecified osteoarthritis, unspecified site: Secondary | ICD-10-CM | POA: Diagnosis not present

## 2019-12-15 DIAGNOSIS — M6281 Muscle weakness (generalized): Secondary | ICD-10-CM | POA: Diagnosis not present

## 2019-12-15 DIAGNOSIS — I480 Paroxysmal atrial fibrillation: Secondary | ICD-10-CM | POA: Diagnosis not present

## 2019-12-24 DIAGNOSIS — K859 Acute pancreatitis without necrosis or infection, unspecified: Secondary | ICD-10-CM | POA: Diagnosis not present

## 2019-12-24 DIAGNOSIS — E039 Hypothyroidism, unspecified: Secondary | ICD-10-CM | POA: Diagnosis not present

## 2019-12-24 DIAGNOSIS — G4733 Obstructive sleep apnea (adult) (pediatric): Secondary | ICD-10-CM | POA: Diagnosis not present

## 2019-12-24 DIAGNOSIS — M6281 Muscle weakness (generalized): Secondary | ICD-10-CM | POA: Diagnosis not present

## 2019-12-24 DIAGNOSIS — I10 Essential (primary) hypertension: Secondary | ICD-10-CM | POA: Diagnosis not present

## 2019-12-24 DIAGNOSIS — M1990 Unspecified osteoarthritis, unspecified site: Secondary | ICD-10-CM | POA: Diagnosis not present

## 2019-12-24 DIAGNOSIS — Z9181 History of falling: Secondary | ICD-10-CM | POA: Diagnosis not present

## 2019-12-24 DIAGNOSIS — I480 Paroxysmal atrial fibrillation: Secondary | ICD-10-CM | POA: Diagnosis not present

## 2020-01-01 DIAGNOSIS — K859 Acute pancreatitis without necrosis or infection, unspecified: Secondary | ICD-10-CM | POA: Diagnosis not present

## 2020-01-01 DIAGNOSIS — M6281 Muscle weakness (generalized): Secondary | ICD-10-CM | POA: Diagnosis not present

## 2020-01-01 DIAGNOSIS — I480 Paroxysmal atrial fibrillation: Secondary | ICD-10-CM | POA: Diagnosis not present

## 2020-01-01 DIAGNOSIS — I10 Essential (primary) hypertension: Secondary | ICD-10-CM | POA: Diagnosis not present

## 2020-01-07 ENCOUNTER — Telehealth (INDEPENDENT_AMBULATORY_CARE_PROVIDER_SITE_OTHER): Payer: Medicare Other | Admitting: Internal Medicine

## 2020-01-07 ENCOUNTER — Other Ambulatory Visit: Payer: Self-pay

## 2020-01-07 VITALS — BP 158/73 | HR 55 | Ht 62.0 in | Wt 188.0 lb

## 2020-01-07 DIAGNOSIS — I480 Paroxysmal atrial fibrillation: Secondary | ICD-10-CM

## 2020-01-07 DIAGNOSIS — I10 Essential (primary) hypertension: Secondary | ICD-10-CM | POA: Diagnosis not present

## 2020-01-07 DIAGNOSIS — G4733 Obstructive sleep apnea (adult) (pediatric): Secondary | ICD-10-CM

## 2020-01-07 DIAGNOSIS — Z9989 Dependence on other enabling machines and devices: Secondary | ICD-10-CM

## 2020-01-07 NOTE — Progress Notes (Signed)
Electrophysiology TeleHealth Note  Due to national recommendations of social distancing due to [place] 19, an audio telehealth visit is felt to be most appropriate for this patient at this time.  Verbal consent was obtained by me for the telehealth visit today.  The patient does not have capability for a virtual visit.  A phone visit is therefore required today.   Date:  01/07/2020   ID:  [name], DOB 10-09-1930, MRN [id number]  Location: patient's home  Provider location:  [place]  Evaluation Performed: Follow-up visit  PCP:  Deland Pretty, MD   Electrophysiologist:  Dr Rayann Heman  Chief Complaint:  palpitations  History of Present Illness:    LUCELIA LACEY is a 86 y.o. female who presents via telehealth conferencing today.  Since last being seen in our clinic, the patient reports doing very well.  Her afib is controlled with flecainide.  She did have acute pancreatitis in August while grieving her son's death.  Today, she denies symptoms of palpitations, chest pain, shortness of breath,  lower extremity edema, dizziness, presyncope, or syncope.  The patient is otherwise without complaint today.     Past Medical History:  Diagnosis Date   Arthritis    Brain tumor [facility])    Essential hypertension    a. 10/2011 Echo: EF >55%; b. 04/2014 Echo: EF 60-65%, triv AI, mildly dil RA.   Hypothyroidism    Meningioma ([place])    Palpitations    a. 10/2011 Cardionet; b. 02/2014 Cardionet: sinus rhythm, occas unifocal isolated PVC's, no signif brady/tachy.   Pedal edema    Sleep apnea 04/22/08   SLEEP STUDY done @ [facility]. Read by Dr. Keturah Barre. Repeat study done 05/22/11 @ [facility].    Past Surgical History:  Procedure Laterality Date   APPENDECTOMY     TONSILLECTOMY     TUBAL LIGATION      Current Outpatient Medications  Medication Sig Dispense Refill   Coenzyme Q10 (CO Q10) 200 MG CAPS Take 1 capsule by mouth daily.     CRANBERRY PO Take  1 capsule by mouth daily.     flecainide (TAMBOCOR) 50 MG tablet TAKE 1 TABLET BY MOUTH TWICE A DAY (Patient taking differently: Take 50 mg by mouth 2 (two) times daily. ) 180 tablet 3   hydrALAZINE (APRESOLINE) 25 MG tablet TAKE 1 TABLET BY MOUTH THREE TIMES A DAY (Patient taking differently: Take 25 mg by mouth 3 (three) times daily. ) 270 tablet 3   levothyroxine (SYNTHROID, LEVOTHROID) 100 MCG tablet Take 100 mcg by mouth daily before breakfast.   1   lisinopril-hydrochlorothiazide (ZESTORETIC) 20-12.5 MG tablet TAKE 1 TABLET BY MOUTH EVERY DAY (Patient taking differently: Take 1 tablet by mouth daily. ) 90 tablet 3   LUTEIN-ZEAXANTHIN PO Take 1 tablet by mouth daily.     metoprolol succinate (TOPROL-XL) 50 MG 24 hr tablet Take 50 mg by mouth daily.     Multiple Vitamin (MULTIVITAMIN WITH MINERALS) TABS tablet Take 1 tablet by mouth daily. Contains vitamin C     Multiple Vitamins-Minerals (ALIVE ENERGY 50+ PO) Take by mouth.     PRESCRIPTION MEDICATION See admin instructions. CPAP-     nitroGLYCERIN (NITROSTAT) 0.4 MG SL tablet Place 1 tablet (0.4 mg total) under the tongue every 5 (five) minutes as needed for chest pain. 25 tablet 3   No current facility-administered medications for this visit.    Allergies:   Other   Social History:  The patient  reports that she has never smoked. She has never used smokeless tobacco. She reports that she does not drink alcohol and does not use drugs.   ROS:  Please see the history of present illness.   All other systems are personally reviewed and negative.    Exam:    Vital Signs:  BP (!) 158/73    Pulse (!) 55    Ht 5\' 2"  (1.575 m)    Wt 188 lb (85.3 kg)    BMI 34.39 kg/m   Well sounding, alert and conversant   Labs/Other Tests and Data Reviewed:    Recent Labs: 10/29/2019: ALT 20; BUN 8; Creatinine, Ser 0.66; Hemoglobin 11.5; Magnesium 1.8; Platelets 209; Potassium 3.7; Sodium 138   Wt Readings from Last 3 Encounters:  01/07/20  188 lb (85.3 kg)  10/28/19 209 lb (94.8 kg)  04/07/19 189 lb (85.7 kg)     ekg 10/24/19- sinus rhythm 58 bpm, PR 190 msec, QRS 102 msec, QTc 442 msec   ASSESSMENT & PLAN:    1.  Paroxysmal atrial fibrillation Doing well with flecainide chads2vasc score is 4.  She declines [place] We will need to follow her closely on flecainide to avoid toxicity.  2. HTN Stable No change required today  3. OSA Follows with Dr Claiborne Billings Reports compliance with CPAP   Risks, benefits and potential toxicities for medications prescribed and/or refilled reviewed with patient today.   Follow-up:  with me in a year   Patient Risk:  after full review of this patients clinical status, I feel that they are at moderate risk at this time.  Today, I have spent 15 minutes with the patient with telehealth technology discussing arrhythmia management .    Army Fossa, MD  01/07/2020 10:12 AM     [facility] [address] [place]  [place] [address] [phone] (office) [phone] (fax)

## 2020-02-18 ENCOUNTER — Other Ambulatory Visit: Payer: Self-pay | Admitting: Cardiovascular Disease

## 2020-04-22 ENCOUNTER — Other Ambulatory Visit: Payer: Self-pay | Admitting: Cardiovascular Disease

## 2020-07-06 DIAGNOSIS — G4733 Obstructive sleep apnea (adult) (pediatric): Secondary | ICD-10-CM | POA: Diagnosis not present

## 2020-07-23 ENCOUNTER — Ambulatory Visit: Payer: Medicare Other | Admitting: Internal Medicine

## 2020-07-23 DIAGNOSIS — I480 Paroxysmal atrial fibrillation: Secondary | ICD-10-CM

## 2020-07-23 DIAGNOSIS — I10 Essential (primary) hypertension: Secondary | ICD-10-CM

## 2020-07-23 DIAGNOSIS — G4733 Obstructive sleep apnea (adult) (pediatric): Secondary | ICD-10-CM

## 2021-10-24 IMAGING — MR MR MRCP
10 of 13 series · 39 of 48 positions shown · non-contrast
Comparison: CT abdomen 10/23/2019

CLINICAL DATA: Elevated lipase levels, clinical suspicion for
pancreatitis.

EXAM:
MRI ABDOMEN WITHOUT CONTRAST  (INCLUDING MRCP)
TECHNIQUE: Multiplanar multisequence MR imaging of the abdomen was performed.
Heavily T2-weighted images of the biliary and pancreatic ducts were
obtained, and three-dimensional MRCP images were rendered by post
processing.

[Series 4: cor haste · coronal · 6.0mm · 1.19mm/px · 2 of 32 slices shown]
[im 1/32]
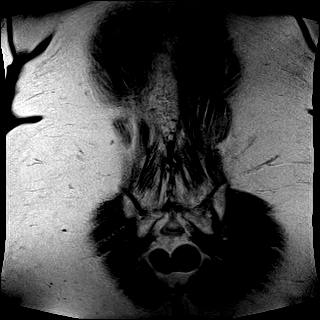
[im 32/32]
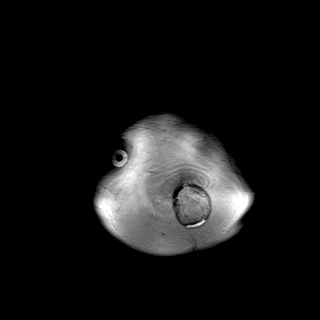

[Series 5: ax haste · axial · 6.0mm · 1.19mm/px · z∈[-245,-22]mm · 2 of 32 slices shown]
[im 1/32]
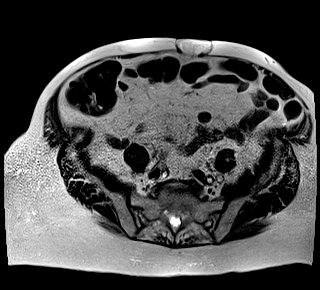
[im 32/32]
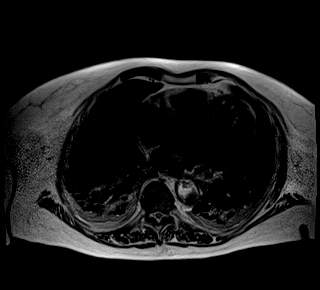

[Series 8: T2 fat-sat · axial · 6.0mm · 1.19mm/px · z∈[-245,-22]mm · 3 of 32 slices shown]
[im 1/32]
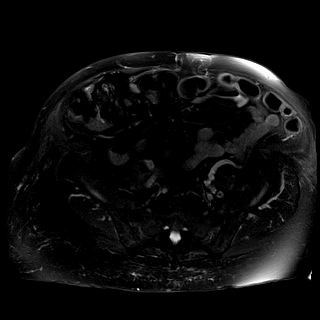
[im 16/32]
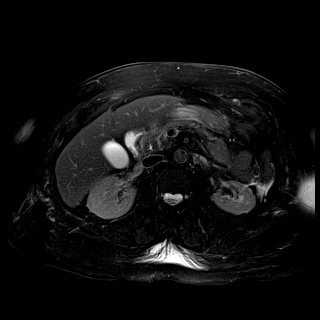
[im 32/32]
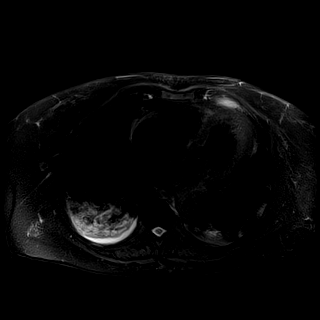

[Series 13: MRCP · coronal · 4.0mm · 1.12mm/px · 1 of 15 slices shown]
[im 1/15]
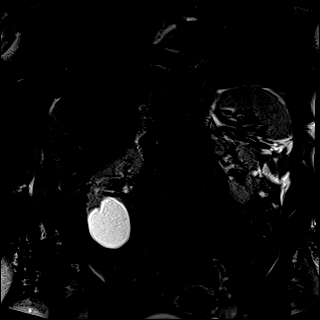

[Series 14: DWI · axial · 6.0mm · 1.42mm/px · z∈[-245,-22]mm · 9 of 96 slices shown (1 of 2)]
[im 1/96]
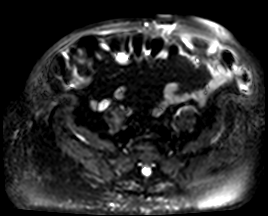
[im 12/96]
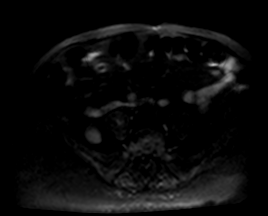
[im 24/96]
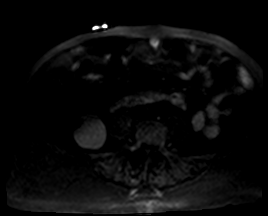
[im 36/96]
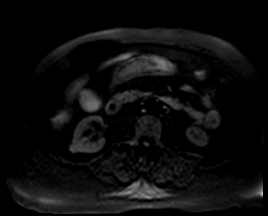
[im 48/96]
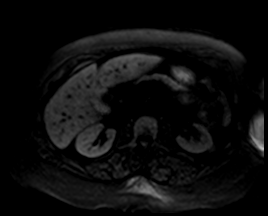
[im 60/96]
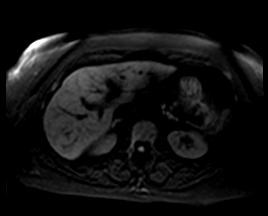
[im 72/96]
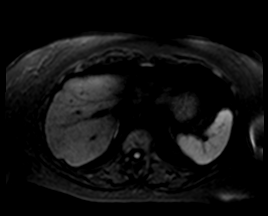
[im 84/96]
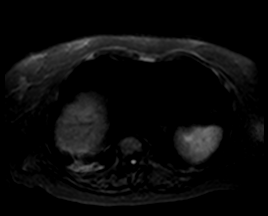
[im 96/96]
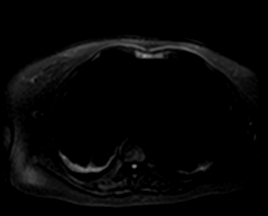

[Series 15: DWI · axial · 6.0mm · 1.42mm/px · z∈[-245,-22]mm · 3 of 32 slices shown (2 of 2)]
[im 1/32]
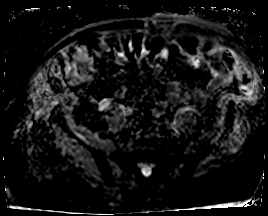
[im 16/32]
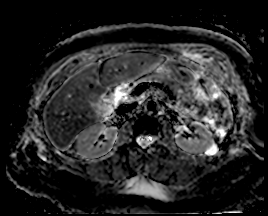
[im 32/32]
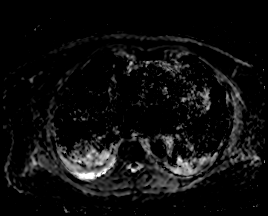

[Series 16: t1_vibe_opp-in_tra_p4_bh · axial · 3.0mm · 1.19mm/px · z∈[-243,-6]mm · 7 of 80 slices shown (1 of 2)]
[im 1/80]
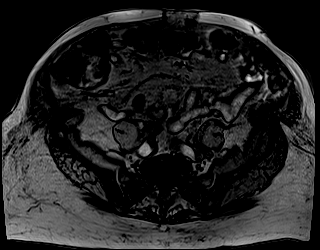
[im 14/80]
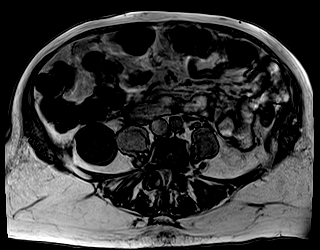
[im 27/80]
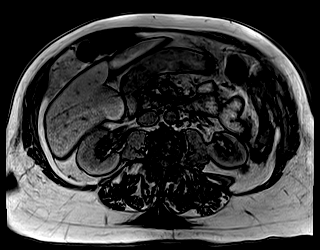
[im 40/80]
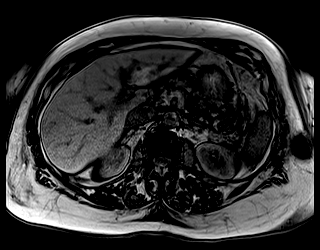
[im 53/80]
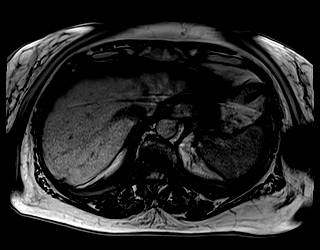
[im 66/80]
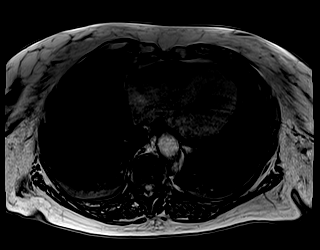
[im 80/80]
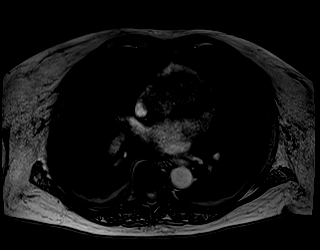

[Series 16: t1_vibe_opp-in_tra_p4_bh · axial · 3.0mm · 1.19mm/px · z∈[-243,-6]mm · 7 of 80 slices shown (2 of 2)]
[im 1/80]
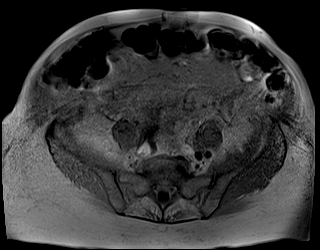
[im 14/80]
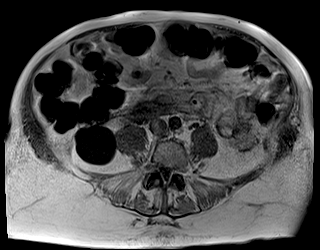
[im 27/80]
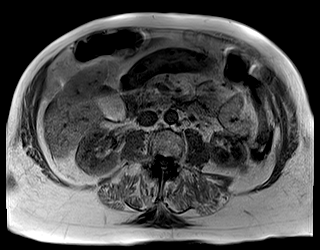
[im 40/80]
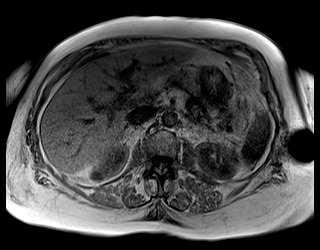
[im 53/80]
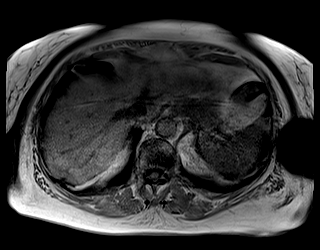
[im 66/80]
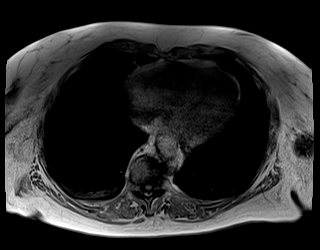
[im 80/80]
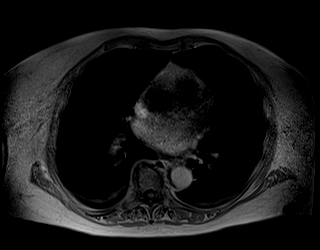

[Series 17: radials · coronal · 50.0mm · 0.78mm/px · 1 of 5 slices shown]
[im 1/5]
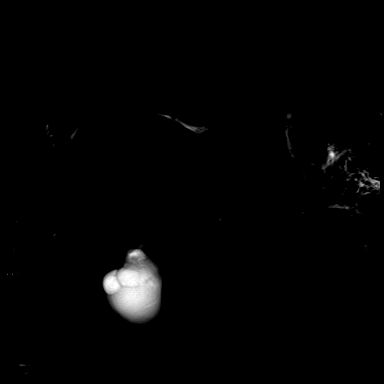

[Series 18: bSSFP · axial · 6.0mm · 0.74mm/px · z∈[-242,-8]mm · 4 of 40 slices shown]
[im 1/40]
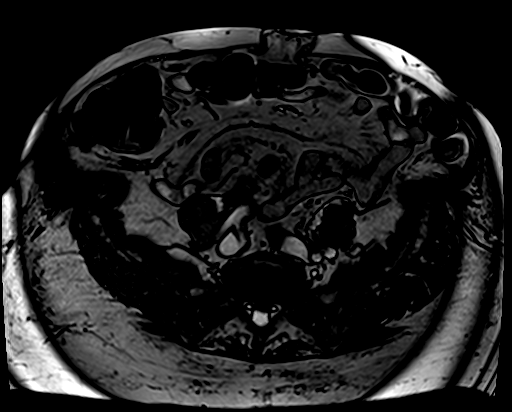
[im 14/40]
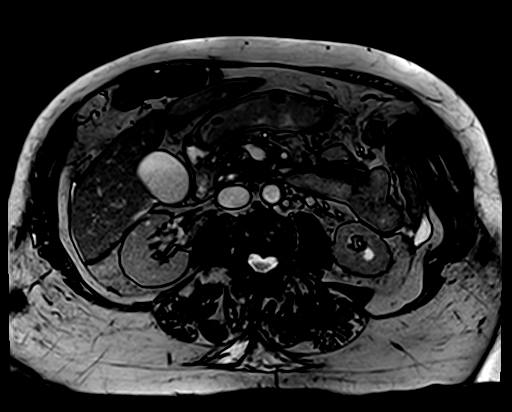
[im 27/40]
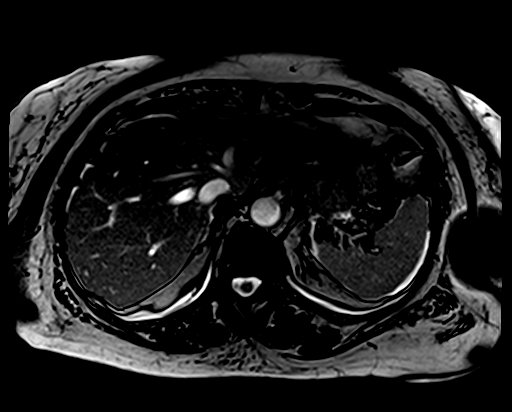
[im 40/40]
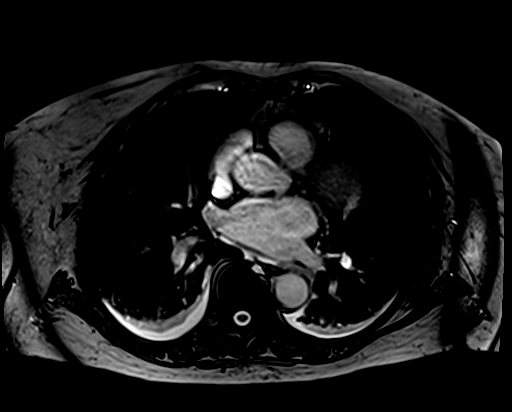

[39 of 48 positions shown; findings below may reference images not displayed]

FINDINGS: Despite efforts by the technologist and patient, motion artifact is
present on today's exam and could not be eliminated. This reduces
exam sensitivity and specificity.

Lower chest: Increasing dependent atelectasis in both lower lobes.
Mild cardiomegaly. Trace right pleural effusion, new compared to
yesterday.

Hepatobiliary: Unremarkable.  No biliary dilatation.

Pancreas: Peripancreatic edema, most notable along the pancreatic
tail, probably a manifestation of pancreatitis. No dorsal pancreatic
duct dilatation. Normal pancreatic duct branching pattern.

Spleen:  Unremarkable

Adrenals/Urinary Tract: Bosniak category 2 cyst of the right kidney
lower pole. Additional benign bilateral renal cysts. Adrenal glands
unremarkable.

Stomach/Bowel: Unremarkable

Vascular/Lymphatic: Aortoiliac atherosclerotic vascular disease. No
pathologic adenopathy identified.

Other:  Trace perihepatic and perisplenic ascites.

Musculoskeletal: Lumbar spondylosis and degenerative disc disease.
Mild dextroconvex lower thoracic scoliosis and mild levoconvex
lumbar scoliosis with rotary component.
IMPRESSION: 1. Peripancreatic edema, most notable along the pancreatic tail,
probably a manifestation of pancreatitis. No dorsal pancreatic duct
dilatation. No appreciable abscess or pseudocyst.
2. No biliary dilatation.
3. Trace perihepatic and perisplenic ascites.
4. Increasing dependent atelectasis in both lower lobes. Trace right
pleural effusion.
5. Lumbar spondylosis and degenerative disc disease.
6.  Aortic Atherosclerosis ([id number]).
